# Patient Record
Sex: Female | Born: 1945 | Race: White | Hispanic: No | Smoking: Never smoker
Health system: Southern US, Community
[De-identification: ages and names within clinical notes are randomized; demographics above are authoritative.]

## PROBLEM LIST (undated history)

## (undated) DIAGNOSIS — A048 Other specified bacterial intestinal infections: Secondary | ICD-10-CM

## (undated) DIAGNOSIS — E119 Type 2 diabetes mellitus without complications: Secondary | ICD-10-CM

## (undated) DIAGNOSIS — H269 Unspecified cataract: Secondary | ICD-10-CM

## (undated) DIAGNOSIS — K862 Cyst of pancreas: Secondary | ICD-10-CM

## (undated) HISTORY — PX: ESOPHAGOGASTRODUODENOSCOPY: SHX1529

## (undated) HISTORY — DX: Type 2 diabetes mellitus without complications: E11.9

## (undated) HISTORY — DX: Unspecified cataract: H26.9

## (undated) HISTORY — PX: CHOLECYSTECTOMY: SHX55

## (undated) HISTORY — DX: Other specified bacterial intestinal infections: A04.8

## (undated) HISTORY — DX: Cyst of pancreas: K86.2

## (undated) HISTORY — PX: OTHER SURGICAL HISTORY: SHX169

## (undated) HISTORY — PX: PANCREATIC CYST EXCISION: SHX2157

---

## 2005-11-13 ENCOUNTER — Ambulatory Visit: Payer: Self-pay | Admitting: Family Medicine

## 2005-12-26 ENCOUNTER — Ambulatory Visit: Payer: Self-pay | Admitting: Family Medicine

## 2006-02-27 ENCOUNTER — Ambulatory Visit: Payer: Self-pay | Admitting: Family Medicine

## 2006-05-01 ENCOUNTER — Ambulatory Visit: Payer: Self-pay | Admitting: Family Medicine

## 2006-05-01 ENCOUNTER — Other Ambulatory Visit: Admission: RE | Admit: 2006-05-01 | Discharge: 2006-05-01 | Payer: Self-pay | Admitting: Family Medicine

## 2006-05-01 ENCOUNTER — Encounter: Payer: Self-pay | Admitting: Family Medicine

## 2006-05-01 LAB — CONVERTED CEMR LAB: Pap Smear: NORMAL

## 2006-06-26 DIAGNOSIS — M4150 Other secondary scoliosis, site unspecified: Secondary | ICD-10-CM | POA: Insufficient documentation

## 2006-08-24 ENCOUNTER — Encounter: Payer: Self-pay | Admitting: Family Medicine

## 2007-04-30 ENCOUNTER — Ambulatory Visit: Payer: Self-pay | Admitting: Family Medicine

## 2007-04-30 DIAGNOSIS — R141 Gas pain: Secondary | ICD-10-CM | POA: Insufficient documentation

## 2007-04-30 DIAGNOSIS — R143 Flatulence: Secondary | ICD-10-CM

## 2007-04-30 DIAGNOSIS — R142 Eructation: Secondary | ICD-10-CM

## 2007-05-14 ENCOUNTER — Encounter: Payer: Self-pay | Admitting: Family Medicine

## 2007-05-15 ENCOUNTER — Encounter: Payer: Self-pay | Admitting: Family Medicine

## 2007-05-16 DIAGNOSIS — B182 Chronic viral hepatitis C: Secondary | ICD-10-CM

## 2007-05-16 DIAGNOSIS — R748 Abnormal levels of other serum enzymes: Secondary | ICD-10-CM | POA: Insufficient documentation

## 2007-05-17 ENCOUNTER — Ambulatory Visit: Payer: Self-pay | Admitting: Family Medicine

## 2007-05-17 DIAGNOSIS — N183 Chronic kidney disease, stage 3 unspecified: Secondary | ICD-10-CM | POA: Insufficient documentation

## 2007-05-17 DIAGNOSIS — E119 Type 2 diabetes mellitus without complications: Secondary | ICD-10-CM | POA: Insufficient documentation

## 2007-05-17 LAB — CONVERTED CEMR LAB
ALT: 93 units/L — ABNORMAL HIGH (ref 0–35)
AST: 77 units/L — ABNORMAL HIGH (ref 0–37)
Alkaline Phosphatase: 88 units/L (ref 39–117)
BUN: 23 mg/dL (ref 6–23)
Calcium: 9.5 mg/dL (ref 8.4–10.5)
Creatinine, Ser: 1.36 mg/dL — ABNORMAL HIGH (ref 0.40–1.20)
HCV Ab: POSITIVE — AB
HDL: 58 mg/dL (ref >39)
Hepatitis B Surface Ag: NEGATIVE
LDL Cholesterol: 79 mg/dL (ref 0–99)
TSH: 3.008 microintl units/mL (ref 0.350–5.50)
Total Bilirubin: 0.9 mg/dL (ref 0.3–1.2)
Total CHOL/HDL Ratio: 2.7
VLDL: 20 mg/dL (ref 0–40)

## 2007-05-22 ENCOUNTER — Telehealth (INDEPENDENT_AMBULATORY_CARE_PROVIDER_SITE_OTHER): Payer: Self-pay | Admitting: *Deleted

## 2007-05-23 ENCOUNTER — Encounter: Payer: Self-pay | Admitting: Family Medicine

## 2007-05-23 ENCOUNTER — Telehealth: Payer: Self-pay | Admitting: Family Medicine

## 2007-05-23 LAB — CONVERTED CEMR LAB
Calcium, Total (PTH): 10 mg/dL (ref 8.4–10.5)
Creatinine, Urine: 125.4 mg/dL
Microalb Creat Ratio: 8 mg/g (ref 0.0–30.0)
PTH: 47.8 pg/mL (ref 14.0–72.0)

## 2007-05-28 ENCOUNTER — Ambulatory Visit: Payer: Self-pay | Admitting: Family Medicine

## 2007-06-04 ENCOUNTER — Telehealth: Payer: Self-pay | Admitting: Family Medicine

## 2007-06-05 ENCOUNTER — Encounter: Admission: RE | Admit: 2007-06-05 | Discharge: 2007-06-05 | Payer: Self-pay | Admitting: Family Medicine

## 2007-06-27 ENCOUNTER — Ambulatory Visit: Payer: Self-pay | Admitting: Family Medicine

## 2007-08-27 ENCOUNTER — Ambulatory Visit: Payer: Self-pay | Admitting: Family Medicine

## 2007-08-27 LAB — CONVERTED CEMR LAB: Hgb A1c MFr Bld: 6.9 %

## 2007-08-28 ENCOUNTER — Encounter: Admission: RE | Admit: 2007-08-28 | Discharge: 2007-08-29 | Payer: Self-pay | Admitting: Family Medicine

## 2007-11-25 ENCOUNTER — Ambulatory Visit: Payer: Self-pay | Admitting: Family Medicine

## 2007-11-25 LAB — CONVERTED CEMR LAB

## 2008-02-11 ENCOUNTER — Encounter: Payer: Self-pay | Admitting: Family Medicine

## 2008-02-25 ENCOUNTER — Ambulatory Visit: Payer: Self-pay | Admitting: Family Medicine

## 2008-02-25 LAB — CONVERTED CEMR LAB
Glucose, Bld: 113 mg/dL
Hgb A1c MFr Bld: 6.2 %

## 2008-04-28 ENCOUNTER — Telehealth: Payer: Self-pay | Admitting: Family Medicine

## 2008-06-09 ENCOUNTER — Ambulatory Visit: Payer: Self-pay | Admitting: Family Medicine

## 2008-06-16 ENCOUNTER — Encounter: Payer: Self-pay | Admitting: Family Medicine

## 2008-06-17 LAB — CONVERTED CEMR LAB
ALT: 11 units/L (ref 0–35)
AST: 14 units/L (ref 0–37)
Albumin: 4.5 g/dL (ref 3.5–5.2)
Alkaline Phosphatase: 79 units/L (ref 39–117)
BUN: 28 mg/dL — ABNORMAL HIGH (ref 6–23)
Creatinine, Ser: 1.25 mg/dL — ABNORMAL HIGH (ref 0.40–1.20)
HDL: 60 mg/dL (ref >39)
LDL Cholesterol: 108 mg/dL — ABNORMAL HIGH (ref 0–99)
Potassium: 4.8 meq/L (ref 3.5–5.3)
TSH: 2.198 microintl units/mL (ref 0.350–4.50)
Total CHOL/HDL Ratio: 3.1

## 2008-07-15 ENCOUNTER — Telehealth: Payer: Self-pay | Admitting: Family Medicine

## 2008-07-17 ENCOUNTER — Ambulatory Visit: Payer: Self-pay | Admitting: Family Medicine

## 2008-07-27 ENCOUNTER — Encounter: Payer: Self-pay | Admitting: Family Medicine

## 2008-08-04 ENCOUNTER — Encounter: Payer: Self-pay | Admitting: Family Medicine

## 2008-08-27 ENCOUNTER — Ambulatory Visit: Payer: Self-pay | Admitting: Family Medicine

## 2008-08-27 LAB — CONVERTED CEMR LAB: Hgb A1c MFr Bld: 5.8 %

## 2008-09-01 ENCOUNTER — Encounter: Payer: Self-pay | Admitting: Family Medicine

## 2008-09-04 ENCOUNTER — Encounter: Payer: Self-pay | Admitting: Family Medicine

## 2008-12-02 ENCOUNTER — Telehealth: Payer: Self-pay | Admitting: Family Medicine

## 2008-12-07 ENCOUNTER — Encounter: Payer: Self-pay | Admitting: Family Medicine

## 2008-12-08 LAB — CONVERTED CEMR LAB
HDL: 66 mg/dL (ref >39)
LDL Cholesterol: 82 mg/dL (ref 0–99)
Total CHOL/HDL Ratio: 2.7
Triglycerides: 161 mg/dL — ABNORMAL HIGH (ref <150)
VLDL: 32 mg/dL (ref 0–40)

## 2009-02-26 ENCOUNTER — Ambulatory Visit: Payer: Self-pay | Admitting: Family Medicine

## 2009-02-27 ENCOUNTER — Encounter: Payer: Self-pay | Admitting: Family Medicine

## 2009-03-01 LAB — CONVERTED CEMR LAB
ALT: 12 units/L (ref 0–35)
BUN: 30 mg/dL — ABNORMAL HIGH (ref 6–23)
CO2: 20 meq/L (ref 19–32)
Calcium: 9.5 mg/dL (ref 8.4–10.5)
Chloride: 106 meq/L (ref 96–112)
Creatinine, Ser: 1.49 mg/dL — ABNORMAL HIGH (ref 0.40–1.20)
Glucose, Bld: 85 mg/dL (ref 70–99)

## 2009-03-15 ENCOUNTER — Encounter: Admission: RE | Admit: 2009-03-15 | Discharge: 2009-03-15 | Payer: Self-pay | Admitting: Family Medicine

## 2009-07-13 ENCOUNTER — Ambulatory Visit: Payer: Self-pay | Admitting: Family Medicine

## 2009-09-07 ENCOUNTER — Ambulatory Visit: Payer: Self-pay | Admitting: Family Medicine

## 2009-09-07 LAB — CONVERTED CEMR LAB: Hgb A1c MFr Bld: 6.5 %

## 2009-10-05 ENCOUNTER — Encounter: Payer: Self-pay | Admitting: Family Medicine

## 2009-11-03 ENCOUNTER — Telehealth (INDEPENDENT_AMBULATORY_CARE_PROVIDER_SITE_OTHER): Payer: Self-pay | Admitting: *Deleted

## 2009-12-09 ENCOUNTER — Ambulatory Visit: Payer: Self-pay | Admitting: Family Medicine

## 2010-01-20 ENCOUNTER — Encounter: Payer: Self-pay | Admitting: Family Medicine

## 2010-01-21 LAB — CONVERTED CEMR LAB
ALT: 12 units/L (ref 0–35)
AST: 17 units/L (ref 0–37)
Calcium: 9.7 mg/dL (ref 8.4–10.5)
Chloride: 106 meq/L (ref 96–112)
Creatinine, Ser: 1.2 mg/dL (ref 0.40–1.20)
Potassium: 4.8 meq/L (ref 3.5–5.3)
Sodium: 135 meq/L (ref 135–145)
Total CHOL/HDL Ratio: 2.3

## 2010-02-25 ENCOUNTER — Ambulatory Visit: Payer: Self-pay | Admitting: Family Medicine

## 2010-03-17 ENCOUNTER — Ambulatory Visit: Payer: Self-pay | Admitting: Family Medicine

## 2010-03-18 LAB — CONVERTED CEMR LAB
Glucose, Bld: 104 mg/dL — ABNORMAL HIGH (ref 70–99)
Potassium: 4.7 meq/L (ref 3.5–5.3)
Sodium: 141 meq/L (ref 135–145)

## 2010-04-14 ENCOUNTER — Ambulatory Visit: Payer: Self-pay | Admitting: Family Medicine

## 2010-04-14 LAB — CONVERTED CEMR LAB
Creatinine,U: 300 mg/dL
Hgb A1c MFr Bld: 6.2 %
Microalbumin U total vol: 80 mg/L

## 2010-07-14 ENCOUNTER — Ambulatory Visit: Payer: Self-pay | Admitting: Family Medicine

## 2010-07-14 LAB — CONVERTED CEMR LAB: Hgb A1c MFr Bld: 6.4 %

## 2010-07-20 ENCOUNTER — Ambulatory Visit: Payer: Self-pay | Admitting: Family Medicine

## 2010-10-10 ENCOUNTER — Encounter: Payer: Self-pay | Admitting: Family Medicine

## 2010-10-11 ENCOUNTER — Encounter: Payer: Self-pay | Admitting: Family Medicine

## 2010-10-18 NOTE — Assessment & Plan Note (Signed)
Summary: BP better  Nurse Visit   Vital Signs:  Patient profile:   65 year old female O2 Sat:      98 % on Room air Pulse rate:   78 / minute BP sitting:   117 / 68  (left arm) Cuff size:   regular  Vitals Entered By: Payton Spark CMA (March 17, 2010 10:07 AM)  O2 Flow:  Room air CC: Blood pressure check and BMP   Allergies: 1)  ! Sulfa 2)  ! * Aleve 3)  ! * Contrast Dye  Orders Added: 1)  T-Basic Metabolic Panel [[id number]] 2)  Est. Patient Level I [14431]   Pt is feeling better.

## 2010-10-18 NOTE — Assessment & Plan Note (Signed)
Summary: LIGHT HEADED/   Vital Signs:  Patient profile:   65 year old female Height:      60 inches Weight:      152 pounds BMI:     29.79 O2 Sat:      97 % on Room air Temp:     97.6 degrees F oral Pulse rate:   88 / minute Pulse (ortho):   88 / minute BP sitting:   98 / 66  (left arm) BP standing:   100 / 74 Cuff size:   large  Vitals Entered By: Payton Spark CMA (February 25, 2010 1:49 PM)  O2 Flow:  Room air  Serial Vital Signs/Assessments:  Time      Position  BP       Pulse  Resp  Temp     By 1:51 PM   Lying RA  96/72    90                    Payton Spark CMA 1:51 PM   Sitting   102/72   88                    Payton Spark CMA 1:51 PM   Standing  100/74   88                    Payton Spark CMA  CC: Was at worked and suddenly got blurred vision and felt like she was going to pass out but was able to sit down. She is feeling better now.    Primary Care Provider:  Nani Gasser MD  CC:  Was at worked and suddenly got blurred vision and felt like she was going to pass out but was able to sit down. She is feeling better now. .  History of Present Illness: 65 yo WF presents for feeling lightheaded while at lunch today about 12:30.  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  She sat down and drank some water and ate a mint.  Her son came to pick her up and she was was clammy.  She is feeling better now.  She reports feeling OK prior to this.  She is not on any new medicine.  She had some diarrhea yesterday after eating some chicken out.  She has not had any vomitting.    She is on Lisionopril, Metformin and a statin.  She runs AM fastings <120.  Denies any lows.       Allergies: 1)  ! Sulfa 2)  ! * Aleve 3)  ! * Contrast Dye  Review of Systems      See HPI  Physical Exam  General:  alert, well-developed, well-nourished, and well-hydrated.   Head:  normocephalic and atraumatic.   Eyes:  pupils equal, pupils round,  and pupils reactive to light.   Nose:  no nasal discharge.   Mouth:  pharynx pink and moist.   Neck:  no masses.  no audible carotid bruits Lungs:  Normal respiratory effort, chest expands symmetrically. Lungs are clear to auscultation, no crackles or wheezes. Heart:  Normal rate and regular rhythm. S1 and S2 normal without gallop, murmur, click, rub or other extra sounds. Extremities:  no LE edema Neurologic:  gait normal.  no tremor Skin:  color normal.   Psych:  good eye contact, not anxious appearing, and not depressed appearing.     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  She is feeling better now after drinking a bottle of water.  I am going to stop her Lisinopril given her low BP readings and bring her back for a nurse BP check in 3 wks.  Improve fluid intake.    Complete Medication List: 1)  Glucometer  .... With #100 strips and lancets. 2)  Metformin Hcl 500 Mg Tabs (Metformin hcl) .Marland Kitchen.. 1 tab by mouth two times a day 3)  One Touch Ultra Test Strips  .... Use twice daily as directed 4)  Pravachol 20 Mg Tabs (Pravastatin sodium) .... Take 1 tablet by mouth once a day at bedtime 5)  Fish Oil 1000 Mg Caps (Omega-3 fatty acids) .... 2 tabs by mouth daily.  Patient Instructions: 1)  Stop Lisinopril. 2)  Drink more water or Crystal light. 3)  Return for a nurse visit BP check in 3 wks, repeat BMP.

## 2010-10-18 NOTE — Progress Notes (Signed)
Summary: OTC Mucinex  Phone Note Call from Patient   Caller: Patient Summary of Call: Pt called wanting to know what OTC med would be ok to take. Dr. Cathey Endow advised plain Mucinex. Pt aware and agrees Initial call taken by: Payton Spark CMA,  November 03, 2009 4:50 PM

## 2010-10-18 NOTE — Assessment & Plan Note (Signed)
Summary: 3 MONTH FU DM, HTN   Vital Signs:  Patient profile:   65 year old female Height:      60 inches Weight:      150.50 pounds BMI:     29.50 Pulse rate:   91 / minute Pulse rhythm:   regular BP sitting:   116 / 73  (left arm) Cuff size:   large  Vitals Entered By: Mervin Kung CMA (December 09, 2009 4:07 PM) CC: room 5  3 month follow up of diabetes. Pt has no new concerns. High bs--146,  low bs--106, avg bs--120   Primary Care Provider:  Nani Gasser MD  CC:  room 5  3 month follow up of diabetes. Pt has no new concerns. High bs--146, low bs--106, and avg bs--120.  History of Present Illness: room 5:  3 month follow up of diabetes. Pt has no new concerns. High bs--146,  low bs--106, avg bs--120.    Diabetes Management History:      The patient is a 65 years old female who comes in for evaluation of DM Type 2.  She is (or has been) enrolled in the "Diabetic Education Program".  She states understanding of dietary principles but she is not following the appropriate diet.  No sensory loss is reported.  Self foot exams are being performed.  She is checking home blood sugars.  She says that she is not exercising regularly.        Hypoglycemic symptoms are not occurring.  No hyperglycemic symptoms are reported.        There are no symptoms to suggest diabetic complications.  Since her last visit, no infections have occurred.  No changes have been made to her treatment plan since last visit.    Allergies: 1)  ! Sulfa 2)  ! * Aleve 3)  ! * Contrast Dye  Physical Exam  General:  Well-developed,well-nourished,in no acute distress; alert,appropriate and cooperative throughout examination Head:  Normocephalic and atraumatic without obvious abnormalities. No apparent alopecia or balding. Lungs:  Normal respiratory effort, chest expands symmetrically. Lungs are clear to auscultation, no crackles or wheezes. Heart:  Normal rate and regular rhythm. S1 and S2 normal without  gallop, murmur, click, rub or other extra sounds. Skin:  no rashes.   Cervical Nodes:  No lymphadenopathy noted Psych:  Cognition and judgment appear intact. Alert and cooperative with normal attention span and concentration. No apparent delusions, illusions, hallucinations   Impression & Recommendations:  Problem # 1:  DIABETES-TYPE 2 (ICD-250.00) REminded to get eye exam.  A1C is 6.5 today.   Due for cholesterol screen and check on Cr.  Her updated medication list for this problem includes:    Lisinopril 10 Mg Tabs (Lisinopril) .Marland Kitchen... Take 1 tablet by mouth once a day    Metformin Hcl 500 Mg Tabs (Metformin hcl) .Marland Kitchen... 1 tab by mouth two times a day  Orders: Fingerstick (11914) Hemoglobin A1C (83036) T-Lipid Profile ([id number])  Problem # 2:  HYPERTENSION, BENIGN SYSTEMIC (ICD-401.1) Looks great!Marland Kitchen  aT goal. Due fore labs.  Her updated medication list for this problem includes:    Lisinopril 10 Mg Tabs (Lisinopril) .Marland Kitchen... Take 1 tablet by mouth once a day  Orders: T-Lipid Profile ([id number]) T-Comprehensive Metabolic Panel ([id number])  Complete Medication List: 1)  Lisinopril 10 Mg Tabs (Lisinopril) .... Take 1 tablet by mouth once a day 2)  Glucometer  .... With #100 strips and lancets. 3)  Metformin Hcl 500 Mg Tabs (Metformin hcl) .Marland Kitchen.. 1 tab  by mouth two times a day 4)  One Touch Ultra Test Strips  .... Use twice daily as directed 5)  Pravachol 20 Mg Tabs (Pravastatin sodium) .... Take 1 tablet by mouth once a day at bedtime 6)  Fish Oil 1000 Mg Caps (Omega-3 fatty acids) .... 2 tabs by mouth daily.  Diabetes Management Assessment/Plan:      The following lipid goals have been established for the patient: Total cholesterol goal of 200; LDL cholesterol goal of 100; HDL cholesterol goal of 40; Triglyceride goal of 150.  Her blood pressure goal is < 130/80.    Patient Instructions: 1)  Please schedule a follow-up appointment in 4 months for diabetes.   Prescriptions: PRAVACHOL 20 MG TABS (PRAVASTATIN SODIUM) Take 1 tablet by mouth once a day at bedtime  #90 x 3   Entered and Authorized by:   Nani Gasser MD   Signed by:   Nani Gasser MD on 12/09/2009   Method used:   Electronically to        [facility] [phone]* (retail)       [address] [place], [place]  [address]       Ph: [phone]       Fax: [phone]   RxID:   [id number] METFORMIN HCL 500 MG  TABS (METFORMIN HCL) 1 tab by mouth two times a day  #90 Each x 3   Entered and Authorized by:   Nani Gasser MD   Signed by:   Nani Gasser MD on 12/09/2009   Method used:   Electronically to        [facility] [phone]* (retail)       [address] [place], [place]  [address]       Ph: [phone]       Fax: [phone]   RxID:   [id number] LISINOPRIL 10 MG TABS (LISINOPRIL) Take 1 tablet by mouth once a day  #90 x 3   Entered and Authorized by:   Nani Gasser MD   Signed by:   Nani Gasser MD on 12/09/2009   Method used:   Electronically to        [facility] [phone]* (retail)       [address] [place], [place]  [address]       Ph: [phone]       Fax: [phone]   RxID:   [id number]   Current Allergies (reviewed today): ! SULFA ! * ALEVE ! * CONTRAST DYE

## 2010-10-18 NOTE — Assessment & Plan Note (Signed)
Summary: 4 MONTH FU DM   Vital Signs:  Patient profile:   65 year old female Height:      60 inches Weight:      154 pounds Pulse rate:   72 / minute BP sitting:   128 / 78  (left arm) Cuff size:   regular  Vitals Entered By: Avon Gully CMA, Duncan Dull) (April 14, 2010 1:03 PM) CC: diabetes f/u, Lipid Management Is Patient Diabetic? Yes   CC:  diabetes f/u and Lipid Management.  Diabetes Management History:      The patient is a 65 years old female who comes in for evaluation of DM Type 2.  She is (or has been) enrolled in the "Diabetic Education Program".  She states understanding of dietary principles and is following her diet appropriately.  No sensory loss is reported.  Self foot exams are being performed.  She is checking home blood sugars.  She says that she is not exercising regularly.        Hypoglycemic symptoms are not occurring.  No hyperglycemic symptoms are reported.  Other comments include: Highest sugar 150 in the last 2 months.  Averaging 120. Marland Kitchen        There are no symptoms to suggest diabetic complications.  Since her last visit, no infections have occurred.  No changes have been made to her treatment plan since last visit.    Lipid Management History:      Positive NCEP/ATP III risk factors include female age 65 years old or older, diabetes, and hypertension.  Negative NCEP/ATP III risk factors include HDL cholesterol greater than 60 and non-tobacco-user status.    Current Medications (verified): 1)  Glucometer .... With #100 Strips and Lancets. 2)  One Touch Ultra Test Strips .... Use Twice Daily As Directed 3)  Pravachol 20 Mg Tabs (Pravastatin Sodium) .... Take 1 Tablet By Mouth Once A Day At Bedtime 4)  Fish Oil 1000 Mg Caps (Omega-3 Fatty Acids) .... 2 Tabs By Mouth Daily. 5)  Glimepiride 2 Mg Tabs (Glimepiride) .Marland Kitchen.. 1 Tab By Mouth Qday With Breakfast  Allergies (verified): 1)  ! Sulfa 2)  ! * Aleve 3)  ! * Contrast Dye  Comments:  Nurse/Medical  Assistant: The patient's medications and allergies were reviewed with the patient and were updated in the Medication and Allergy Lists. Avon Gully CMA, Duncan Dull) (April 14, 2010 1:03 PM)  Physical Exam  General:  Well-developed,well-nourished,in no acute distress; alert,appropriate and cooperative throughout examination Lungs:  Normal respiratory effort, chest expands symmetrically. Lungs are clear to auscultation, no crackles or wheezes. Heart:  Normal rate and regular rhythm. S1 and S2 normal without gallop, murmur, click, rub or other extra sounds. Skin:  no rashes.   Psych:  Cognition and judgment appear intact. Alert and cooperative with normal attention span and concentration. No apparent delusions, illusions, hallucinations  Diabetes Management Exam:    Foot Exam (with socks and/or shoes not present):       Sensory-Monofilament:          Left foot: normal          Right foot: normal       Inspection:          Left foot: normal          Right foot: normal       Nails:          Left foot: normal          Right foot: normal   Impression &  Recommendations:  Problem # 1:  DIABETES-TYPE 2 (ICD-250.00) Doing well. Peeing more since off the morning. No dysuria.   A1C if fabulous. Recheck 3 months since off the metforming for CKD.    Since has proteinuria need to restart ACEi. Will start lower dose to avoid hypotension.   Her updated medication list for this problem includes:    Glimepiride 2 Mg Tabs (Glimepiride) .Marland Kitchen... 1 tab by mouth qday with breakfast    Lisinopril 5 Mg Tabs (Lisinopril) .Marland Kitchen... Take 1 tablet by mouth once a day  Orders: Fingerstick (04540) Creatinine  (98119) Urine Microalbumin (82044) Hemoglobin A1C (83036)  Problem # 2:  HYPERTENSION, BENIGN SYSTEMIC (ICD-401.1)  Looks great today off the medication. No more dizzy spells.    BP today: 128/78 Prior BP: 117/68 (03/17/2010)  Prior 10 Yr Risk Heart Disease: 8 % (05/28/2007)  Labs Reviewed: K+: 4.7  (03/17/2010) Creat: : 1.22 (03/17/2010)   Chol: 147 (01/20/2010)   HDL: 63 (01/20/2010)   LDL: 60 (01/20/2010)   TG: 122 (01/20/2010)  Her updated medication list for this problem includes:    Lisinopril 5 Mg Tabs (Lisinopril) .Marland Kitchen... Take 1 tablet by mouth once a day  Complete Medication List: 1)  Glucometer  .... With #100 strips and lancets. 2)  One Touch Ultra Test Strips  .... Use twice daily as directed 3)  Pravachol 20 Mg Tabs (Pravastatin sodium) .... Take 1 tablet by mouth once a day at bedtime 4)  Fish Oil 1000 Mg Caps (Omega-3 fatty acids) .... 2 tabs by mouth daily. 5)  Glimepiride 2 Mg Tabs (Glimepiride) .Marland Kitchen.. 1 tab by mouth qday with breakfast 6)  Lisinopril 5 Mg Tabs (Lisinopril) .... Take 1 tablet by mouth once a day  Diabetes Management Assessment/Plan:      The following lipid goals have been established for the patient: Total cholesterol goal of 200; LDL cholesterol goal of 100; HDL cholesterol goal of 40; Triglyceride goal of 150.  Her blood pressure goal is < 130/80.    Lipid Assessment/Plan:      Based on NCEP/ATP III, the patient's risk factor category is "history of diabetes".  The patient's lipid goals are as follows: Total cholesterol goal is 200; LDL cholesterol goal is 100; HDL cholesterol goal is 40; Triglyceride goal is 150.     Patient Instructions: 1)  Try to get your eye exam this year.  2)  Please schedule a follow-up appointment in 3 months for diabetes.  Prescriptions: LISINOPRIL 5 MG TABS (LISINOPRIL) Take 1 tablet by mouth once a day  #90 x 3   Entered and Authorized by:   Nani Gasser MD   Signed by:   Nani Gasser MD on 04/14/2010   Method used:   Electronically to        [facility] [phone]* (retail)       [address] [place], [place]  [address]       Ph: [phone]       Fax: [phone]   RxID:   [id number] GLIMEPIRIDE 2 MG TABS (GLIMEPIRIDE) 1 tab by mouth qday with breakfast  #90 x 2   Entered and Authorized by:    Nani Gasser MD   Signed by:   Nani Gasser MD on 04/14/2010   Method used:   Electronically to        [facility] [phone]* (retail)       [address]  [place], [place]  [address]       Ph: [phone]       Fax: [phone]   RxID:   [id number]   Laboratory Results   Urine Tests  Date/Time Received: 04/14/10 Date/Time Reported: 04/14/10  Microalbumin (urine): 80 mg/L Creatinine: 300mg /dL  A:C Ratio 22-025 mg/g  Blood Tests   Date/Time Received: 04/14/10 Date/Time Reported: 04/14/10  HGBA1C: 6.2%   (Normal Range: Non-Diabetic - 3-6%   Control Diabetic - 6-8%)

## 2010-10-18 NOTE — Assessment & Plan Note (Signed)
Summary: shingles injection-jr  Nurse Visit   Allergies: 1)  ! Sulfa 2)  ! * Aleve 3)  ! * Contrast Dye  Immunizations Administered:  Zostavax # 1:    Vaccine Type: Zostavax    Site: right deltoid    Mfr: Merck    Dose: 0.5 ml    Route: IM    Given by: Sue Lush McCrimmon CMA, (AAMA)    Exp. Date: 05/06/2011    Lot #: [id number]    VIS given: 06/30/05 given July 20, 2010.  Orders Added: 1)  Zoster (Shingles) Vaccine Live [90736] 2)  Admin 1st Vaccine [phone]

## 2010-10-18 NOTE — Letter (Signed)
Summary: [facility]  [facility]   Imported By: Lanelle Bal 10/12/2009 10:38:30  _____________________________________________________________________  External Attachment:    Type:   Image     Comment:   External Document

## 2010-10-18 NOTE — Assessment & Plan Note (Signed)
Summary: 3 mo. f/u DM   Vital Signs:  Patient profile:   65 year old female Height:      60 inches Weight:      158 pounds Pulse rate:   74 / minute BP sitting:   114 / 78  (right arm) Cuff size:   regular  Vitals Entered By: Avon Gully CMA, Duncan Dull) (July 14, 2010 9:43 AM) CC: f/u dm   CC:  f/u dm.  Diabetes Management History:      The patient is a 65 years old female who comes in for evaluation of DM Type 2.  She is (or has been) enrolled in the "Diabetic Education Program".  She states understanding of dietary principles but she is not following the appropriate diet.  She is checking home blood sugars.  She says that she is not exercising regularly.        Hypoglycemic symptoms are not occurring.  No hyperglycemic symptoms are reported.  Other comments include: Has been socially stressed.  admist her diet was poor. Marland Kitchen        Her home blood sugars include fasting blood sugars: highest: 124, average: 98.    Current Medications (verified): 1)  Glucometer .... With #100 Strips and Lancets. 2)  One Touch Ultra Test Strips .... Use Twice Daily As Directed 3)  Pravachol 20 Mg Tabs (Pravastatin Sodium) .... Take 1 Tablet By Mouth Once A Day At Bedtime 4)  Fish Oil 1000 Mg Caps (Omega-3 Fatty Acids) .... 2 Tabs By Mouth Daily. 5)  Glimepiride 2 Mg Tabs (Glimepiride) .Marland Kitchen.. 1 Tab By Mouth Qday With Breakfast 6)  Lisinopril 5 Mg Tabs (Lisinopril) .... Take 1 Tablet By Mouth Once A Day  Allergies (verified): 1)  ! Sulfa 2)  ! * Aleve 3)  ! * Contrast Dye  Comments:  Nurse/Medical Assistant: The patient's medications and allergies were reviewed with the patient and were updated in the Medication and Allergy Lists. Avon Gully CMA, Duncan Dull) (July 14, 2010 9:43 AM)  Physical Exam  General:  Well-developed,well-nourished,in no acute distress; alert,appropriate and cooperative throughout examination Head:  Normocephalic and atraumatic without obvious abnormalities. No  apparent alopecia or balding. Lungs:  Normal respiratory effort, chest expands symmetrically. Lungs are clear to auscultation, no crackles or wheezes. Heart:  Normal rate and regular rhythm. S1 and S2 normal without gallop, murmur, click, rub or other extra sounds. No carotid bruits.  Extremities:  1+ edema on the left foot and ankle. Trace on the right.  Skin:  no rashes.   Cervical Nodes:  No lymphadenopathy noted Psych:  Cognition and judgment appear intact. Alert and cooperative with normal attention span and concentration. No apparent delusions, illusions, hallucinations   Impression & Recommendations:  Problem # 1:  DIABETES-TYPE 2 (ICD-250.00) Reminded to get her eye exam SDsicussed shingles vaccines.  Her updated medication list for this problem includes:    Glimepiride 2 Mg Tabs (Glimepiride) .Marland Kitchen... 1 tab by mouth qday with breakfast    Lisinopril 5 Mg Tabs (Lisinopril) .Marland Kitchen... Take 1 tablet by mouth once a day  Orders: Fingerstick (08657) Hemoglobin A1C (83036)  Labs Reviewed: Creat: 1.22 (03/17/2010)   Microalbumin: 80 (04/14/2010)  Last Eye Exam: normal (06/18/2008) Reviewed HgBA1c results: 6.4 (07/14/2010)  6.2 (04/14/2010)  Complete Medication List: 1)  Glucometer  .... With #100 strips and lancets. 2)  One Touch Ultra Test Strips  .... Use twice daily as directed 3)  Pravachol 20 Mg Tabs (Pravastatin sodium) .... Take 1 tablet by mouth  once a day at bedtime 4)  Fish Oil 1000 Mg Caps (Omega-3 fatty acids) .... 2 tabs by mouth daily. 5)  Glimepiride 2 Mg Tabs (Glimepiride) .Marland Kitchen.. 1 tab by mouth qday with breakfast 6)  Lisinopril 5 Mg Tabs (Lisinopril) .... Take 1 tablet by mouth once a day  Diabetes Management Assessment/Plan:      The following lipid goals have been established for the patient: Total cholesterol goal of 200; LDL cholesterol goal of 100; HDL cholesterol goal of 40; Triglyceride goal of 150.  Her blood pressure goal is < 130/80.    Patient  Instructions: 1)  Remember to get your eye exam.  2)  Please schedule a follow-up appointment in 4 months  for diabetes.    Orders Added: 1)  Fingerstick [36416] 2)  Hemoglobin A1C [83036] 3)  Est. Patient Level III [36644]   Immunization History:  Influenza Immunization History:    Influenza:  historical (06/20/2010)   Immunization History:  Influenza Immunization History:    Influenza:  Historical (06/20/2010)  Laboratory Results   Blood Tests   Date/Time Received: 07/14/10 Date/Time Reported: 07/14/10  HGBA1C: 6.4%   (Normal Range: Non-Diabetic - 3-6%   Control Diabetic - 6-8%)         Immunization History:  Influenza Immunization History:    Influenza:  historical (06/20/2010)

## 2010-11-03 NOTE — Letter (Signed)
Summary: [facility]  [facility]   Imported By: Maryln Gottron 10/27/2010 12:56:39  _____________________________________________________________________  External Attachment:    Type:   Image     Comment:   External Document

## 2010-11-14 ENCOUNTER — Ambulatory Visit (INDEPENDENT_AMBULATORY_CARE_PROVIDER_SITE_OTHER): Payer: BC Managed Care – PPO | Admitting: Family Medicine

## 2010-11-14 ENCOUNTER — Encounter: Payer: Self-pay | Admitting: Family Medicine

## 2010-11-14 DIAGNOSIS — E119 Type 2 diabetes mellitus without complications: Secondary | ICD-10-CM

## 2010-11-14 DIAGNOSIS — R141 Gas pain: Secondary | ICD-10-CM

## 2010-11-21 ENCOUNTER — Other Ambulatory Visit: Payer: Self-pay | Admitting: Family Medicine

## 2010-11-21 ENCOUNTER — Encounter: Payer: Self-pay | Admitting: Family Medicine

## 2010-11-21 DIAGNOSIS — Z139 Encounter for screening, unspecified: Secondary | ICD-10-CM

## 2010-11-22 LAB — CONVERTED CEMR LAB
ALT: 37 units/L — ABNORMAL HIGH (ref 0–35)
AST: 44 units/L — ABNORMAL HIGH (ref 0–37)
Alkaline Phosphatase: 84 units/L (ref 39–117)
Chloride: 107 meq/L (ref 96–112)
Creatinine, Ser: 1.01 mg/dL (ref 0.40–1.20)
Total Bilirubin: 0.5 mg/dL (ref 0.3–1.2)

## 2010-11-24 NOTE — Assessment & Plan Note (Signed)
Summary: f/u diabetes, etc   Vital Signs:  Patient profile:   65 year old female Height:      60 inches Weight:      162 pounds Pulse rate:   76 / minute BP sitting:   122 / 75  (right arm) Cuff size:   regular  Vitals Entered By: Avon Gully CMA, Duncan Dull) (November 14, 2010 10:30 AM) CC: f/u DM   Primary Care Provider:  Nani Gasser MD  CC:  f/u DM.  History of Present Illness: Here to f/u DM. No compalints. Last labs in May. Had normla cholesterol then.    Diabetes Management History:      The patient is a 65 years old female who comes in for evaluation of DM Type 2.  She is (or has been) enrolled in the "Diabetic Education Program".  She states understanding of dietary principles and is following her diet appropriately.  No sensory loss is reported.  Self foot exams are being performed.  She is checking home blood sugars.  She says that she is not exercising regularly.        Hypoglycemic symptoms are not occurring.  No hyperglycemic symptoms are reported.  Other comments include: Says has her eye appt scheduled this month.  .        There are no symptoms to suggest diabetic complications.  Since her last visit, no infections have occurred.  No changes have been made to her treatment plan since last visit.    Current Medications (verified): 1)  Glucometer .... With #100 Strips and Lancets. 2)  One Touch Ultra Test Strips .... Use Twice Daily As Directed 3)  Pravachol 20 Mg Tabs (Pravastatin Sodium) .... Take 1 Tablet By Mouth Once A Day At Bedtime 4)  Fish Oil 1000 Mg Caps (Omega-3 Fatty Acids) .... 2 Tabs By Mouth Daily. 5)  Glimepiride 2 Mg Tabs (Glimepiride) .Marland Kitchen.. 1 Tab By Mouth Qday With Breakfast 6)  Lisinopril 5 Mg Tabs (Lisinopril) .... Take 1 Tablet By Mouth Once A Day  Allergies (verified): 1)  ! Sulfa 2)  ! * Aleve 3)  ! * Contrast Dye  Comments:  Nurse/Medical Assistant: The patient's medications and allergies were reviewed with the patient and  were updated in the Medication and Allergy Lists. Avon Gully CMA, Duncan Dull) (November 14, 2010 10:31 AM)  Social History: Reviewed history from 08/27/2008 and no changes required. Never smoke, no EtoH.  Working at [employer].    Physical Exam  General:  Well-developed,well-nourished,in no acute distress; alert,appropriate and cooperative throughout examination Head:  Normocephalic and atraumatic without obvious abnormalities. No apparent alopecia or balding. Lungs:  Normal respiratory effort, chest expands symmetrically. Lungs are clear to auscultation, no crackles or wheezes. Heart:  Normal rate and regular rhythm. S1 and S2 normal without gallop, murmur, click, rub or other extra sounds. Extremities:  NO LE edema.    Impression & Recommendations:  Problem # 1:  DIABETES-TYPE 2 (ICD-250.00)  Exam was normal today. Did encouraged some regular exercise as she has gained weight.  She has eye appt scheduled.  Exam is normal Continue current regimen. F/U in 4 months.  Her updated medication list for this problem includes:    Glimepiride 2 Mg Tabs (Glimepiride) .Marland Kitchen... 1 tab by mouth qday with breakfast    Lisinopril 5 Mg Tabs (Lisinopril) .Marland Kitchen... Take 1 tablet by mouth once a day  Orders: Fingerstick (36416) Hemoglobin A1C (04540) T-Comprehensive Metabolic Panel ([id number])  Labs Reviewed: Creat: 1.22 (03/17/2010)  Microalbumin: 80 (04/14/2010)  Last Eye Exam: normal (06/18/2008) Reviewed HgBA1c results: 6.4 (07/14/2010)  6.2 (04/14/2010)  Problem # 2:  KIDNEY DISEASE, CHRONIC, STAGE III (ICD-585.3) Due to recheck function.   Orders: T-Comprehensive Metabolic Panel ([id number])  Problem # 3:  ABDOMINAL BLOATING (ICD-787.3) Pt c/o of bloating and Dr. Flonnie Hailstone at [facility] recommend see GI if her sxs persists. Offered to schedule her but she is wanting to hold off on this for right now. She will call if sxs persist.   Complete Medication List: 1)  Glucometer  .... With #100 strips  and lancets. 2)  One Touch Ultra Test Strips  .... Use twice daily as directed 3)  Pravachol 20 Mg Tabs (Pravastatin sodium) .... Take 1 tablet by mouth once a day at bedtime 4)  Fish Oil 1000 Mg Caps (Omega-3 fatty acids) .... 2 tabs by mouth daily. 5)  Glimepiride 2 Mg Tabs (Glimepiride) .Marland Kitchen.. 1 tab by mouth qday with breakfast 6)  Lisinopril 5 Mg Tabs (Lisinopril) .... Take 1 tablet by mouth once a day  Other Orders: T-Mammography Bilateral Screening (13086)  Diabetes Management Assessment/Plan:      The following lipid goals have been established for the patient: Total cholesterol goal of 200; LDL cholesterol goal of 100; HDL cholesterol goal of 40; Triglyceride goal of 150.  Her blood pressure goal is < 130/80.    Patient Instructions: 1)  Please schedule a follow-up appointment in 4 months for diabetes .    Orders Added: 1)  Fingerstick [36416] 2)  Hemoglobin A1C [83036] 3)  T-Comprehensive Metabolic Panel [[id number]] 4)  T-Mammography Bilateral Screening [77057] 5)  Est. Patient Level III [57846]    Laboratory Results   Blood Tests   Date/Time Received: 11/14/10 Date/Time Reported: 10/2710       Appended Document: f/u diabetes, etc    Clinical Lists Changes  Observations: Added new observation of HGBA1C: 6.5 % (11/14/2010 12:19)      Laboratory Results   Blood Tests   Date/Time Received: 11/14/10  HGBA1C: 6.5%   (Normal Range: Non-Diabetic - 3-6%   Control Diabetic - 6-8%)

## 2010-11-29 ENCOUNTER — Ambulatory Visit
Admission: RE | Admit: 2010-11-29 | Discharge: 2010-11-29 | Disposition: A | Discharge status: Home / Self Care | Payer: BC Managed Care – PPO | Source: Ambulatory Visit | Attending: Family Medicine | Admitting: Family Medicine

## 2010-11-29 DIAGNOSIS — Z139 Encounter for screening, unspecified: Secondary | ICD-10-CM

## 2011-01-11 ENCOUNTER — Other Ambulatory Visit: Payer: Self-pay | Admitting: Family Medicine

## 2011-02-08 ENCOUNTER — Telehealth: Payer: Self-pay | Admitting: Family Medicine

## 2011-02-08 NOTE — Telephone Encounter (Signed)
Pt called and wants to know if it is time for her labs to be done.   Plan:  Reviewed last lab results and the provider wanted pt to repeat LFT's in 8 weeks and it was last done in 03-12.  Told pt it was time to get done.  Transferred to front office to get set up for nurse visit. Jarvis Newcomer, LPN Domingo Dimes

## 2011-02-09 ENCOUNTER — Other Ambulatory Visit: Payer: BC Managed Care – PPO

## 2011-02-09 DIAGNOSIS — R748 Abnormal levels of other serum enzymes: Secondary | ICD-10-CM

## 2011-02-10 ENCOUNTER — Telehealth: Payer: Self-pay | Admitting: Family Medicine

## 2011-02-10 LAB — COMPLETE METABOLIC PANEL WITH GFR
CO2: 24 mEq/L (ref 19–32)
Creat: 0.97 mg/dL (ref 0.40–1.20)
GFR, Est African American: >60 mL/min (ref >60)
GFR, Est Non African American: 58 mL/min — ABNORMAL LOW (ref >60)
Glucose, Bld: 103 mg/dL — ABNORMAL HIGH (ref 70–99)
Total Bilirubin: 1 mg/dL (ref 0.3–1.2)

## 2011-02-10 NOTE — Telephone Encounter (Signed)
Call patient: Complete metabolic panel is okay except for her kidney function is elevated just a little bit. Encourage her to hydrate well and recheck her kidney function in one month.

## 2011-02-10 NOTE — Telephone Encounter (Signed)
Left message on vm with results  

## 2011-03-08 ENCOUNTER — Encounter: Payer: Self-pay | Admitting: Family Medicine

## 2011-03-09 ENCOUNTER — Encounter: Payer: Self-pay | Admitting: Family Medicine

## 2011-03-09 ENCOUNTER — Ambulatory Visit (INDEPENDENT_AMBULATORY_CARE_PROVIDER_SITE_OTHER): Payer: BC Managed Care – PPO | Admitting: Family Medicine

## 2011-03-09 VITALS — BP 117/79 | HR 70 | Ht 61.0 in | Wt 163.0 lb

## 2011-03-09 DIAGNOSIS — E119 Type 2 diabetes mellitus without complications: Secondary | ICD-10-CM

## 2011-03-09 LAB — POCT GLYCOSYLATED HEMOGLOBIN (HGB A1C): Hemoglobin A1C: 6.5

## 2011-03-09 NOTE — Patient Instructions (Signed)
Work on getting your eye exam this year.

## 2011-03-09 NOTE — Assessment & Plan Note (Addendum)
Doing really well with her diabetes. Keep up the good work. F/U in 4 months. Reminded her to get her eye exam. Over due  Also very overdue for her pap and reminded her to schedule that.  Due for microalbumin at next OV.

## 2011-03-09 NOTE — Progress Notes (Signed)
  Subjective:    Patient ID: Hannah Rojas, female    DOB: 05/05/1946, 65 y.o.   MRN: [id number]  Diabetes She presents for her follow-up diabetic visit. She has type 2 diabetes mellitus. Her disease course has been stable. There are no hypoglycemic associated symptoms. Pertinent negatives for diabetes include no blurred vision, no foot ulcerations, no polydipsia and no polyuria. Symptoms are stable. Current diabetic treatment includes oral agent (monotherapy). She is compliant with treatment all of the time. Her weight is stable. She is following a diabetic diet. She participates in exercise three times a week. There is no change in her home blood glucose trend. Her breakfast blood glucose range is generally 110-130 mg/dl. An ACE inhibitor/angiotensin II receptor blocker is being taken.   Has been walkin for exercise.    Review of Systems  Eyes: Negative for blurred vision.  Genitourinary: Negative for polyuria.  Hematological: Negative for polydipsia.       Objective:   Physical Exam  Constitutional: She is oriented to person, place, and time. She appears well-developed and well-nourished.  HENT:  Head: Normocephalic and atraumatic.  Eyes: Pupils are equal, round, and reactive to light.  Neck: Neck supple. No thyromegaly present.  Cardiovascular: Normal rate, regular rhythm and normal heart sounds.   Pulmonary/Chest: Effort normal.  Lymphadenopathy:    She has no cervical adenopathy.  Neurological: She is alert and oriented to person, place, and time.  Skin: Skin is warm and dry.  Psychiatric: She has a normal mood and affect.          Assessment & Plan:

## 2011-03-10 ENCOUNTER — Telehealth: Payer: Self-pay | Admitting: Family Medicine

## 2011-03-10 LAB — LIPID PANEL
Cholesterol: 204 mg/dL — ABNORMAL HIGH (ref 0–200)
VLDL: 35 mg/dL (ref 0–40)

## 2011-03-10 NOTE — Telephone Encounter (Signed)
Cholesterol is up significantly from last time. A1c looks great at 6.5. Her diabetes is well controlled. She is okay with increasing her pravastatin back to 40 mg.

## 2011-03-10 NOTE — Telephone Encounter (Signed)
LMOM for pt giving lab results/recommendations.  Told to increase pravastatin back to 40mg . Jarvis Newcomer, LPN Domingo Dimes

## 2011-04-03 ENCOUNTER — Ambulatory Visit (INDEPENDENT_AMBULATORY_CARE_PROVIDER_SITE_OTHER): Payer: BC Managed Care – PPO | Admitting: Family Medicine

## 2011-04-03 ENCOUNTER — Other Ambulatory Visit (HOSPITAL_COMMUNITY)
Admission: RE | Admit: 2011-04-03 | Discharge: 2011-04-03 | Disposition: A | Discharge status: Home / Self Care | Payer: BC Managed Care – PPO | Source: Ambulatory Visit | Attending: Family Medicine | Admitting: Family Medicine

## 2011-04-03 ENCOUNTER — Encounter: Payer: Self-pay | Admitting: Family Medicine

## 2011-04-03 DIAGNOSIS — Z124 Encounter for screening for malignant neoplasm of cervix: Secondary | ICD-10-CM

## 2011-04-03 DIAGNOSIS — E785 Hyperlipidemia, unspecified: Secondary | ICD-10-CM

## 2011-04-03 DIAGNOSIS — Z1159 Encounter for screening for other viral diseases: Secondary | ICD-10-CM | POA: Insufficient documentation

## 2011-04-03 DIAGNOSIS — Z01419 Encounter for gynecological examination (general) (routine) without abnormal findings: Secondary | ICD-10-CM | POA: Insufficient documentation

## 2011-04-03 NOTE — Progress Notes (Signed)
  Subjective:    Patient ID: Hannah Rojas, female    DOB: 05-13-1946, 65 y.o.   MRN: [id number]  HPI She is here for Pap smear only today. She denies any prior history of abnormal Pap smears.  Wants me to know off her chol med when had her last lab check.  She has restarted it and has been back on it for one week.  Review of Systems     Objective:   Physical Exam  Constitutional: She appears well-developed and well-nourished.       She is obese  Genitourinary: Uterus normal. There is no rash on the right labia. There is no rash on the left labia. Cervix exhibits friability. Cervix exhibits no motion tenderness and no discharge. Right adnexum displays no mass. Left adnexum displays no mass. There is tenderness around the vagina. No erythema or bleeding around the vagina. No vaginal discharge found.          Assessment & Plan:  Pap smear-exam performed today. We will call her with the results in about one week.  Hyperlipidemia-I want her to restart her cholesterol medication recheck her lab work in 3-4 weeks on the medication to make sure that we do not need to adjust her dose.

## 2011-04-06 ENCOUNTER — Telehealth: Payer: Self-pay | Admitting: Family Medicine

## 2011-04-06 NOTE — Telephone Encounter (Signed)
Call pt: Pap is normal. Repeat in 3 years.  

## 2011-04-11 ENCOUNTER — Other Ambulatory Visit: Payer: Self-pay | Admitting: Family Medicine

## 2011-04-11 NOTE — Telephone Encounter (Signed)
Left message on vm

## 2011-05-08 ENCOUNTER — Other Ambulatory Visit: Payer: Self-pay | Admitting: Family Medicine

## 2011-06-05 ENCOUNTER — Telehealth: Payer: Self-pay | Admitting: *Deleted

## 2011-06-05 DIAGNOSIS — E785 Hyperlipidemia, unspecified: Secondary | ICD-10-CM

## 2011-06-05 LAB — LIPID PANEL
Cholesterol: 161 mg/dL (ref 0–200)
HDL: 59 mg/dL (ref >39)
LDL Cholesterol: 76 mg/dL (ref 0–99)
Triglycerides: 129 mg/dL (ref <150)
VLDL: 26 mg/dL (ref 0–40)

## 2011-06-05 NOTE — Telephone Encounter (Signed)
Needs labs

## 2011-06-07 ENCOUNTER — Telehealth: Payer: Self-pay | Admitting: *Deleted

## 2011-06-07 NOTE — Telephone Encounter (Signed)
Message copied by Lanae Crumbly on Wed Jun 07, 2011  1:37 PM ------      Message from: Nani Gasser D      Created: Tue Jun 06, 2011  5:31 PM       Cholesterol looks much better and is at goal. Keep up the good work. Recheck in one year.

## 2011-06-07 NOTE — Telephone Encounter (Signed)
Pt notified of results. KJ LPN 

## 2011-07-10 ENCOUNTER — Ambulatory Visit (INDEPENDENT_AMBULATORY_CARE_PROVIDER_SITE_OTHER): Payer: BC Managed Care – PPO | Admitting: Family Medicine

## 2011-07-10 ENCOUNTER — Encounter: Payer: Self-pay | Admitting: Family Medicine

## 2011-07-10 DIAGNOSIS — G4700 Insomnia, unspecified: Secondary | ICD-10-CM

## 2011-07-10 DIAGNOSIS — E119 Type 2 diabetes mellitus without complications: Secondary | ICD-10-CM

## 2011-07-10 LAB — POCT UA - MICROALBUMIN

## 2011-07-10 NOTE — Progress Notes (Signed)
  Subjective:    Patient ID: Hannah Rojas, female    DOB: [birth year], 65 y.o.   MRN: [id number]  Diabetes She presents for her follow-up diabetic visit. She has type 2 diabetes mellitus. Her disease course has been stable. There are no hypoglycemic associated symptoms. Pertinent negatives for diabetes include no chest pain, no polydipsia, no polyuria, no visual change and no weight loss. There are no hypoglycemic complications. Current diabetic treatment includes oral agent (monotherapy). She is compliant with treatment all of the time. Her weight is stable. She has not had a previous visit with a dietician. She participates in exercise daily. An ACE inhibitor/angiotensin II receptor blocker is being taken.   Insomnia - taking several hours to fall asleep.  Has been going on for months.  Says kept hoping it would get better on her own.  No caffeine after lunch. Hasn't tried any sleep agis.  Most night getting 5- 6 hours total.  So ow feeling extremelly tired during the day.  Says sometimes lays in bed thinking about something.  Goes to bed around 10:00-10:30   Review of Systems  Constitutional: Negative for weight loss.  Cardiovascular: Negative for chest pain.  Genitourinary: Negative for polyuria.  Hematological: Negative for polydipsia.       Objective:   Physical Exam  Constitutional: She is oriented to person, place, and time. She appears well-developed and well-nourished.  HENT:  Head: Normocephalic and atraumatic.  Right Ear: External ear normal.  Left Ear: External ear normal.  Nose: Nose normal.  Mouth/Throat: Oropharynx is clear and moist.  Eyes: Conjunctivae are normal. Pupils are equal, round, and reactive to light.  Cardiovascular: Normal rate, regular rhythm and normal heart sounds.   Pulmonary/Chest: Effort normal and breath sounds normal.  Neurological: She is alert and oriented to person, place, and time.  Skin: Skin is warm and dry.  Psychiatric: She has a  normal mood and affect. Her behavior is normal.          Assessment & Plan:  Diabetes - A1C is 6.4 today.  At goal. Continue Amaryl. No problems. F/U in 4 months. Rec eye exam. Pt to schedule. Urine micro is neg today.  Foot exam performed today.   Insomnia - Trial of benadryl at bedtime for one week. Waned about potential grogginess. She is very avoiding caffeine. Recommend going to bed and waking up at the same time to try help train her brain went to sleep. Also continue to work on lowering her stress level. She is still having problems falling asleep we can consider a trial of trazodone. Patient will call in one to 2 weeks let us know how she's doing.

## 2011-07-10 NOTE — Patient Instructions (Addendum)
Remember to get eye exam.  Call me if the benadryl at bedtime is not helping you. Take about 1 hour before bedtime.

## 2011-07-11 ENCOUNTER — Other Ambulatory Visit: Payer: Self-pay | Admitting: Family Medicine

## 2011-07-12 DIAGNOSIS — K862 Cyst of pancreas: Secondary | ICD-10-CM | POA: Insufficient documentation

## 2011-10-10 ENCOUNTER — Other Ambulatory Visit: Payer: Self-pay | Admitting: Family Medicine

## 2011-11-09 ENCOUNTER — Ambulatory Visit: Payer: BC Managed Care – PPO | Admitting: Family Medicine

## 2011-11-20 ENCOUNTER — Encounter: Payer: Self-pay | Admitting: Family Medicine

## 2011-11-20 ENCOUNTER — Ambulatory Visit (INDEPENDENT_AMBULATORY_CARE_PROVIDER_SITE_OTHER): Payer: BC Managed Care – PPO | Admitting: Family Medicine

## 2011-11-20 DIAGNOSIS — Z1331 Encounter for screening for depression: Secondary | ICD-10-CM

## 2011-11-20 DIAGNOSIS — Z23 Encounter for immunization: Secondary | ICD-10-CM

## 2011-11-20 DIAGNOSIS — Z9181 History of falling: Secondary | ICD-10-CM

## 2011-11-20 DIAGNOSIS — E119 Type 2 diabetes mellitus without complications: Secondary | ICD-10-CM

## 2011-11-20 MED ORDER — LISINOPRIL 5 MG PO TABS: 5.0000 mg | ORAL_TABLET | Freq: Every day | ORAL | Status: DC

## 2011-11-20 MED ORDER — PRAVASTATIN SODIUM 20 MG PO TABS: 20.0000 mg | ORAL_TABLET | Freq: Every day | ORAL | Status: DC

## 2011-11-20 MED ORDER — GLIMEPIRIDE 2 MG PO TABS: 2.0000 mg | ORAL_TABLET | Freq: Every day | ORAL | Status: DC

## 2011-11-20 NOTE — Progress Notes (Signed)
  Subjective:    Patient ID: Hannah Rojas, female    DOB: 1946/09/18, 66 y.o.   MRN: [id number]  Diabetes She presents for her follow-up diabetic visit. She has type 2 diabetes mellitus. Her disease course has been stable. Pertinent negatives for diabetes include no chest pain, no foot paresthesias, no foot ulcerations, no polydipsia, no polyphagia, no polyuria and no weakness. She is compliant with treatment all of the time. Her weight is stable. She rarely participates in exercise. There is no change in her home blood glucose trend. An ACE inhibitor/angiotensin II receptor blocker is being taken. Eye exam is not current.  Has gained some weight.      Review of Systems  Cardiovascular: Negative for chest pain.  Genitourinary: Negative for polyuria.  Neurological: Negative for weakness.  Hematological: Negative for polydipsia and polyphagia.       Objective:   Physical Exam  Constitutional: She is oriented to person, place, and time. She appears well-developed and well-nourished.  HENT:  Head: Normocephalic and atraumatic.  Cardiovascular: Normal rate, regular rhythm and normal heart sounds.   Pulmonary/Chest: Effort normal and breath sounds normal.  Neurological: She is alert and oriented to person, place, and time.  Skin: Skin is warm and dry.  Psychiatric: She has a normal mood and affect. Her behavior is normal.          Assessment & Plan:  DM - Well controlled at goal. Sent 90 day supplies. Work on diet and exercise. F.u  In 3-4 months. Penumonia vaccine updated today.   Due for bone density testing  Fall rsk assessment score or 1 ( low risk).   Depression Screenin: PHQ-9 score of 1 (neg for depression).

## 2011-11-20 NOTE — Patient Instructions (Signed)
Great Job!! Keep up the goodwork.  Try to get your eye exam.

## 2012-01-22 ENCOUNTER — Encounter: Payer: Self-pay | Admitting: Family Medicine

## 2012-01-22 ENCOUNTER — Ambulatory Visit (INDEPENDENT_AMBULATORY_CARE_PROVIDER_SITE_OTHER): Payer: BC Managed Care – PPO | Admitting: Family Medicine

## 2012-01-22 VITALS — BP 125/77 | HR 104 | Ht 61.0 in | Wt 172.0 lb

## 2012-01-22 DIAGNOSIS — G4700 Insomnia, unspecified: Secondary | ICD-10-CM

## 2012-01-22 DIAGNOSIS — E119 Type 2 diabetes mellitus without complications: Secondary | ICD-10-CM

## 2012-01-22 DIAGNOSIS — R5381 Other malaise: Secondary | ICD-10-CM

## 2012-01-22 DIAGNOSIS — R5383 Other fatigue: Secondary | ICD-10-CM

## 2012-01-22 MED ORDER — GLUCOSE BLOOD VI STRP: ORAL_STRIP | Status: DC

## 2012-01-22 MED ORDER — TRAZODONE HCL 50 MG PO TABS: 50.0000 mg | ORAL_TABLET | Freq: Every evening | ORAL | Status: DC | PRN

## 2012-01-22 MED ORDER — AMBULATORY NON FORMULARY MEDICATION: Status: DC

## 2012-01-22 NOTE — Progress Notes (Signed)
  Subjective:    Patient ID: Hannah Rojas, female    DOB: 02-Oct-1945, 66 y.o.   MRN: [id number]  Diabetes She presents for her follow-up diabetic visit. She has type 2 diabetes mellitus. Her disease course has been stable. There are no hypoglycemic associated symptoms. There are no diabetic associated symptoms. Pertinent negatives for diabetes include no chest pain, no polydipsia, no polyphagia and no polyuria. Symptoms are stable. Risk factors for coronary artery disease include sedentary lifestyle. She is compliant with treatment all of the time. She is following a diabetic diet. She rarely participates in exercise. There is no change in her home blood glucose trend. Her breakfast blood glucose range is generally 110-130 mg/dl. An ACE inhibitor/angiotensin II receptor blocker is being taken. Eye exam is not current.  She plans on starting walking for exercise.   Insomnia - Tried benadryl and it made her dizzy in the morning.  Please see previous note about further details regarding her sleeping issues.  Fatigue - she has also felt very fatigued lately. She's not really sure on. She says she just feels that way all day long. No fevers.no other new or abnormal sxs.    Review of Systems  Cardiovascular: Negative for chest pain.  Genitourinary: Negative for polyuria.  Hematological: Negative for polydipsia and polyphagia.       Objective:   Physical Exam  Constitutional: She is oriented to person, place, and time. She appears well-developed and well-nourished.  HENT:  Head: Normocephalic and atraumatic.  Cardiovascular: Normal rate, regular rhythm and normal heart sounds.   Pulmonary/Chest: Effort normal and breath sounds normal.  Neurological: She is alert and oriented to person, place, and time. She has normal reflexes.  Skin: Skin is warm and dry.  Psychiatric: She has a normal mood and affect. Her behavior is normal.          Assessment & Plan:  DM- well controlled. F/U  for A1c in 1 months since early. Encouraged her to try to get her eye exam this spring or summer before her next followup in 4 months. Also due for BMP.  Insomnia  - Will try trazodone. Can start with half a tab and increase to whole tab or up to 2 tabs if needed. If she doesn't tolerate it well or has any side effects such as sedation during the morning and please give [place] a call back.  Fatigue-unclear etiology. Her sugars are very well-controlled. We'll check a CBC, TSH, cortisol etc.also check liver enzymes.  Consier may be mood related.  History of splenectomy.

## 2012-01-22 NOTE — Patient Instructions (Addendum)
Try to get your eye exam between now and next appointment.   Go for your A1C and bloodwork in one month.

## 2012-03-05 LAB — HEMOGLOBIN A1C
Hgb A1c MFr Bld: 6.8 % — ABNORMAL HIGH (ref <5.7)
Mean Plasma Glucose: 148 mg/dL — ABNORMAL HIGH (ref <117)

## 2012-03-05 LAB — BASIC METABOLIC PANEL
Calcium: 9.5 mg/dL (ref 8.4–10.5)
Potassium: 5 mEq/L (ref 3.5–5.3)
Sodium: 139 mEq/L (ref 135–145)

## 2012-03-05 LAB — FERRITIN: Ferritin: 65 ng/mL (ref 10–291)

## 2012-03-05 LAB — CBC WITH DIFFERENTIAL/PLATELET
HCT: 37.9 % (ref 36.0–46.0)
Hemoglobin: 12.5 g/dL (ref 12.0–15.0)
Lymphocytes Relative: 53 % — ABNORMAL HIGH (ref 12–46)
Lymphs Abs: 5.3 10*3/uL — ABNORMAL HIGH (ref 0.7–4.0)
MCV: 92.2 fL (ref 78.0–100.0)
Monocytes Absolute: 0.7 10*3/uL (ref 0.1–1.0)
Monocytes Relative: 7 % (ref 3–12)
Neutro Abs: 3.5 10*3/uL (ref 1.7–7.7)
WBC: 10.1 10*3/uL (ref 4.0–10.5)

## 2012-03-05 LAB — CORTISOL: Cortisol, Plasma: 13.8 ug/dL

## 2012-03-25 ENCOUNTER — Encounter: Payer: Self-pay | Admitting: Family Medicine

## 2012-03-25 ENCOUNTER — Ambulatory Visit (INDEPENDENT_AMBULATORY_CARE_PROVIDER_SITE_OTHER): Payer: BC Managed Care – PPO | Admitting: Family Medicine

## 2012-03-25 VITALS — BP 111/70 | HR 75 | Temp 98.3°F | Ht 61.0 in | Wt 173.0 lb

## 2012-03-25 DIAGNOSIS — C259 Malignant neoplasm of pancreas, unspecified: Secondary | ICD-10-CM

## 2012-03-25 DIAGNOSIS — R748 Abnormal levels of other serum enzymes: Secondary | ICD-10-CM

## 2012-03-25 DIAGNOSIS — R140 Abdominal distension (gaseous): Secondary | ICD-10-CM

## 2012-03-25 DIAGNOSIS — R143 Flatulence: Secondary | ICD-10-CM

## 2012-03-25 LAB — CBC WITH DIFFERENTIAL/PLATELET
Eosinophils Relative: 4 % (ref 0–5)
HCT: 35.7 % — ABNORMAL LOW (ref 36.0–46.0)
Lymphocytes Relative: 51 % — ABNORMAL HIGH (ref 12–46)
Lymphs Abs: 4.5 10*3/uL — ABNORMAL HIGH (ref 0.7–4.0)
MCV: 88.8 fL (ref 78.0–100.0)
Monocytes Absolute: 0.8 10*3/uL (ref 0.1–1.0)
Neutro Abs: 3.2 10*3/uL (ref 1.7–7.7)
Platelets: 291 10*3/uL (ref 150–400)
RBC: 4.02 MIL/uL (ref 3.87–5.11)
WBC: 8.9 10*3/uL (ref 4.0–10.5)

## 2012-03-25 LAB — COMPLETE METABOLIC PANEL WITH GFR
ALT: 20 U/L (ref 0–35)
AST: 21 U/L (ref 0–37)
Albumin: 4.2 g/dL (ref 3.5–5.2)
Alkaline Phosphatase: 71 U/L (ref 39–117)
GFR, Est Non African American: 59 mL/min — ABNORMAL LOW
Glucose, Bld: 96 mg/dL (ref 70–99)
Potassium: 4.5 mEq/L (ref 3.5–5.3)
Sodium: 139 mEq/L (ref 135–145)
Total Protein: 7.4 g/dL (ref 6.0–8.3)

## 2012-03-25 LAB — LIPASE: Lipase: 20 U/L (ref 0–75)

## 2012-03-25 NOTE — Progress Notes (Signed)
Subjective:    Patient ID: Hannah Rojas, female    DOB: November 11, 1945, 66 y.o.   MRN: [id number]  HPI 2 months of bloating. Not painful.  No constipation. Dec appetite. No nausea.  Says can't lose weight even though she is walking. No fevers.  No dysphagia. No prior hx of ulcers in the stomach.  Hx of pancreatic tumor dx 6 years ago.  Not tried any meds like Gas-x.  Has been under a lot of stress. No belching or reflux sxs.  No prior hx of ovarian problems. No fatigue.     Review of Systems BP 111/70  Pulse 75  Temp 98.3 F (36.8 C) (Oral)  Ht 5\' 1"  (1.549 m)  Wt 173 lb (78.472 kg)  BMI 32.69 kg/m2  SpO2 95%    Allergies  Allergen Reactions  . Naproxen Sodium   . Sulfonamide Derivatives     REACTION: rash    Past Medical History  Diagnosis Date  . Cataracts, bilateral     bilateral  . H. pylori infection     history  . Cancer     hx mucinous cystoadenoma    Past Surgical History  Procedure Date  . Cholecystectomy   . Esophagogastroduodenoscopy   . Pancreatic cyst excision   . Spleenectomy     History   Social History  . Marital Status: Widowed    Spouse Name: N/A    Number of Children: N/A  . Years of Education: N/A   Occupational History  . Not on file.   Social History Main Topics  . Smoking status: Never Smoker   . Smokeless tobacco: Not on file  . Alcohol Use: No  . Drug Use: Not on file  . Sexually Active:      works at [employer]   Other Topics Concern  . Not on file   Social History Narrative  . No narrative on file    No family history on file.  Outpatient Encounter Prescriptions as of 03/25/2012  Medication Sig Dispense Refill  . AMBULATORY NON FORMULARY MEDICATION Medication Name: Glucometer Dx 250.00, Diabetes.  1 Units  0  . fish oil-omega-3 fatty acids 1000 MG capsule Take 2 g by mouth daily.        Marland Kitchen glimepiride (AMARYL) 2 MG tablet Take 1 tablet (2 mg total) by mouth daily before breakfast.  90 tablet  1  . glucose blood test  strip Test once a day.  Dx 250.00, diabetes.  100 each  3  . lisinopril (PRINIVIL,ZESTRIL) 5 MG tablet Take 1 tablet (5 mg total) by mouth daily.  90 tablet  1  . pravastatin (PRAVACHOL) 20 MG tablet Take 1 tablet (20 mg total) by mouth daily.  30 tablet  3  . DISCONTD: traZODone (DESYREL) 50 MG tablet Take 1 tablet (50 mg total) by mouth at bedtime as needed for sleep.  30 tablet  1          Objective:   Physical Exam  Constitutional: She is oriented to person, place, and time. She appears well-developed and well-nourished.  HENT:  Head: Normocephalic and atraumatic.  Neck: Neck supple. No thyromegaly present.  Cardiovascular: Normal rate, regular rhythm and normal heart sounds.   Pulmonary/Chest: Effort normal and breath sounds normal.  Abdominal: Soft. Bowel sounds are normal. She exhibits no distension and no mass. There is tenderness. There is no rebound and no guarding.       Well healed surgical O'Donnell scar. Tender diffusely. No palpable masses.  Increased tympany.  Musculoskeletal: She exhibits no edema.  Lymphadenopathy:    She has no cervical adenopathy.  Neurological: She is alert and oriented to person, place, and time.  Skin: Skin is warm and dry.  Psychiatric: She has a normal mood and affect. Her behavior is normal.          Assessment & Plan:  Blaoting -unclear etiology. I would like to check a CBC today to make sure it's normal. She had an abnormal white count on previous exam cerebral due to recheck this anyway. I did encourage her to start Prilosec OTC the next couple weeks to see if it makes a difference. I would also like to get a CT of the abdomen since she has a history of pancreatic cysts. We'll also refer to GI for further workup and evaluation. And concerned about early satiety so I also ordered a CA 125. Recheck liver enzymes and she is also hepatitis C carrier.  Early satiety - And concerned about early satiety so I also ordered a CA 125. Refer to GI  for further workup if labs are normal.

## 2012-03-25 NOTE — Patient Instructions (Addendum)
Start Prilosec OTC once a day. We will call you with the referral We will call you with your lab results. If you don't here from [place] in about a week then please give [place] a call at [phone]. We will call with CT of abd/pelvis appointment.

## 2012-03-26 ENCOUNTER — Other Ambulatory Visit: Payer: Self-pay | Admitting: Family Medicine

## 2012-03-26 DIAGNOSIS — D709 Neutropenia, unspecified: Secondary | ICD-10-CM

## 2012-03-28 ENCOUNTER — Telehealth: Payer: Self-pay | Admitting: *Deleted

## 2012-03-28 DIAGNOSIS — R749 Abnormal serum enzyme level, unspecified: Secondary | ICD-10-CM

## 2012-03-28 DIAGNOSIS — R140 Abdominal distension (gaseous): Secondary | ICD-10-CM

## 2012-03-28 DIAGNOSIS — C259 Malignant neoplasm of pancreas, unspecified: Secondary | ICD-10-CM

## 2012-03-29 ENCOUNTER — Telehealth: Payer: Self-pay | Admitting: Hematology & Oncology

## 2012-03-29 NOTE — Telephone Encounter (Signed)
Left pt message to call for appointment °

## 2012-04-01 ENCOUNTER — Ambulatory Visit (HOSPITAL_BASED_OUTPATIENT_CLINIC_OR_DEPARTMENT_OTHER)
Admission: RE | Admit: 2012-04-01 | Discharge: 2012-04-01 | Disposition: A | Discharge status: Home / Self Care | Payer: BC Managed Care – PPO | Source: Ambulatory Visit | Attending: Family Medicine | Admitting: Family Medicine

## 2012-04-01 ENCOUNTER — Telehealth: Payer: Self-pay | Admitting: Hematology & Oncology

## 2012-04-01 ENCOUNTER — Telehealth: Payer: Self-pay | Admitting: Family Medicine

## 2012-04-01 ENCOUNTER — Other Ambulatory Visit: Payer: Self-pay | Admitting: Family Medicine

## 2012-04-01 ENCOUNTER — Other Ambulatory Visit (HOSPITAL_BASED_OUTPATIENT_CLINIC_OR_DEPARTMENT_OTHER): Payer: Medicare Other

## 2012-04-01 DIAGNOSIS — R141 Gas pain: Secondary | ICD-10-CM | POA: Insufficient documentation

## 2012-04-01 DIAGNOSIS — R749 Abnormal serum enzyme level, unspecified: Secondary | ICD-10-CM

## 2012-04-01 DIAGNOSIS — R142 Eructation: Secondary | ICD-10-CM | POA: Insufficient documentation

## 2012-04-01 DIAGNOSIS — K55069 Acute infarction of intestine, part and extent unspecified: Secondary | ICD-10-CM

## 2012-04-01 DIAGNOSIS — C259 Malignant neoplasm of pancreas, unspecified: Secondary | ICD-10-CM | POA: Insufficient documentation

## 2012-04-01 DIAGNOSIS — R748 Abnormal levels of other serum enzymes: Secondary | ICD-10-CM | POA: Insufficient documentation

## 2012-04-01 DIAGNOSIS — R140 Abdominal distension (gaseous): Secondary | ICD-10-CM

## 2012-04-01 DIAGNOSIS — R935 Abnormal findings on diagnostic imaging of other abdominal regions, including retroperitoneum: Secondary | ICD-10-CM | POA: Insufficient documentation

## 2012-04-01 NOTE — Telephone Encounter (Signed)
Pt aware of 8-12 appointment °

## 2012-04-01 NOTE — Telephone Encounter (Signed)
Left pt message to call for appointment °

## 2012-04-01 NOTE — Telephone Encounter (Signed)
Error

## 2012-04-09 ENCOUNTER — Encounter: Payer: Self-pay | Admitting: *Deleted

## 2012-04-10 ENCOUNTER — Encounter: Payer: Self-pay | Admitting: *Deleted

## 2012-04-29 ENCOUNTER — Ambulatory Visit (HOSPITAL_BASED_OUTPATIENT_CLINIC_OR_DEPARTMENT_OTHER): Payer: BC Managed Care – PPO | Admitting: Medical

## 2012-04-29 ENCOUNTER — Other Ambulatory Visit (HOSPITAL_BASED_OUTPATIENT_CLINIC_OR_DEPARTMENT_OTHER): Payer: BC Managed Care – PPO | Admitting: Lab

## 2012-04-29 ENCOUNTER — Ambulatory Visit: Payer: Medicare Other | Admitting: Hematology & Oncology

## 2012-04-29 ENCOUNTER — Inpatient Hospital Stay (HOSPITAL_COMMUNITY): Admit: 2012-04-29 | Payer: Self-pay

## 2012-04-29 ENCOUNTER — Other Ambulatory Visit (HOSPITAL_COMMUNITY)
Admission: RE | Admit: 2012-04-29 | Discharge: 2012-04-29 | Disposition: A | Discharge status: Home / Self Care | Payer: BC Managed Care – PPO | Source: Ambulatory Visit | Attending: Hematology & Oncology | Admitting: Hematology & Oncology

## 2012-04-29 ENCOUNTER — Ambulatory Visit: Payer: BC Managed Care – PPO

## 2012-04-29 VITALS — BP 115/75 | HR 89 | Temp 97.2°F | Resp 18 | Ht 59.0 in | Wt 170.0 lb

## 2012-04-29 DIAGNOSIS — D709 Neutropenia, unspecified: Secondary | ICD-10-CM

## 2012-04-29 DIAGNOSIS — Z9089 Acquired absence of other organs: Secondary | ICD-10-CM

## 2012-04-29 DIAGNOSIS — D72820 Lymphocytosis (symptomatic): Secondary | ICD-10-CM

## 2012-04-29 LAB — CBC WITH DIFFERENTIAL (CANCER CENTER ONLY)
BASO%: 0.4 % (ref 0.0–2.0)
LYMPH%: 46.9 % (ref 14.0–48.0)
MCV: 95 fL (ref 81–101)
MONO#: 1.1 10*3/uL — ABNORMAL HIGH (ref 0.1–0.9)
Platelets: 286 10*3/uL (ref 145–400)
RDW: 13 % (ref 11.1–15.7)
WBC: 14 10*3/uL — ABNORMAL HIGH (ref 3.9–10.0)

## 2012-04-29 LAB — CHCC SATELLITE - SMEAR

## 2012-04-29 NOTE — Progress Notes (Signed)
This office note has been dictated.

## 2012-04-30 NOTE — Progress Notes (Signed)
CC:   Nani Gasser, M.D.  DIAGNOSIS:  Lymphocytosis.  HISTORY OF PRESENT ILLNESS:  Ms Cisse is a very nice 66 year old white female.  She is originally from [facility].  She has been in the [place] area for 10 years.  She currently works at [employer].  She sees Dr. Nani Gasser.  Dr. Linford Arnold, being very thorough, has been doing some blood work on her.  Ms. Lafrance has had some abdominal issues.  She had a normal CA-125.  Amylase and lipase normal.  She was found to have some elevation of her white blood cells.  Back in early July, a CBC was done which showed a white cell count 8.9, hemoglobin 12.4, hematocrit 35.7, platelet count 291.  Her white cell differential, however, showed 35% segs, 51% lymphs, 9% monos.  Because of the abnormal ratio of white cells, Dr. Linford Arnold felt that a hematologic evaluation was indicated.  As such, Ms. Marzec was kindly referred to the [facility].  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  She also had her spleen taken out with this.  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  From Dr. Shelah Lewandowsky notes, she reports that Ms. Luis is a hepatitis C carrier.  She did have I think a CT scan done.  This was because of "abdominal bloating."  The CT scan did not show anything unusual with respect to malignancy.  There was a 1.7 x 5.1 cm fatty lesion in the anterior abdominal mesentery, which was "suspicious" for a small omental infarct.  Ms. Kalt is going to be undergo an EGD and colonoscopy in about 2 weeks.  She has had no problems with bleeding.  There has been no change in bowel or bladder habits.  She has had no cough.  She has had no swollen lymph glands.  She has not noted any rashes.  There has been no easy bruising.  Again, Ms. Dupee was kindly referred to the [facility] for an evaluation.  PAST MEDICAL HISTORY: 1. Remarkable for \\partial  pancreatectomy  with splenectomy. 2. Cholecystectomy. 3. hepatitis C carrier. 4. Hypertension. 5. Hyperlipidemia. 6. Noninsulin-dependent diabetes.  ALLERGIES: 1. Sulfa. 2. Nonsteroidals.  MEDICATIONS:  Amaryl 2 mg p.o. daily, lisinopril 5 mg p.o. daily, Pravachol 20 mg p.o. daily.  SOCIAL HISTORY:  Negative for tobacco use.  There is no alcohol use. There are no obvious occupational exposures.  FAMILY HISTORY:  Noncontributory.  REVIEW OF SYSTEMS:  As stated in history present illness.  No additional findings noted on 12-system review.  PHYSICAL EXAMINATION:  This is a well-developed, well-nourished white female in no obvious distress.  Vital signs:  A temperature of 97.2, pulse 89, respiratory rate 18, blood pressure 115/75.  Weight is 170. Head and neck:  Normocephalic, atraumatic skull.  There are no ocular or oral lesions.  There are no palpable cervical or supraclavicular lymph nodes.  Lungs:  Clear to percussion and auscultation bilaterally. Cardiac:  Regular rate and rhythm with a normal S1 and S2.  There are no murmurs, rubs or bruits.  Abdomen:  Laparotomy scars from her partial pancreatectomy and cholecystectomy.  There is no fluid wave.  There is no guarding or rebound tenderness.  There is no palpable abdominal mass. There is no palpable hepatomegaly.  Axillary exam:  No bilateral axillary adenopathy.  Back:  No tenderness over the spine, ribs or hips. Extremities:  No clubbing, cyanosis or edema.  Skin:  No rashes, ecchymoses or petechia.  Neurological:  No focal neurological deficits.  LABORATORY STUDIES:  White cell count is 14, 13.2, hematocrit 39.8, platelet count is 286.  White cell differential shows 42 segs, 47 lymphs, 8 monos, 2 eosinophils.  Peripheral smear shows a normochromic, normocytic population of red blood cells.  There are no nucleated red blood cells.  There are no teardrop cells.  I see no rouleaux formation.  There are no target cells.  I see no  schistocytes or spherocytes.  White cells are mildly increased in number.  She has good maturation of her white blood cells. I see no hypersegmented polys.  She may have a few large lymphocytes.  I did not see any smudge cells.  There are no immature myeloid cells.  I saw no blasts.  Platelets were adequate in number and size.  IMPRESSION:  Ms. Strand is a very charming 66 year old white female with transient neutropenia.  Her white cell count is mildly elevated today.  This certainly is not unexpected given that she does have a splenectomy.  It is not uncommon for white cells to fluctuate in patients with splenectomies.  I looked at her blood smear.  She may have had a couple of atypical lymphocytes.  I will go ahead and send her blood off for flow cytometry. This would be the definitive test as to whether or not she has a lymphoproliferative process or chronic lymphoid leukemia.  On her physical exam I could not find anything that was unusual for an underlying hematologic issue.  Again, her blood smear is somewhat reassuring to me.  The flow cytometry will be the definitive test.  I do not see that we have to do a bone marrow test on her.  I think that if we do find CLL that we can just follow her along.  I gave Ms. Eanes a prayer blanket.  She was very appreciative of this.  Hopefully, she will be able to take this when she has her endoscopies.  I do not think we need to see Ms. Juarez back in the office unless we do find that she does have CLL.  If so, then we can get her back in a couple of months and plan for routine followup.  I spent about an hour with Ms. Bonnes today.  It was very nice talking to her.  Again, she is awfully nice.    ______________________________ Josph Macho, M.D. PRE/MEDQ  D:  04/29/2012  T:  04/30/2012  Job:  [id number]

## 2012-04-30 NOTE — Progress Notes (Signed)
This office note has been dictated.

## 2012-05-06 ENCOUNTER — Telehealth: Payer: Self-pay | Admitting: *Deleted

## 2012-05-06 NOTE — Telephone Encounter (Addendum)
Message copied by Mirian Capuchin on Mon May 06, 2012  9:54 AM ------      Message from: Josph Macho      Created: Sun May 05, 2012  3:41 PM       Call - NO evidence of leukemia or other bone marrow issue!!!  Have a great year!!!   Cindee Lame This message given to pt who voiced understanding.

## 2012-05-08 LAB — HM COLONOSCOPY

## 2012-05-27 ENCOUNTER — Encounter: Payer: Self-pay | Admitting: Family Medicine

## 2012-05-27 ENCOUNTER — Ambulatory Visit (INDEPENDENT_AMBULATORY_CARE_PROVIDER_SITE_OTHER): Payer: BC Managed Care – PPO | Admitting: Family Medicine

## 2012-05-27 VITALS — BP 115/76 | HR 83 | Wt 172.0 lb

## 2012-05-27 DIAGNOSIS — E785 Hyperlipidemia, unspecified: Secondary | ICD-10-CM

## 2012-05-27 DIAGNOSIS — G4700 Insomnia, unspecified: Secondary | ICD-10-CM

## 2012-05-27 DIAGNOSIS — E119 Type 2 diabetes mellitus without complications: Secondary | ICD-10-CM

## 2012-05-27 LAB — POCT GLYCOSYLATED HEMOGLOBIN (HGB A1C): Hemoglobin A1C: 6.7

## 2012-05-27 MED ORDER — ZOLPIDEM TARTRATE 5 MG PO TABS: 5.0000 mg | ORAL_TABLET | Freq: Every evening | ORAL | Status: DC | PRN

## 2012-05-27 NOTE — Patient Instructions (Addendum)
Remember to get your eye exam.

## 2012-05-27 NOTE — Progress Notes (Signed)
  Subjective:    Patient ID: Hannah Rojas, female    DOB: 1946-03-27, 66 y.o.   MRN: [id number]  HPI DM- Doing well overall.  Sugars are running under 130.  Getting some regular exercise. Plans on starting curves. Only taking Amaryl.  Insomnia - Says having a hard time falling aleep. Says often will toss and turn until 1 AM.  No caffeine.  No TV to fall alseep.  Not sleeping well for a year.  Used to be on trazodone but was causing nightmares.  Says not mind is not racing.  Not sure why she's not sleeping well. Once she is finally alseep usually does ok.   Review of Systems     Objective:   Physical Exam  Constitutional: She is oriented to person, place, and time. She appears well-developed and well-nourished.  HENT:  Head: Normocephalic and atraumatic.  Cardiovascular: Normal rate, regular rhythm and normal heart sounds.   Pulmonary/Chest: Effort normal and breath sounds normal.  Neurological: She is alert and oriented to person, place, and time.  Skin: Skin is warm and dry.  Psychiatric: She has a normal mood and affect. Her behavior is normal.          Assessment & Plan:  DM- Well controlled. Due for DMP and lipids. I started an exercise program would be fantastic. We also offer some precautions here in our building and I mentioned that her today. Also remind her to schedule her diabetic eye exam. Lab Results  Component Value Date   HGBA1C 6.7 05/27/2012   Insomnia - discussed options. She does stay away from caffeine she's not using TB to actually go to bed and falsely. She's not exactly sure why she can't sleep the last year. She denies feeling extremely anxious or worried around bedtime. We can try a low dose of Ambien but I explained her that if she starts to have any nightmares or sleep walking etc. she does stop immediately in throat in the trash. We'll start Ambien 5 mg.

## 2012-05-30 LAB — LIPID PANEL
LDL Cholesterol: 66 mg/dL (ref 0–99)
VLDL: 40 mg/dL (ref 0–40)

## 2012-06-03 ENCOUNTER — Encounter: Payer: Self-pay | Admitting: Family Medicine

## 2012-06-10 ENCOUNTER — Ambulatory Visit (INDEPENDENT_AMBULATORY_CARE_PROVIDER_SITE_OTHER): Payer: BC Managed Care – PPO | Admitting: Family Medicine

## 2012-06-10 DIAGNOSIS — Z23 Encounter for immunization: Secondary | ICD-10-CM

## 2012-07-05 ENCOUNTER — Other Ambulatory Visit: Payer: Self-pay | Admitting: *Deleted

## 2012-07-05 ENCOUNTER — Other Ambulatory Visit: Payer: Self-pay | Admitting: Family Medicine

## 2012-07-05 MED ORDER — GLIMEPIRIDE 2 MG PO TABS: 2.0000 mg | ORAL_TABLET | Freq: Every day | ORAL | Status: DC

## 2012-07-06 ENCOUNTER — Other Ambulatory Visit: Payer: Self-pay | Admitting: Family Medicine

## 2012-07-08 ENCOUNTER — Other Ambulatory Visit: Payer: Self-pay | Admitting: *Deleted

## 2012-07-08 MED ORDER — GLIMEPIRIDE 2 MG PO TABS: 2.0000 mg | ORAL_TABLET | Freq: Every day | ORAL | Status: DC

## 2012-08-07 ENCOUNTER — Ambulatory Visit (INDEPENDENT_AMBULATORY_CARE_PROVIDER_SITE_OTHER): Payer: BC Managed Care – PPO | Admitting: Family Medicine

## 2012-08-07 ENCOUNTER — Encounter: Payer: Self-pay | Admitting: Family Medicine

## 2012-08-07 VITALS — BP 126/76 | HR 77 | Temp 97.9°F | Wt 172.0 lb

## 2012-08-07 DIAGNOSIS — K1370 Unspecified lesions of oral mucosa: Secondary | ICD-10-CM | POA: Insufficient documentation

## 2012-08-07 DIAGNOSIS — I951 Orthostatic hypotension: Secondary | ICD-10-CM

## 2012-08-07 DIAGNOSIS — K1321 Leukoplakia of oral mucosa, including tongue: Secondary | ICD-10-CM

## 2012-08-07 MED ORDER — LISINOPRIL 2.5 MG PO TABS: 2.5000 mg | ORAL_TABLET | Freq: Every day | ORAL | Status: DC

## 2012-08-07 NOTE — Progress Notes (Signed)
CC: ELLER SWEIS is a 66 y.o. female is here for Dizziness   Subjective: HPI:  Patient reports 3 days of dizziness. This occurs only when changing positions from a lying to seated or seated to standing position. It lasts 1-2 minutes and resolves with standing still. When she changes positions when lying is not apparent. When she is driving her objects or moving around her the dizziness does not occur. She further describes the dizziness as a unsteadiness. She's never had this before. She has not changed her fluid intake, diet, nor medication regimen. She denies fevers, chills, facial pain, nasal discharge, ear pain, ear fullness, hearing loss, ringing in the ears, roaring of the ears, cough, shortness of breath, irregular heartbeat, rapid heartbeat. No interventions as of yet she denies recent weight loss. Fasting sugar this morning was 114 she denies hypoglycemic episodes    Review Of Systems Outlined In HPI  Past Medical History  Diagnosis Date  . Cataracts, bilateral     bilateral  . H. pylori infection     history  . Cancer     hx mucinous cystoadenoma     No family history on file.   History  Substance Use Topics  . Smoking status: Never Smoker   . Smokeless tobacco: Not on file  . Alcohol Use: No     Objective: Filed Vitals:   08/07/12 1413  BP: 126/76  Pulse: 77  Temp:     General: Alert and Oriented, No Acute Distress HEENT: Pupils equal, round, reactive to light. Conjunctivae clear.  External ears unremarkable, canals clear with intact TMs with appropriate landmarks.  Middle ear appears open without effusion. Pink inferior turbinates.  Moist mucous membranes, pharynx without inflammation nor lesions. Right lateral tongue has atrophic papillae and irregular shaped well-defined white plaques.   Neck supple without palpable lymphadenopathy nor abnormal masses. Lungs: Clear to auscultation bilaterally, no wheezing/ronchi/rales.  Comfortable work of breathing. Good  air movement. Cardiac: Regular rate and rhythm. Normal S1/S2.  No murmurs, rubs, nor gallops.  No carotid bruits Extremities: No peripheral edema.  Strong peripheral pulses.    Assessment & Plan: Tanaysha was seen today for dizziness.  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    Leukoplakia: She's not a tobacco user but she may be getting irritation from poor dentition in the right inferior anterior molar. She reports discomfort on the side of the tongue with spicy foods. I sent her to ear nose and throat for confirmation of the diagnosis and evaluation for further management. I've asked her to get in touch with her dentist to help with dental repair. She was given handout for further education of my suspicion of leukoplakia. Dizziness I believe this caused orthostatic hypertension as seen in her blood pressure drop. Cutting back lisinopril 2.5 mg as she is a diabetic as like to preserve renal protective effects of this medication. I've asked return to see Dr. Judie Petit or myself in one week  Return in about 1 week (around 08/14/2012).

## 2012-08-07 NOTE — Patient Instructions (Addendum)
Leukoplakia  Leukoplakia occurs when white patches develop in your mouth. They may show up on the insides of your cheeks, on or under your tongue, or on your gums. Leukoplakia can also develop on the outside of the female genitalia (vulva ). In rare cases, it can occur in the area around the anus.  In some cases, leukoplakia predicts an increased risk of cancer.   CAUSES   Some of the many conditions that can cause or increase the risk of leukoplakia in the mouth include:   Irritation from rough teeth or dentures.   Any type of tobacco use, especially with alcohol.   Weakened immune system (such as occurs with HIV/AIDS or after a bone marrow transplant).  Many conditions can also cause or increase the risk of leukoplakia of the vulva including:   Long lasting infections of the vulva.   Some skin diseases.   Long lasting irritation of the vulva.   Long-term use of steroid creams or ointments.   Weakened immune system (such as occurs with HIV/AIDS or after a bone marrow transplant).  SYMPTOMS   The main symptom is the development of patches or flat areas in the mouth or on the vulva. These patches may be:   Odd-shaped.   Hard.   Raised.   White or grey in color (although may be reddened).   Unable to be wiped or scraped away.   Fuzzy.   Sensitive to spicy or acidic foods, touch or heat.  DIAGNOSIS    Due to its distinct appearance, diagnosis of leukoplakia is often straightforward.   Your caregiver may choose to take a sample (biopsy) of a patch. Your caregiver may choose to take a sample (biopsy) of a patch, to verify that it is leukoplakia, and to make sure that it is not a form of cancer.  TREATMENT   Leukoplakia is treated by:   Stopping tobacco use.   Repairing any rough teeth or dentures.   Surgical removal of the patch, using surgical knife (scalpel), laser, heat or cold.   Medicines that can be applied to the patches.  HOME CARE INSTRUCTIONS    Do not use any form of tobacco.    Check with the dentist to see if you need any repairs.   Only take over-the-counter or prescription medicines for pain, discomfort or fever as directed by your caregiver.   Avoid foods or beverages that seem to irritate the leukoplakia.  SEEK MEDICAL CARE IF:   You notice more patches of leukoplakia.   You notice changes in size, shape or feeling of existing patches.   You develop fever (more than 100.5 F (38.1 C).  SEEK IMMEDIATE MEDICAL CARE IF:   The patch becomes extremely painful, and the pain is not helped with prescribed medicine.   The patch begins to bleed, and you cannot stop the bleeding.   You have a patch in the mouth that becomes so swollen that it you have trouble eating and/or breathing.   You have a patch at the vulva that becomes so swollen that you have trouble passing urine.   An unexplained oral temperature above 102.0 F (38.9 C) develops.  MAKE SURE YOU:    Understand these instructions.   Will watch your condition.   Will get help right away if you are not doing well or get worse.  Document Released: 08/17/2008 Document Revised: 11/27/2011 Document Reviewed: 08/17/2008  [employer] Patient Information 2013 [employer].

## 2012-08-14 ENCOUNTER — Ambulatory Visit: Payer: Medicare Other | Admitting: Family Medicine

## 2012-08-14 DIAGNOSIS — Z0289 Encounter for other administrative examinations: Secondary | ICD-10-CM

## 2012-08-19 ENCOUNTER — Encounter: Payer: Self-pay | Admitting: Family Medicine

## 2012-08-19 ENCOUNTER — Ambulatory Visit (INDEPENDENT_AMBULATORY_CARE_PROVIDER_SITE_OTHER): Payer: Medicare Other | Admitting: Family Medicine

## 2012-08-19 VITALS — BP 128/79 | HR 103 | Ht 61.0 in | Wt 171.0 lb

## 2012-08-19 DIAGNOSIS — I951 Orthostatic hypotension: Secondary | ICD-10-CM

## 2012-08-19 MED ORDER — PRAVASTATIN SODIUM 20 MG PO TABS: 20.0000 mg | ORAL_TABLET | Freq: Every day | ORAL | Status: DC

## 2012-08-19 NOTE — Progress Notes (Signed)
  Subjective:    Patient ID: Hannah Rojas, female    DOB: 1946/06/30, 66 y.o.   MRN: [id number]  HPI F/U orthostatic hypotension.  Was seen about 2 weeks ago. Says does feel better overall but says does occ feel light headed when stands up to quickly. Feeling much better on the 2.5 of the medication.    Review of Systems     Objective:   Physical Exam  Constitutional: She is oriented to person, place, and time. She appears well-developed and well-nourished.  HENT:  Head: Normocephalic and atraumatic.  Cardiovascular: Normal rate, regular rhythm and normal heart sounds.   Pulmonary/Chest: Effort normal and breath sounds normal.  Neurological: She is alert and oriented to person, place, and time.  Skin: Skin is warm and dry.  Psychiatric: She has a normal mood and affect. Her behavior is normal.          Assessment & Plan:  Orthostatic hypotension - Says she works on drinking fluids. She is feeling better on lower dose. Make sure transition positions slowly. Keep f/u in Jan for Diabetes. Consider getting home BP machine.

## 2012-08-21 ENCOUNTER — Encounter: Payer: Self-pay | Admitting: Family Medicine

## 2012-09-26 ENCOUNTER — Encounter: Payer: Self-pay | Admitting: Family Medicine

## 2012-09-26 ENCOUNTER — Ambulatory Visit (INDEPENDENT_AMBULATORY_CARE_PROVIDER_SITE_OTHER): Payer: BC Managed Care – PPO | Admitting: Family Medicine

## 2012-09-26 VITALS — BP 123/73 | HR 86 | Resp 18 | Wt 169.0 lb

## 2012-09-26 DIAGNOSIS — E119 Type 2 diabetes mellitus without complications: Secondary | ICD-10-CM

## 2012-09-26 DIAGNOSIS — R55 Syncope and collapse: Secondary | ICD-10-CM

## 2012-09-26 DIAGNOSIS — Z780 Asymptomatic menopausal state: Secondary | ICD-10-CM

## 2012-09-26 DIAGNOSIS — N951 Menopausal and female climacteric states: Secondary | ICD-10-CM

## 2012-09-26 LAB — POCT UA - MICROALBUMIN: Microalbumin Ur, POC: 30 mg/dL

## 2012-09-26 LAB — POCT GLYCOSYLATED HEMOGLOBIN (HGB A1C): Hemoglobin A1C: 6.5

## 2012-09-26 NOTE — Progress Notes (Addendum)
  Subjective:    Patient ID: Hannah Rojas, female    DOB: May 03, 1946, 67 y.o.   MRN: [id number]  HPI On 09/25/11 was at work, her right leg went completely numb and then she fell to the floor after passing out.  Jhonnie Garner to [facility] ED and was told she was dehydrated.She says they scanned her head and was told it was normal.   She ws given an antibiotic but hasn't started it.  No fever.  No abdominal pain recently.  Hx of pancreatic cyst.  No urainry sxs.  She is on cardiac monitors the whole time and it did not show anything significant. Head CT on review of her record showed no intra- cranial abnormality.  Evidently her white count was slightly elevated at 11.1 and they thought that she may have had a viral syndrome that may have caused some mild dehydration and postural pass out. They did rehydrate her overnight.  No other source of infection.   DM- Sugars well controlled overall.  Had hypo event yesterday.  No cuts or sores that aren't healing well. On ACE. Tolerating amyrl well.     Review of Systems     Objective:   Physical Exam  Constitutional: She is oriented to person, place, and time. She appears well-developed and well-nourished.  HENT:  Head: Normocephalic and atraumatic.  Right Ear: External ear normal.  Left Ear: External ear normal.  Nose: Nose normal.  Mouth/Throat: Oropharynx is clear and moist.       TMs and canals are clear.   Eyes: Conjunctivae normal and EOM are normal. Pupils are equal, round, and reactive to light.  Neck: Neck supple. No thyromegaly present.  Cardiovascular: Normal rate, regular rhythm and normal heart sounds.   Pulmonary/Chest: Effort normal and breath sounds normal. She has no wheezes.  Abdominal: Soft. Bowel sounds are normal. She exhibits no distension and no mass. There is no tenderness. There is no rebound and no guarding.  Lymphadenopathy:    She has no cervical adenopathy.  Neurological: She is alert and oriented to person, place, and time.  She has normal reflexes. No cranial nerve deficit. Coordination normal.       Alert and oriented.  CN 2-12 intact.   Reflexes symmetric in the UE and LE.  Normal knee to ankle, down the shin bilaterally.  No tremor.  Strength is 5/5 bilaterally in UE and LE.    Skin: Skin is warm and dry.  Psychiatric: She has a normal mood and affect. Her behavior is normal.          Assessment & Plan:  Syncope - unclear etiology. They rehydrated her. They felt it was most likely a viral illness. She did have a negative head CT to rule out tumor etc. Nothing was seen on cardiac monitor during hospitalization. At this point she's feeling much better. She's not had any more syncopal or near syncopal events since being home. She also reports that her blood sugars have been well controlled. Please call the office if she starts noticing recurrence of symptoms or dizziness or lightheadedness. She denies any recent abdominal discomfort.  DM - well-controlled. Hemoglobin A1c is 6.5 today which looks fantastic. Continue current regimen. Call if any new hypoglycemic events. Had a hypoglycemic event yesterday while in the hospital.  On ACE, statin.  Continue current regimen.    CKD-3 - stable.   Postmenopausal-she's also due for bone density test. I believe she's never had one. We will put in a referral.

## 2012-09-27 LAB — COMPLETE METABOLIC PANEL WITH GFR
ALT: 27 U/L (ref 0–35)
AST: 27 U/L (ref 0–37)
Albumin: 4.6 g/dL (ref 3.5–5.2)
BUN: 19 mg/dL (ref 6–23)
Calcium: 10.5 mg/dL (ref 8.4–10.5)
Chloride: 104 mEq/L (ref 96–112)
Potassium: 4.7 mEq/L (ref 3.5–5.3)

## 2012-09-27 LAB — CBC WITH DIFFERENTIAL/PLATELET
Basophils Absolute: 0.1 10*3/uL (ref 0.0–0.1)
HCT: 39 % (ref 36.0–46.0)
Lymphocytes Relative: 45 % (ref 12–46)
Monocytes Absolute: 0.7 10*3/uL (ref 0.1–1.0)
Neutro Abs: 5.8 10*3/uL (ref 1.7–7.7)
RBC: 4.28 MIL/uL (ref 3.87–5.11)
RDW: 13.6 % (ref 11.5–15.5)
WBC: 12.3 10*3/uL — ABNORMAL HIGH (ref 4.0–10.5)

## 2012-09-29 ENCOUNTER — Encounter: Payer: Self-pay | Admitting: Family Medicine

## 2012-09-30 ENCOUNTER — Telehealth: Payer: Self-pay | Admitting: *Deleted

## 2012-09-30 NOTE — ED Notes (Signed)
Patient informed of lab results. She had no further questions.

## 2012-09-30 NOTE — Telephone Encounter (Signed)
Message copied by Edilia Bo on Mon Sep 30, 2012  2:10 PM ------      Message from: Nani Gasser D      Created: Sun Sep 29, 2012  9:19 PM       Call pt: white cell count is down a little from 5 months ago.  O/W labs are stable.

## 2012-10-03 ENCOUNTER — Ambulatory Visit (INDEPENDENT_AMBULATORY_CARE_PROVIDER_SITE_OTHER): Payer: Medicare Other

## 2012-10-03 DIAGNOSIS — M899 Disorder of bone, unspecified: Secondary | ICD-10-CM

## 2012-10-03 DIAGNOSIS — Z780 Asymptomatic menopausal state: Secondary | ICD-10-CM

## 2012-10-03 DIAGNOSIS — N951 Menopausal and female climacteric states: Secondary | ICD-10-CM

## 2012-10-09 ENCOUNTER — Other Ambulatory Visit: Payer: Self-pay | Admitting: Family Medicine

## 2012-10-23 ENCOUNTER — Encounter: Payer: Self-pay | Admitting: Family Medicine

## 2012-10-28 ENCOUNTER — Ambulatory Visit (INDEPENDENT_AMBULATORY_CARE_PROVIDER_SITE_OTHER): Payer: Medicare Other | Admitting: Physician Assistant

## 2012-10-28 ENCOUNTER — Ambulatory Visit (INDEPENDENT_AMBULATORY_CARE_PROVIDER_SITE_OTHER): Payer: Medicare Other

## 2012-10-28 ENCOUNTER — Encounter: Payer: Self-pay | Admitting: Physician Assistant

## 2012-10-28 VITALS — BP 133/80 | HR 89 | Wt 174.0 lb

## 2012-10-28 DIAGNOSIS — S62639A Displaced fracture of distal phalanx of unspecified finger, initial encounter for closed fracture: Secondary | ICD-10-CM

## 2012-10-28 DIAGNOSIS — S6992XA Unspecified injury of left wrist, hand and finger(s), initial encounter: Secondary | ICD-10-CM

## 2012-10-28 DIAGNOSIS — M8580 Other specified disorders of bone density and structure, unspecified site: Secondary | ICD-10-CM

## 2012-10-28 DIAGNOSIS — W230XXA Caught, crushed, jammed, or pinched between moving objects, initial encounter: Secondary | ICD-10-CM

## 2012-10-28 NOTE — Progress Notes (Signed)
  Subjective:    Patient ID: Hannah Rojas, female    DOB: 1946/02/21, 67 y.o.   MRN: [id number]  HPI Patient is a 67 yo female who present to the the clinic with a left ring finger injury to her left hand. 3 days ago she got a leash from dog wrapped around her ring finger. She has tried epson salt and 200mg  ibuprofen a couple of times. There was significant swelling and pain. There is very little ROM in finger. Pain is a 5/10. She denies any numbness or tingling or any open laceration. Pt is osteopenic but not taking calcium and vitamin D like they are supposed to. No history of fractures.     Review of Systems     Objective:   Physical Exam  Constitutional: She appears well-developed and well-nourished.  Musculoskeletal:  Minimal ROM of left ring finger. Bruised and swollen. Tender to palpation over PCP and DCP. Strength 2/5 in ring finger.           Assessment & Plan:  Left ring finger injury/osteopenia- X-ray per my person opinion reviewed and bones appear very brittle but no evidence of fracture. Per radiologist distal fracture at base of phalax. Treatment would be the same. I buddy tapped ring to pinky finger. Encouraged ice, epson salt, and increasing ibuprofen. Gave handout on finger strain. Start ROM exercises as soon as can tolerate. Pt reminded to start Calcium 600mg  daily with 800IU of Vitamin D.

## 2012-10-28 NOTE — Patient Instructions (Addendum)
Would recommend Calcium 600mg  a day and 800IU of D3 for bone health.   Continue epson salt. Keep immoblized for 7 days. Ice daily. Consider ibuprofen up to 800mg  three times a day.   Finger Sprain A sprain is an injury where a ligament is over-stretched or torn. A ligament holds joints together. Finger sprains are a common injury among athletes. Sprains are classified into 3 categories. Grade 1 sprains cause pain, but the ligament is not lengthened. Grade 2 sprains include a lengthened ligament, due to stretching or partial tearing. With grade 2 sprains, there is still function, although function may be decreased. Grade 3 sprains are marked by a complete tear of the ligament. The joint usually suffers a loss of function. Severe sprains sometimes require surgery.  SYMPTOMS   Severe pain, at the time of injury.  Often, a feeling of popping or tearing inside one or more fingers.  Tenderness, swelling, and later bruising in the finger.  Impaired ability to use the injured finger. CAUSES  Stress, often from an unnatural degree of movement, exceeds the strength of the ligament, and the ligament is either stretched or torn. RISK INCREASES WITH:  Previous finger sprain or injury.  Contact sports and sports involving catching and throwing (i.e. baseball, basketball, football).  Poor hand strength and flexibility.  Inadequate or poorly fitting protective equipment. PREVENTION Taping, protective strapping, bracing, or splints may help prevent injury. PROGNOSIS  For first time injuries, sufficient healing time before resuming activity should prevent recurring injury or permanent impairment. Due to poor blood supply, ligaments do not heal well, and require a longer healing time than other structures, such as bone. Average healing times are as follows:  Grade 1: 2-6 weeks.  Grade 2: 8-12 weeks.  Grade 3: 12-16 weeks. RELATED COMPLICATIONS   Longer healing time, if activity is resumed too  soon.  Frequently recurring symptoms and repeated injury, resulting in a chronic problem.  Injury to other structures (bone, cartilage, or tendon).  Arthritis of the affected joint.  Prolonged impairment (sometimes).  Finger stiffness. TREATMENT Treatment first consists of ice and medicine, to reduce pain and inflammation. Compression bandages and elevation may help reduce inflammation and discomfort. After swelling goes down, the joint should be restrained for a length of time prescribed by your caregiver. After restraint, stretching and strengthening exercises are needed. Exercises may be completed at home or with a therapist. Rarely, surgical treatment is needed. Taping may be advised, when returning to sports.  MEDICATION   If pain medicine is needed, nonsteroidal anti-inflammatory medicines (aspirin and ibuprofen), or other minor pain relievers (acetaminophen), are often advised.  Do not take pain medicine for 7 days before surgery.  Stronger pain relievers may be prescribed by your caregiver. Use only as directed and only as much as you need. HEAT AND COLD  Cold treatment (icing) relieves pain and reduces inflammation. Cold treatment should be applied for 10 to 15 minutes every 2 to 3 hours, and immediately after activity that aggravates your symptoms. Use ice packs or an ice massage.  Heat treatment may be used before performing stretching and strengthening activities prescribed by your caregiver, physical therapist, or athletic trainer. Use a heat pack or a warm water soak. SEEK MEDICAL CARE IF:   Pain, swelling, or bruising gets worse, despite treatment, or you experience persistent pain lasting more than 2 to 4 weeks.  You experience pain, numbness, discoloration, or coldness in the hand or fingers. Blue, gray, or dark color appears in the fingernails.  Any of the following occur after surgery: increased pain, swelling, redness, drainage of fluids, bleeding in the affected  area, or signs of infection, including fever.  New, unexplained symptoms develop. (Drugs used in treatment may produce side effects.) Document Released: 09/04/2005 Document Revised: 11/27/2011 Document Reviewed: 12/17/2008 [employer] [employer] Patient Information 2013 [place], [place].

## 2012-12-02 ENCOUNTER — Telehealth: Payer: Self-pay

## 2012-12-02 MED ORDER — LISINOPRIL 2.5 MG PO TABS: 2.5000 mg | ORAL_TABLET | Freq: Every day | ORAL | Status: DC

## 2012-12-02 NOTE — Telephone Encounter (Signed)
Sent Lisinopril to pharmacy.

## 2012-12-24 ENCOUNTER — Ambulatory Visit: Payer: Medicare Other | Admitting: Family Medicine

## 2012-12-25 ENCOUNTER — Ambulatory Visit: Payer: Medicare Other | Admitting: Family Medicine

## 2012-12-27 ENCOUNTER — Other Ambulatory Visit: Payer: Self-pay | Admitting: *Deleted

## 2012-12-27 NOTE — Telephone Encounter (Signed)
Called pt to inform her that she will need to make an appt prior to receiving any refills on her DM meds.Hannah Rojas

## 2013-01-02 ENCOUNTER — Other Ambulatory Visit: Payer: Self-pay | Admitting: *Deleted

## 2013-01-02 MED ORDER — GLIMEPIRIDE 2 MG PO TABS: ORAL_TABLET | ORAL | Status: DC

## 2013-01-06 ENCOUNTER — Encounter: Payer: Self-pay | Admitting: Family Medicine

## 2013-01-06 ENCOUNTER — Ambulatory Visit (INDEPENDENT_AMBULATORY_CARE_PROVIDER_SITE_OTHER): Payer: Medicare Other | Admitting: Family Medicine

## 2013-01-06 VITALS — BP 140/86 | HR 96 | Ht 61.0 in | Wt 177.0 lb

## 2013-01-06 DIAGNOSIS — Z9081 Acquired absence of spleen: Secondary | ICD-10-CM

## 2013-01-06 DIAGNOSIS — Z9089 Acquired absence of other organs: Secondary | ICD-10-CM

## 2013-01-06 DIAGNOSIS — I951 Orthostatic hypotension: Secondary | ICD-10-CM

## 2013-01-06 DIAGNOSIS — R5383 Other fatigue: Secondary | ICD-10-CM

## 2013-01-06 DIAGNOSIS — E119 Type 2 diabetes mellitus without complications: Secondary | ICD-10-CM

## 2013-01-06 MED ORDER — SIMVASTATIN 20 MG PO TABS: 20.0000 mg | ORAL_TABLET | Freq: Every evening | ORAL | Status: DC

## 2013-01-06 MED ORDER — GLIMEPIRIDE 2 MG PO TABS: 2.0000 mg | ORAL_TABLET | Freq: Every day | ORAL | Status: DC

## 2013-01-06 MED ORDER — LISINOPRIL 2.5 MG PO TABS: 2.5000 mg | ORAL_TABLET | Freq: Every day | ORAL | Status: DC

## 2013-01-06 NOTE — Progress Notes (Signed)
  Subjective:    Patient ID: Hannah Rojas, female    DOB: 1946/02/27, 67 y.o.   MRN: [id number]  HPI DM - no hypoglycemic evetns. No wounds that aren't healing well. Stopped her pravastain after a month. Was making her dizzy.    Fatigue-she still just feels very tired and she's not sure why. She wonders if it could be the medications.  Review of Systems     Objective:   Physical Exam  Constitutional: She is oriented to person, place, and time. She appears well-developed and well-nourished.  HENT:  Head: Normocephalic and atraumatic.  Neck: Neck supple. No thyromegaly present.  Cardiovascular: Normal rate, regular rhythm and normal heart sounds.   Pulmonary/Chest: Effort normal and breath sounds normal.  Lymphadenopathy:    She has no cervical adenopathy.  Neurological: She is alert and oriented to person, place, and time.  Skin: Skin is warm and dry.  Psychiatric: She has a normal mood and affect. Her behavior is normal.          Assessment & Plan:  DM- well-controlled. Hemoglobin A1c is 6.6 today. We will try a different statin.  Will send her for new prescription for simvastatin hopefully she'll tolerate it well without any dizziness or side effects.  Fatigue-she had a normal CBC in January. She also had normal thyroid level about 8 months ago. No significant changes since then. Certainly it could be a medication side effect. She did stop her lisinopril and her glimepiride since she is on very low doses of medication. She does have them for a couple weeks and see if she feels better. She doesn't restart one medication at time to figure out which one might be causing her symptoms. She started off for the statin so I would think that she would be feeling better for or from that medication. If she is still not feeling well after couple weeks off both medications and please call me back and may consider further workup. 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

## 2013-03-05 ENCOUNTER — Other Ambulatory Visit: Payer: Self-pay | Admitting: Family Medicine

## 2013-03-27 ENCOUNTER — Other Ambulatory Visit: Payer: Self-pay

## 2013-05-05 ENCOUNTER — Encounter: Payer: Self-pay | Admitting: Family Medicine

## 2013-05-05 ENCOUNTER — Ambulatory Visit (INDEPENDENT_AMBULATORY_CARE_PROVIDER_SITE_OTHER): Payer: Medicare Other | Admitting: Family Medicine

## 2013-05-05 VITALS — BP 125/79 | HR 92 | Wt 173.0 lb

## 2013-05-05 DIAGNOSIS — I951 Orthostatic hypotension: Secondary | ICD-10-CM

## 2013-05-05 NOTE — Progress Notes (Signed)
CC: Hannah Rojas is a 67 y.o. female is here for dizziness x 3 days   Subjective: HPI:  Patient complains of dizziness that has been present for the past 2-3 weeks. It is present on a daily basis. Is of moderate severity. It occurs suddenly every time she gets up quickly from a seated to standing position it will last a matter of seconds and improves without any intervention. Described as the room spinning. She never has this in any other situation. Nothing else seems to make better or worse it can occur anytime of the day. Overall has not been getting better or worsens onset. Denies motor or sensory disturbances otherwise. Denies vision loss, hearing loss, tinnitus, confusion, coordination difficulty, nasal congestion, facial pressure, chest pain, irregular heart beat nor rapid heartbeat. Denies shortness of breath cough or wheezing.   Review Of Systems Outlined In HPI  Past Medical History  Diagnosis Date  . Cataracts, bilateral     bilateral  . H. pylori infection     history  . Pancreatic cyst     hx mucinous cystoadenoma     No family history on file.   History  Substance Use Topics  . Smoking status: Never Smoker   . Smokeless tobacco: Not on file  . Alcohol Use: No     Objective: Filed Vitals:   05/05/13 1512  BP: 125/79  Pulse: 92    General: Alert and Oriented, No Acute Distress HEENT: Pupils equal, round, reactive to light. Conjunctivae clear.  External ears unremarkable, canals clear with intact TMs with appropriate landmarks.  Middle ear appears open without effusion. Pink inferior turbinates.  Moist mucous membranes, pharynx without inflammation nor lesions.  Neck supple without palpable lymphadenopathy nor abnormal masses. Neuro: CN II-XII grossly intact, full strength/rom of all four extremities Lungs: Clear to auscultation bilaterally, no wheezing/ronchi/rales.  Comfortable work of breathing. Good air movement. Cardiac: Regular rate and rhythm. Normal  S1/S2.  No murmurs, rubs, nor gallops.  No carotid bruits Extremities: No peripheral edema.  Strong peripheral pulses.  Mental Status: No depression, anxiety, nor agitation. Skin: Warm and dry.  Assessment & Plan: [name] was seen today for dizziness x 3 days.  Diagnoses and associated orders for this visit:  Orthostatic hypotension    Discussed with patient that objective data and subjective presentation of dizziness is most consistent with orthostatic hypotension. I've encouraged her to try lisinopril every other day however symptoms continued stop completely. Counseled that could also consider slightly increasing salt in diet. Discussed keeping lisinopril add any dose of all possible for renal protection. She will followup with PCP in 1-2 weeks  Return in about 1 week (around 05/12/2013).

## 2013-05-12 ENCOUNTER — Ambulatory Visit (INDEPENDENT_AMBULATORY_CARE_PROVIDER_SITE_OTHER): Payer: Medicare Other | Admitting: Family Medicine

## 2013-05-12 ENCOUNTER — Ambulatory Visit: Payer: Medicare Other | Admitting: Family Medicine

## 2013-05-12 ENCOUNTER — Encounter: Payer: Self-pay | Admitting: Family Medicine

## 2013-05-12 VITALS — BP 127/70 | HR 81 | Ht 61.0 in | Wt 171.0 lb

## 2013-05-12 DIAGNOSIS — Z1231 Encounter for screening mammogram for malignant neoplasm of breast: Secondary | ICD-10-CM

## 2013-05-12 DIAGNOSIS — E119 Type 2 diabetes mellitus without complications: Secondary | ICD-10-CM

## 2013-05-12 DIAGNOSIS — M25473 Effusion, unspecified ankle: Secondary | ICD-10-CM

## 2013-05-12 DIAGNOSIS — M79609 Pain in unspecified limb: Secondary | ICD-10-CM

## 2013-05-12 DIAGNOSIS — M79672 Pain in left foot: Secondary | ICD-10-CM

## 2013-05-12 DIAGNOSIS — Z23 Encounter for immunization: Secondary | ICD-10-CM

## 2013-05-12 MED ORDER — GLIMEPIRIDE 1 MG PO TABS: 1.0000 mg | ORAL_TABLET | Freq: Every day | ORAL | Status: DC

## 2013-05-12 NOTE — Progress Notes (Signed)
Subjective:    Patient ID: Hannah Rojas, female    DOB: 1946-08-22, 67 y.o.   MRN: [id number]  HPI DM- no hypoglycemic evetns and not high. No wounds that aren't healing well.  Cut lisinpril to every other day bc of dizziness. She is feeling much better.  Has noticed some occ ankle swelling. She wonders if she would benefit from a diuretic. She says overall she eats a low salt diet.   CKD- 3 not tolerating lisinopril well.  She was getting lightheaded and dizzy. She saw my partner Dr.Hommel, he had her decrease her lisinopril to every other day.    She's also had pain on the edge of the lateral left heel. She denies any trauma or irritation. She December twisting her ankle a few years ago. She's not receiving medical care for the issue at that time as it didn't really seem to bother her much afterwards. 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  It bothers her more when she's been up on her foot more. No other alleviating factors. She has not tried icing it. She has not tried any over-the-counter pain relievers.  Review of Systems  BP 127/70  Pulse 81  Ht 5\' 1"  (1.549 m)  Wt 171 lb (77.565 kg)  BMI 32.33 kg/m2    Allergies  Allergen Reactions  . Iodinated Diagnostic Agents     IV Contrast  . Naproxen Sodium   . Pravastatin Other (See Comments)    dizziness  . Sulfonamide Derivatives     REACTION: rash  . Trazodone And Nefazodone Other (See Comments)    Nightmares     Past Medical History  Diagnosis Date  . Cataracts, bilateral     bilateral  . H. pylori infection     history  . Pancreatic cyst     hx mucinous cystoadenoma    Past Surgical History  Procedure Laterality Date  . Cholecystectomy    . Esophagogastroduodenoscopy    . Pancreatic cyst excision    . Spleenectomy      History   Social History  . Marital Status: Widowed    Spouse Name: N/A    Number of Children: N/A  . Years of Education: N/A   Occupational History  . Not on file.   Social History Main Topics   . Smoking status: Never Smoker   . Smokeless tobacco: Not on file  . Alcohol Use: No  . Drug Use: Not on file  . Sexual Activity:      Comment: works at [employer]   Other Topics Concern  . Not on file   Social History Narrative  . No narrative on file    No family history on file.  Outpatient Encounter Prescriptions as of 05/12/2013  Medication Sig Dispense Refill  . AMBULATORY NON FORMULARY MEDICATION Medication Name: Glucometer Dx 250.00, Diabetes.  1 Units  0  . EASY TOUCH TEST test strip TEST ONCE DAILY  50 each  1  . fish oil-omega-3 fatty acids 1000 MG capsule Take 2 g by mouth 2 (two) times daily.       Marland Kitchen glimepiride (AMARYL) 1 MG tablet Take 1 tablet (1 mg total) by mouth daily before breakfast.  90 tablet  1  . lisinopril (ZESTRIL) 2.5 MG tablet One by mouth every other day, stop if dizziness continues.  90 tablet  3  . Multiple Vitamins-Minerals (CENTRUM SILVER PO) Take by mouth.      . [DISCONTINUED] glimepiride (AMARYL) 2 MG tablet Take 1 tablet (2 mg  total) by mouth daily before breakfast.  90 tablet  1  . [DISCONTINUED] simvastatin (ZOCOR) 20 MG tablet Take 1 tablet (20 mg total) by mouth every evening.  30 tablet  6   No facility-administered encounter medications on file as of 05/12/2013.          Objective:   Physical Exam  Constitutional: She is oriented to person, place, and time. She appears well-developed and well-nourished.  HENT:  Head: Normocephalic and atraumatic.  Cardiovascular: Normal rate, regular rhythm and normal heart sounds.   Pulmonary/Chest: Effort normal and breath sounds normal.  Musculoskeletal: She exhibits no edema.  Left lateral heel is mildly tender. No swelling. No erythema. She has very dry skin over her heels but no cracking. Ankle with normal range of motion. No rash.  Neurological: She is alert and oriented to person, place, and time.  Skin: Skin is warm and dry.  Psychiatric: She has a normal mood and affect. Her behavior is  normal.          Assessment & Plan:  DM- well controlled.  Will dec glimepiride to 1mg  dose.  Lisinopril every other day. Not on a statin.  She had done great with her diet and exercise. I am really hoping as she loses weight that we might even be able to downgrade her to impaired fasting glucose. Lab Results  Component Value Date   HGBA1C 6.2 05/12/2013    Ankle swelling - we discussed wearing compression stockings. No significant swelling on exam today. No chest pain or shortness of breath consistent with congestive heart later. She says it does come and go. Minimize salt intake and work on wearing compression.  CKD-3 -  continue lisinopril every other day.  If blood pressure goes low again and she starts to feel lightheaded and dizzy then we may need to discontinue it completely.  Lateral heel pain-unclear etiology. No trauma to indicate a fracture. Is really no ligament attachments in this area. Recommend getting more cushioned shoes. She says she had planned on buying new work shoes anyway. See if he'll starts to feel better in the next 2-3 weeks if not then please let me know and we'll start with at least a plain film x-ray to rule out any fracture.  Can try Tylenol or Motrin for pain relief.  Due for screening mammogram. Will place order.

## 2013-05-12 NOTE — Patient Instructions (Signed)
Remember to get your eye exam. We will call to get your appt.

## 2013-05-15 ENCOUNTER — Ambulatory Visit (INDEPENDENT_AMBULATORY_CARE_PROVIDER_SITE_OTHER): Payer: Medicare Other

## 2013-05-15 ENCOUNTER — Other Ambulatory Visit: Payer: Self-pay | Admitting: Family Medicine

## 2013-05-15 DIAGNOSIS — Z1231 Encounter for screening mammogram for malignant neoplasm of breast: Secondary | ICD-10-CM

## 2013-05-15 LAB — LIPID PANEL
Cholesterol: 198 mg/dL (ref 0–200)
HDL: 69 mg/dL (ref >39)
Total CHOL/HDL Ratio: 2.9 Ratio
Triglycerides: 205 mg/dL — ABNORMAL HIGH (ref <150)
VLDL: 41 mg/dL — ABNORMAL HIGH (ref 0–40)

## 2013-05-15 LAB — COMPLETE METABOLIC PANEL WITH GFR
AST: 82 U/L — ABNORMAL HIGH (ref 0–37)
Alkaline Phosphatase: 90 U/L (ref 39–117)
BUN: 13 mg/dL (ref 6–23)
Creat: 1.14 mg/dL — ABNORMAL HIGH (ref 0.50–1.10)
GFR, Est Non African American: 50 mL/min — ABNORMAL LOW
Glucose, Bld: 103 mg/dL — ABNORMAL HIGH (ref 70–99)
Total Bilirubin: 0.6 mg/dL (ref 0.3–1.2)

## 2013-05-15 LAB — CBC
HCT: 40.1 % (ref 36.0–46.0)
MCV: 91.8 fL (ref 78.0–100.0)
RBC: 4.37 MIL/uL (ref 3.87–5.11)
WBC: 7.9 10*3/uL (ref 4.0–10.5)

## 2013-05-16 ENCOUNTER — Other Ambulatory Visit: Payer: Self-pay | Admitting: Family Medicine

## 2013-05-16 MED ORDER — LOVASTATIN 20 MG PO TABS: 20.0000 mg | ORAL_TABLET | Freq: Every day | ORAL | Status: DC

## 2013-05-20 ENCOUNTER — Telehealth: Payer: Self-pay | Admitting: *Deleted

## 2013-05-20 LAB — HEPATITIS PANEL, ACUTE
HCV Ab: REACTIVE — AB
Hep A IgM: NEGATIVE
Hep B C IgM: NEGATIVE

## 2013-05-20 MED ORDER — TRAMADOL HCL 50 MG PO TABS: 50.0000 mg | ORAL_TABLET | Freq: Three times a day (TID) | ORAL | Status: DC | PRN

## 2013-05-20 NOTE — Telephone Encounter (Signed)
LMOM that Tramadol had been sent to RX.  Meyer Cory, LPN

## 2013-05-20 NOTE — Telephone Encounter (Signed)
Pt is asking what she can take for a headache since she can't take Advil, tylenol or Ibuprofen. She states she will call; back after 3pm today. Please advise.  Meyer Cory, LPN

## 2013-05-20 NOTE — Telephone Encounter (Signed)
Will call in some tramadol.

## 2013-06-25 ENCOUNTER — Other Ambulatory Visit: Payer: Self-pay | Admitting: Family Medicine

## 2013-08-11 ENCOUNTER — Other Ambulatory Visit: Payer: Self-pay | Admitting: Family Medicine

## 2013-08-11 ENCOUNTER — Ambulatory Visit (INDEPENDENT_AMBULATORY_CARE_PROVIDER_SITE_OTHER): Payer: Medicare Other | Admitting: Family Medicine

## 2013-08-11 ENCOUNTER — Encounter: Payer: Self-pay | Admitting: Family Medicine

## 2013-08-11 VITALS — BP 123/67 | HR 86 | Temp 97.7°F | Ht 61.0 in | Wt 170.0 lb

## 2013-08-11 DIAGNOSIS — N183 Chronic kidney disease, stage 3 unspecified: Secondary | ICD-10-CM

## 2013-08-11 DIAGNOSIS — E119 Type 2 diabetes mellitus without complications: Secondary | ICD-10-CM

## 2013-08-11 DIAGNOSIS — R748 Abnormal levels of other serum enzymes: Secondary | ICD-10-CM

## 2013-08-11 MED ORDER — TRAMADOL HCL 50 MG PO TABS: 50.0000 mg | ORAL_TABLET | Freq: Three times a day (TID) | ORAL | Status: DC | PRN

## 2013-08-11 MED ORDER — LOVASTATIN 20 MG PO TABS: 20.0000 mg | ORAL_TABLET | Freq: Every day | ORAL | Status: DC

## 2013-08-11 NOTE — Progress Notes (Signed)
Subjective:    Patient ID: Hannah Rojas, female    DOB: 29-Jun-1946, 67 y.o.   MRN: [id number]  HPI F/U DM- no hypoglycemic evetns.  No wounds that aren't healing. Added lovastatin about 3 months ago.  Tolerating well with no S.E.  Takes meds regularly. Will go to [facility] in Jan.   CKD - 3 - Stable, asymptomatic.   Hypertriglyceridemia - She has stopped her fish oil.  Now on statin for her DM.   Review of Systems  BP 123/67  Pulse 86  Temp(Src) 97.7 F (36.5 C)  Ht 5\' 1"  (1.549 m)  Wt 170 lb (77.111 kg)  BMI 32.14 kg/m2    Allergies  Allergen Reactions  . Iodinated Diagnostic Agents     IV Contrast  . Naproxen Sodium   . Pravastatin Other (See Comments)    dizziness  . Simvastatin Other (See Comments)    Nightmares  . Sulfonamide Derivatives     REACTION: rash  . Trazodone And Nefazodone Other (See Comments)    Nightmares     Past Medical History  Diagnosis Date  . Cataracts, bilateral     bilateral  . H. pylori infection     history  . Pancreatic cyst     hx mucinous cystoadenoma    Past Surgical History  Procedure Laterality Date  . Cholecystectomy    . Esophagogastroduodenoscopy    . Pancreatic cyst excision    . Spleenectomy      History   Social History  . Marital Status: Widowed    Spouse Name: N/A    Number of Children: N/A  . Years of Education: N/A   Occupational History  . Not on file.   Social History Main Topics  . Smoking status: Never Smoker   . Smokeless tobacco: Not on file  . Alcohol Use: No  . Drug Use: Not on file  . Sexual Activity:      Comment: works at [employer]   Other Topics Concern  . Not on file   Social History Narrative  . No narrative on file    No family history on file.  Outpatient Encounter Prescriptions as of 08/11/2013  Medication Sig  . AMBULATORY NON FORMULARY MEDICATION Medication Name: Glucometer Dx 250.00, Diabetes.  Marland Kitchen EASY TOUCH TEST test strip TEST ONCE DAILY  . glimepiride  (AMARYL) 1 MG tablet Take 1 tablet (1 mg total) by mouth daily before breakfast.  . lisinopril (ZESTRIL) 2.5 MG tablet One by mouth every other day, stop if dizziness continues.  Marland Kitchen lovastatin (MEVACOR) 20 MG tablet Take 1 tablet (20 mg total) by mouth at bedtime.  . Multiple Vitamins-Minerals (CENTRUM SILVER PO) Take by mouth.  . traMADol (ULTRAM) 50 MG tablet Take 1 tablet (50 mg total) by mouth every 8 (eight) hours as needed.  . [DISCONTINUED] lovastatin (MEVACOR) 20 MG tablet Take 1 tablet (20 mg total) by mouth at bedtime.  . [DISCONTINUED] traMADol (ULTRAM) 50 MG tablet Take 1 tablet (50 mg total) by mouth every 8 (eight) hours as needed for pain.  . [DISCONTINUED] fish oil-omega-3 fatty acids 1000 MG capsule Take 2 g by mouth 2 (two) times daily.           Objective:   Physical Exam  Constitutional: She is oriented to person, place, and time. She appears well-developed and well-nourished.  HENT:  Head: Normocephalic and atraumatic.  Cardiovascular: Normal rate, regular rhythm and normal heart sounds.   Pulmonary/Chest: Effort normal and breath sounds normal.  Neurological: She is alert and oriented to person, place, and time.  Skin: Skin is warm and dry.  Psychiatric: She has a normal mood and affect. Her behavior is normal.          Assessment & Plan:  DM - Well controlled.  On ACE, statin.  F/U in 3-4 months. Due for urine micro today.  Lab Results  Component Value Date   HGBA1C 6.6 08/11/2013   Abnormal liver enzymes-due to recheck today.   CKD-3  - Stable.  Lab Results  Component Value Date   CREATININE 1.14* 05/15/2013   Hypertriglyceridemia - due to recheck TG.  Off fish oil but on statin now.

## 2013-08-13 ENCOUNTER — Ambulatory Visit: Payer: Medicare Other | Admitting: Family Medicine

## 2013-08-13 LAB — COMPLETE METABOLIC PANEL WITH GFR
Albumin: 4.3 g/dL (ref 3.5–5.2)
Alkaline Phosphatase: 74 U/L (ref 39–117)
GFR, Est Non African American: 60 mL/min
Glucose, Bld: 104 mg/dL — ABNORMAL HIGH (ref 70–99)
Potassium: 4.5 mEq/L (ref 3.5–5.3)
Sodium: 139 mEq/L (ref 135–145)
Total Protein: 7.4 g/dL (ref 6.0–8.3)

## 2013-08-13 LAB — LIPID PANEL
LDL Cholesterol: 57 mg/dL (ref 0–99)
Total CHOL/HDL Ratio: 2.3 Ratio
VLDL: 31 mg/dL (ref 0–40)

## 2013-08-13 LAB — GAMMA GT: GGT: 18 U/L (ref 7–51)

## 2013-08-18 ENCOUNTER — Other Ambulatory Visit: Payer: Self-pay | Admitting: Family Medicine

## 2013-10-01 ENCOUNTER — Telehealth: Payer: Self-pay | Admitting: *Deleted

## 2013-10-01 DIAGNOSIS — I951 Orthostatic hypotension: Secondary | ICD-10-CM

## 2013-10-01 MED ORDER — LISINOPRIL 2.5 MG PO TABS: ORAL_TABLET | ORAL | Status: DC

## 2013-10-01 MED ORDER — LOVASTATIN 20 MG PO TABS: 20.0000 mg | ORAL_TABLET | Freq: Every day | ORAL | Status: DC

## 2013-10-01 MED ORDER — GLIMEPIRIDE 1 MG PO TABS: 1.0000 mg | ORAL_TABLET | Freq: Every day | ORAL | Status: DC

## 2013-10-01 MED ORDER — TRAMADOL HCL 50 MG PO TABS: 50.0000 mg | ORAL_TABLET | Freq: Three times a day (TID) | ORAL | Status: DC | PRN

## 2013-10-01 NOTE — Telephone Encounter (Signed)
Pt called stating that she would like to begin getting her rx's through mail order. [facility] [phone]. Pt's rx's ordered and sent.Hannah Rojas Velva

## 2013-10-09 ENCOUNTER — Other Ambulatory Visit: Payer: Self-pay | Admitting: Family Medicine

## 2013-11-17 ENCOUNTER — Ambulatory Visit: Payer: Medicare Other | Admitting: Family Medicine

## 2013-11-19 ENCOUNTER — Encounter: Payer: Self-pay | Admitting: Family Medicine

## 2013-11-19 ENCOUNTER — Ambulatory Visit (INDEPENDENT_AMBULATORY_CARE_PROVIDER_SITE_OTHER): Payer: Commercial Managed Care - HMO | Admitting: Family Medicine

## 2013-11-19 VITALS — BP 116/73 | HR 79 | Wt 172.0 lb

## 2013-11-19 DIAGNOSIS — E669 Obesity, unspecified: Secondary | ICD-10-CM | POA: Insufficient documentation

## 2013-11-19 DIAGNOSIS — Z6824 Body mass index (BMI) 24.0-24.9, adult: Secondary | ICD-10-CM | POA: Insufficient documentation

## 2013-11-19 DIAGNOSIS — N189 Chronic kidney disease, unspecified: Secondary | ICD-10-CM

## 2013-11-19 DIAGNOSIS — Z6825 Body mass index (BMI) 25.0-25.9, adult: Secondary | ICD-10-CM | POA: Insufficient documentation

## 2013-11-19 DIAGNOSIS — E785 Hyperlipidemia, unspecified: Secondary | ICD-10-CM

## 2013-11-19 DIAGNOSIS — E119 Type 2 diabetes mellitus without complications: Secondary | ICD-10-CM

## 2013-11-19 LAB — POCT UA - MICROALBUMIN
Albumin/Creatinine Ratio, Urine, POC: <30
Creatinine, POC: 100 mg/dL
MICROALBUMIN (UR) POC: 30 mg/L

## 2013-11-19 LAB — POCT GLYCOSYLATED HEMOGLOBIN (HGB A1C): Hemoglobin A1C: 6.9

## 2013-11-19 NOTE — Progress Notes (Signed)
   Subjective:    Patient ID: Hannah Rojas, female    DOB: Jan 31, 1946, 68 y.o.   MRN: [id number]  HPI Diabetes - no hypoglycemic events. No wounds or sores that are not healing well. No increased thirst or urination. Checking glucose at home. Taking medications as prescribed without any side effects.  Has a cold but she is getting better.   Lab Results  Component Value Date   HGBA1C 6.6 08/11/2013     CKD- Lab Results  Component Value Date   CREATININE 0.98 08/11/2013   Hyperlipidemia - Says stopped her mevacor 2 weeks ago because started having nightmares. She is willing to retry it.    Review of Systems     Objective:   Physical Exam  Constitutional: She is oriented to person, place, and time. She appears well-developed and well-nourished.  HENT:  Head: Normocephalic and atraumatic.  Cardiovascular: Normal rate, regular rhythm and normal heart sounds.   Pulmonary/Chest: Effort normal and breath sounds normal.  Neurological: She is alert and oriented to person, place, and time.  Skin: Skin is warm and dry.  Psychiatric: She has a normal mood and affect. Her behavior is normal.          Assessment & Plan:  Diabetes- well controlled.  A1C is 6.9 today.  Recommend starting walking program.  On ACE. Was on statin.    Hyperlipidemia - will retry mevacor. Call if nightmares recur. O/W will recheck lipids at f/u in 3 months.     CKD-recheck kidney function.

## 2013-12-01 ENCOUNTER — Other Ambulatory Visit: Payer: Self-pay | Admitting: *Deleted

## 2013-12-01 ENCOUNTER — Other Ambulatory Visit: Payer: Self-pay | Admitting: Family Medicine

## 2013-12-01 MED ORDER — GLUCOSE BLOOD VI STRP: ORAL_STRIP | Status: DC

## 2014-02-19 ENCOUNTER — Ambulatory Visit (INDEPENDENT_AMBULATORY_CARE_PROVIDER_SITE_OTHER): Payer: Commercial Managed Care - HMO | Admitting: Family Medicine

## 2014-02-19 ENCOUNTER — Encounter: Payer: Self-pay | Admitting: Family Medicine

## 2014-02-19 VITALS — BP 118/57 | HR 71 | Ht 61.0 in | Wt 171.0 lb

## 2014-02-19 DIAGNOSIS — E119 Type 2 diabetes mellitus without complications: Secondary | ICD-10-CM

## 2014-02-19 DIAGNOSIS — E669 Obesity, unspecified: Secondary | ICD-10-CM

## 2014-02-19 DIAGNOSIS — N183 Chronic kidney disease, stage 3 unspecified: Secondary | ICD-10-CM

## 2014-02-19 LAB — POCT GLYCOSYLATED HEMOGLOBIN (HGB A1C): Hemoglobin A1C: 6.9

## 2014-02-19 LAB — LIPID PANEL
CHOLESTEROL: 193 mg/dL (ref 0–200)
HDL: 56 mg/dL (ref >39)
LDL Cholesterol: 104 mg/dL — ABNORMAL HIGH (ref 0–99)
Total CHOL/HDL Ratio: 3.4 Ratio
Triglycerides: 163 mg/dL — ABNORMAL HIGH (ref <150)
VLDL: 33 mg/dL (ref 0–40)

## 2014-02-19 LAB — COMPLETE METABOLIC PANEL WITH GFR
ALBUMIN: 4.2 g/dL (ref 3.5–5.2)
ALT: 71 U/L — ABNORMAL HIGH (ref 0–35)
AST: 52 U/L — ABNORMAL HIGH (ref 0–37)
Alkaline Phosphatase: 76 U/L (ref 39–117)
BUN: 16 mg/dL (ref 6–23)
CO2: 25 meq/L (ref 19–32)
Calcium: 10.2 mg/dL (ref 8.4–10.5)
Chloride: 103 mEq/L (ref 96–112)
Creat: 1.05 mg/dL (ref 0.50–1.10)
GFR, EST AFRICAN AMERICAN: 64 mL/min
GFR, Est Non African American: 55 mL/min — ABNORMAL LOW
Glucose, Bld: 108 mg/dL — ABNORMAL HIGH (ref 70–99)
POTASSIUM: 4.4 meq/L (ref 3.5–5.3)
SODIUM: 140 meq/L (ref 135–145)
TOTAL PROTEIN: 7.7 g/dL (ref 6.0–8.3)
Total Bilirubin: 0.9 mg/dL (ref 0.2–1.2)

## 2014-02-19 MED ORDER — METFORMIN HCL 500 MG PO TABS: 500.0000 mg | ORAL_TABLET | Freq: Two times a day (BID) | ORAL | Status: DC

## 2014-02-19 NOTE — Progress Notes (Signed)
   Subjective:    Patient ID: Hannah Rojas, female    DOB: 12-29-1945, 68 y.o.   MRN: [id number]  HPI Diabetes - no hypoglycemic events. No wounds or sores that are not healing well. No increased thirst or urination. Checking glucose at home. Taking medications as prescribed without any side effects.  CKD 3 - no changes in urination. ON an ACEi.  Lab Results  Component Value Date   CREATININE 0.98 08/11/2013    Review of Systems     Objective:   Physical Exam  Constitutional: She is oriented to person, place, and time. She appears well-developed and well-nourished.  HENT:  Head: Normocephalic and atraumatic.  Neck: Neck supple. No thyromegaly present.  Cardiovascular: Normal rate, regular rhythm and normal heart sounds.   Pulmonary/Chest: Effort normal and breath sounds normal.  Lymphadenopathy:    She has no cervical adenopathy.  Neurological: She is alert and oriented to person, place, and time.  Skin: Skin is warm and dry.  Psychiatric: She has a normal mood and affect. Her behavior is normal.          Assessment & Plan:  DM- well controlled on current regimen. Continue with diet and exercise. Would like to consider switching her glimepiride to metformin. I am concerned about her weight gain. She's gained 16 pounds over the last 4 years and feels like she really hasn't changed any of her regimen. She was on metformin years ago but was stopped and she really doesn't remember why. They do not have it listed on her intolerance list we will try this again. Followup in 3 months. She is on ACE inhibitor. She is not on a statin, as she is intolerant to statins. Continue to control diet. Lab Results  Component Value Date   HGBA1C 6.9 02/19/2014   CKD 3- Due to recheck function today.  BP well controlled.  DM well controlled.  Follow every 6 mo.

## 2014-03-16 ENCOUNTER — Other Ambulatory Visit: Payer: Self-pay | Admitting: Family Medicine

## 2014-03-31 ENCOUNTER — Telehealth: Payer: Self-pay

## 2014-03-31 MED ORDER — METFORMIN HCL ER 500 MG PO TB24: 500.0000 mg | ORAL_TABLET | Freq: Every day | ORAL | Status: DC

## 2014-03-31 NOTE — Telephone Encounter (Signed)
I will send a new Rx for ER

## 2014-03-31 NOTE — Telephone Encounter (Signed)
Pt came by the office today and wanted to know if she could switch from the regular Metformin to the time release Metformin. Please advise./Darryon Bastin,CMA

## 2014-04-01 NOTE — Telephone Encounter (Signed)
Pt informed.Hannah Rojas Hannah Rojas  

## 2014-05-21 ENCOUNTER — Ambulatory Visit (INDEPENDENT_AMBULATORY_CARE_PROVIDER_SITE_OTHER): Payer: Commercial Managed Care - HMO | Admitting: Family Medicine

## 2014-05-21 ENCOUNTER — Encounter: Payer: Self-pay | Admitting: Family Medicine

## 2014-05-21 ENCOUNTER — Ambulatory Visit (INDEPENDENT_AMBULATORY_CARE_PROVIDER_SITE_OTHER): Payer: Commercial Managed Care - HMO

## 2014-05-21 VITALS — BP 104/69 | HR 74 | Wt 169.0 lb

## 2014-05-21 DIAGNOSIS — Z1231 Encounter for screening mammogram for malignant neoplasm of breast: Secondary | ICD-10-CM

## 2014-05-21 DIAGNOSIS — Z23 Encounter for immunization: Secondary | ICD-10-CM

## 2014-05-21 DIAGNOSIS — E785 Hyperlipidemia, unspecified: Secondary | ICD-10-CM

## 2014-05-21 DIAGNOSIS — E119 Type 2 diabetes mellitus without complications: Secondary | ICD-10-CM

## 2014-05-21 LAB — POCT GLYCOSYLATED HEMOGLOBIN (HGB A1C): HEMOGLOBIN A1C: 6.6

## 2014-05-21 NOTE — Progress Notes (Signed)
   Subjective:    Patient ID: Hannah Rojas, female    DOB: 1946/04/06, 68 y.o.   MRN: [id number]  Diabetes   Diabetes - rarely has a  hypoglycemic events. No wounds or sores that are not healing well. No increased thirst or urination. Checking glucose at home. Taking medications as prescribed without any side effects.  Review of Systems     Objective:   Physical Exam  Constitutional: She is oriented to person, place, and time. She appears well-developed and well-nourished.  HENT:  Head: Normocephalic and atraumatic.  Cardiovascular: Normal rate, regular rhythm and normal heart sounds.   Pulmonary/Chest: Effort normal and breath sounds normal.  Neurological: She is alert and oriented to person, place, and time.  Skin: Skin is warm and dry.  Psychiatric: She has a normal mood and affect. Her behavior is normal.          Assessment & Plan:  DM-  Well controlled.  A1C is 6.6  Intolerant statins.  F/U in 3 months. On an ACE.   Hyperlipidemia - due to recheck direct LDL.  Goal under 100. Intolerant to statins.

## 2014-05-28 ENCOUNTER — Other Ambulatory Visit: Payer: Self-pay | Admitting: Family Medicine

## 2014-05-28 LAB — LDL CHOLESTEROL, DIRECT: Direct LDL: 107 mg/dL — ABNORMAL HIGH

## 2014-05-28 MED ORDER — ATORVASTATIN CALCIUM 40 MG PO TABS: 40.0000 mg | ORAL_TABLET | Freq: Every day | ORAL | Status: DC

## 2014-06-15 ENCOUNTER — Other Ambulatory Visit: Payer: Self-pay | Admitting: Family Medicine

## 2014-06-25 LAB — HM DIABETES EYE EXAM

## 2014-06-29 ENCOUNTER — Other Ambulatory Visit: Payer: Self-pay | Admitting: Family Medicine

## 2014-07-01 ENCOUNTER — Other Ambulatory Visit: Payer: Self-pay | Admitting: Family Medicine

## 2014-07-16 ENCOUNTER — Telehealth: Payer: Self-pay | Admitting: *Deleted

## 2014-07-16 NOTE — Telephone Encounter (Signed)
Hannah Rojas calls and reports that since she has been on the atorvastatin her AM blood sugars have been creeping up slowly and now she says that her level was 240 this morning. She is concerned and worried that her sugar is now out of control. She reports not taking her atorvastatin for a few days and her levels being significantly lowered. She denies illness or any new medications. Please advise. Margette Fast, CMA

## 2014-07-17 NOTE — Telephone Encounter (Signed)
OK, try cutting the atorvastatin in half adn see if works better. If still raising sugar levels then let me know.

## 2014-07-20 NOTE — Telephone Encounter (Signed)
Hannah Rojas was notified. Margette Fast, CMA

## 2014-08-20 ENCOUNTER — Encounter: Payer: Self-pay | Admitting: Family Medicine

## 2014-08-20 ENCOUNTER — Ambulatory Visit (INDEPENDENT_AMBULATORY_CARE_PROVIDER_SITE_OTHER): Payer: Commercial Managed Care - HMO | Admitting: Family Medicine

## 2014-08-20 VITALS — BP 118/72 | HR 82 | Temp 98.7°F | Wt 167.0 lb

## 2014-08-20 DIAGNOSIS — N183 Chronic kidney disease, stage 3 unspecified: Secondary | ICD-10-CM

## 2014-08-20 DIAGNOSIS — E119 Type 2 diabetes mellitus without complications: Secondary | ICD-10-CM

## 2014-08-20 LAB — BASIC METABOLIC PANEL WITH GFR
BUN: 13 mg/dL (ref 6–23)
CALCIUM: 9.7 mg/dL (ref 8.4–10.5)
CHLORIDE: 104 meq/L (ref 96–112)
CO2: 21 mEq/L (ref 19–32)
Creat: 0.9 mg/dL (ref 0.50–1.10)
GFR, Est African American: 77 mL/min
GFR, Est Non African American: 66 mL/min
GLUCOSE: 158 mg/dL — AB (ref 70–99)
Potassium: 4.4 mEq/L (ref 3.5–5.3)
Sodium: 138 mEq/L (ref 135–145)

## 2014-08-20 LAB — POCT GLYCOSYLATED HEMOGLOBIN (HGB A1C): Hemoglobin A1C: 8.2

## 2014-08-20 NOTE — Patient Instructions (Signed)
Call me in a couple of weeks with your blood sugars Hold the cholesterol pill

## 2014-08-20 NOTE — Progress Notes (Signed)
   Subjective:    Patient ID: Hannah Rojas, female    DOB: [birth year], 68 y.o.   MRN: [id number]  HPI Diabetes - no hypoglycemic events. No wounds or sores that are not healing well. No increased thirst or urination. Checking glucose at home.  Getting around 130.  Taking medications as prescribed without any side effects. Last A1C is 6.6.  Says when started taking the cholesterol pill her sugars went up to over 200.  She had called here and we recommended taking every other day and sugars went down some. Has been trying to exercise.  Has lost about 2 more lbs.     Review of Systems     Objective:   Physical Exam  Constitutional: She is oriented to person, place, and time. She appears well-developed and well-nourished.  HENT:  Head: Normocephalic and atraumatic.  Cardiovascular: Normal rate, regular rhythm and normal heart sounds.   Pulmonary/Chest: Effort normal and breath sounds normal.  Neurological: She is alert and oriented to person, place, and time.  Skin: Skin is warm and dry.  Psychiatric: She has a normal mood and affect. Her behavior is normal.          Assessment & Plan:  Diabetes -  Uncontrolled today. She feels like there was a distinct change in her blood sugars after starting a cholesterol pill. She says it was  Literally within a couple days of starting the cholesterol medication. Nothing else has changed. In fact she's actually been walking more at work. She denies any major changes in her diet and says her A1c has never been this high before. We will go ahead and have her hold the cholesterol medication. Encouraged her not throat away but just put it aside. I want to start checking her blood sugars are low but more frequently over the next couple of weeks and then call me with her numbers. If they sound like there well-controlled and we will continue that regimen and see her back in 3 months. Otherwise I'll adjust her regimen at that time.  CKD 3  - will recheck  kidney function today.

## 2014-08-24 NOTE — Progress Notes (Signed)
Quick Note:  Call pt. labs ok.  ______

## 2014-11-19 ENCOUNTER — Ambulatory Visit: Payer: Self-pay | Admitting: Family Medicine

## 2014-12-02 ENCOUNTER — Ambulatory Visit (INDEPENDENT_AMBULATORY_CARE_PROVIDER_SITE_OTHER): Payer: Commercial Managed Care - HMO | Admitting: Family Medicine

## 2014-12-02 ENCOUNTER — Encounter: Payer: Self-pay | Admitting: Family Medicine

## 2014-12-02 VITALS — BP 113/72 | HR 82 | Ht 61.0 in | Wt 168.0 lb

## 2014-12-02 DIAGNOSIS — E119 Type 2 diabetes mellitus without complications: Secondary | ICD-10-CM

## 2014-12-02 DIAGNOSIS — R1013 Epigastric pain: Secondary | ICD-10-CM | POA: Diagnosis not present

## 2014-12-02 DIAGNOSIS — Z23 Encounter for immunization: Secondary | ICD-10-CM | POA: Diagnosis not present

## 2014-12-02 DIAGNOSIS — E785 Hyperlipidemia, unspecified: Secondary | ICD-10-CM | POA: Diagnosis not present

## 2014-12-02 DIAGNOSIS — R140 Abdominal distension (gaseous): Secondary | ICD-10-CM

## 2014-12-02 LAB — COMPLETE METABOLIC PANEL WITH GFR
ALBUMIN: 4.2 g/dL (ref 3.5–5.2)
ALT: 45 U/L — ABNORMAL HIGH (ref 0–35)
AST: 40 U/L — ABNORMAL HIGH (ref 0–37)
Alkaline Phosphatase: 72 U/L (ref 39–117)
BUN: 14 mg/dL (ref 6–23)
CALCIUM: 9.4 mg/dL (ref 8.4–10.5)
CHLORIDE: 104 meq/L (ref 96–112)
CO2: 24 mEq/L (ref 19–32)
Creat: 1.02 mg/dL (ref 0.50–1.10)
GFR, EST AFRICAN AMERICAN: 65 mL/min
GFR, EST NON AFRICAN AMERICAN: 57 mL/min — AB
Glucose, Bld: 115 mg/dL — ABNORMAL HIGH (ref 70–99)
POTASSIUM: 4.7 meq/L (ref 3.5–5.3)
Sodium: 140 mEq/L (ref 135–145)
Total Bilirubin: 0.7 mg/dL (ref 0.2–1.2)
Total Protein: 7.3 g/dL (ref 6.0–8.3)

## 2014-12-02 LAB — CBC WITH DIFFERENTIAL/PLATELET
Basophils Absolute: 0 10*3/uL (ref 0.0–0.1)
Basophils Relative: 0 % (ref 0–1)
Eosinophils Absolute: 0.5 10*3/uL (ref 0.0–0.7)
Eosinophils Relative: 5 % (ref 0–5)
HEMATOCRIT: 37.8 % (ref 36.0–46.0)
HEMOGLOBIN: 12.9 g/dL (ref 12.0–15.0)
Lymphocytes Relative: 48 % — ABNORMAL HIGH (ref 12–46)
Lymphs Abs: 4.4 10*3/uL — ABNORMAL HIGH (ref 0.7–4.0)
MCH: 31.5 pg (ref 26.0–34.0)
MCHC: 34.1 g/dL (ref 30.0–36.0)
MCV: 92.2 fL (ref 78.0–100.0)
MONOS PCT: 8 % (ref 3–12)
MPV: 10.9 fL (ref 8.6–12.4)
Monocytes Absolute: 0.7 10*3/uL (ref 0.1–1.0)
Neutro Abs: 3.5 10*3/uL (ref 1.7–7.7)
Neutrophils Relative %: 39 % — ABNORMAL LOW (ref 43–77)
Platelets: 323 10*3/uL (ref 150–400)
RBC: 4.1 MIL/uL (ref 3.87–5.11)
RDW: 13.1 % (ref 11.5–15.5)
WBC: 9.1 10*3/uL (ref 4.0–10.5)

## 2014-12-02 LAB — LIPASE: Lipase: 179 U/L — ABNORMAL HIGH (ref 0–75)

## 2014-12-02 LAB — AMYLASE: AMYLASE: 72 U/L (ref 0–105)

## 2014-12-02 LAB — HEMOGLOBIN A1C: Hgb A1c MFr Bld: 7.5 % — AB (ref 4.0–6.0)

## 2014-12-02 MED ORDER — SITAGLIP PHOS-METFORMIN HCL ER 50-500 MG PO TB24: 1.0000 | ORAL_TABLET | Freq: Every day | ORAL | Status: DC

## 2014-12-02 NOTE — Patient Instructions (Signed)
Recommend a trial of probiotic to help with the bloating.

## 2014-12-02 NOTE — Progress Notes (Signed)
   Subjective:    Patient ID: Hannah Rojas, female    DOB: 1945-11-07, 69 y.o.   MRN: [id number]  HPI Diabetes - no hypoglycemic events. No wounds or sores that are not healing well. No increased thirst or urination. Checking glucose at home. Taking medications as prescribed without any side effects.  Sugars runnig 150-160.s    Hyperlipidemia-she has started Lipitor but felt like it was a direct correlation with an elevation in her blood sugars so we have stopped it her last office visit.  Has been very bloated in the epigastric area for about a month.  No other change in bowels.  No blood in the stool. Last colonoscopy 2 years ago. Eating aggrevates it. No abdominal pain. She denies any nausea or vomiting with it as well. She says just when she eats she tends to start feel very full and bloated in that area.  Review of Systems     Objective:   Physical Exam  Constitutional: She is oriented to person, place, and time. She appears well-developed and well-nourished.  HENT:  Head: Normocephalic and atraumatic.  Cardiovascular: Normal rate, regular rhythm and normal heart sounds.   Pulmonary/Chest: Effort normal and breath sounds normal.  Abdominal: Soft. Bowel sounds are normal. She exhibits no distension and no mass. There is tenderness. There is no rebound and no guarding.  Mild tender over the epigastric area.  Neurological: She is alert and oriented to person, place, and time.  Skin: Skin is warm and dry.  Psychiatric: She has a normal mood and affect. Her behavior is normal.          Assessment & Plan:  DM- uncontrolled but much improved from 8.2, now down to 7.5%. Think she's made great progress. I encouraged her to get back into her walking. She was a little bit more consistent with it before the winter and was making great strides in her weight loss which I think is fantastic. We will go ahead and switch her metformin to Janumet. It looks like it's covered on her  insurance. Follow-up in 3 months.  Bloating-we'll check liver and pancreatic enzymes as well as a CBC. She is a little bit tender in epigastric area. If everything comes back normal then recommend a trial of over-the-counter probiotic. She really doesn't eat much dairy so this is probably not an issue. Also encouraged her to pay attention to more gas producing foods like beings etc. and maybe cutting back on those.  Hyperlipidemia-recommend restart the atorvastatin may be a half a tab at bedtime and see if she tolerates this okay. If she feels like it's really driving up her blood sugars again then please call and let us know.

## 2014-12-02 NOTE — Addendum Note (Signed)
Addended by: Teddy Spike on: 12/02/2014 10:14 AM   Modules accepted: Orders

## 2014-12-03 ENCOUNTER — Other Ambulatory Visit: Payer: Self-pay | Admitting: *Deleted

## 2014-12-03 DIAGNOSIS — R748 Abnormal levels of other serum enzymes: Secondary | ICD-10-CM

## 2014-12-04 ENCOUNTER — Telehealth: Payer: Self-pay | Admitting: *Deleted

## 2014-12-04 NOTE — Telephone Encounter (Signed)
Called patient to inform Ultrasound has been ordered & they will call her to set up appt. Marland Kitchen.Zabrina Brotherton

## 2014-12-04 NOTE — Telephone Encounter (Signed)
-----   Message from Hali Marry, MD sent at 12/04/2014  7:58 AM EDT ----- Ordered.

## 2014-12-09 ENCOUNTER — Ambulatory Visit (INDEPENDENT_AMBULATORY_CARE_PROVIDER_SITE_OTHER): Payer: Commercial Managed Care - HMO

## 2014-12-09 DIAGNOSIS — Z9049 Acquired absence of other specified parts of digestive tract: Secondary | ICD-10-CM

## 2014-12-09 DIAGNOSIS — R748 Abnormal levels of other serum enzymes: Secondary | ICD-10-CM | POA: Diagnosis not present

## 2014-12-09 DIAGNOSIS — Z90411 Acquired partial absence of pancreas: Secondary | ICD-10-CM

## 2014-12-09 DIAGNOSIS — Z9081 Acquired absence of spleen: Secondary | ICD-10-CM

## 2014-12-14 ENCOUNTER — Other Ambulatory Visit: Payer: Self-pay | Admitting: Family Medicine

## 2014-12-15 ENCOUNTER — Other Ambulatory Visit: Payer: Self-pay | Admitting: Family Medicine

## 2014-12-15 DIAGNOSIS — R748 Abnormal levels of other serum enzymes: Secondary | ICD-10-CM

## 2014-12-21 ENCOUNTER — Ambulatory Visit (INDEPENDENT_AMBULATORY_CARE_PROVIDER_SITE_OTHER): Payer: Commercial Managed Care - HMO

## 2014-12-21 DIAGNOSIS — Z9049 Acquired absence of other specified parts of digestive tract: Secondary | ICD-10-CM

## 2014-12-21 DIAGNOSIS — K760 Fatty (change of) liver, not elsewhere classified: Secondary | ICD-10-CM

## 2014-12-21 DIAGNOSIS — Z90411 Acquired partial absence of pancreas: Secondary | ICD-10-CM

## 2014-12-21 DIAGNOSIS — K838 Other specified diseases of biliary tract: Secondary | ICD-10-CM | POA: Diagnosis not present

## 2014-12-21 DIAGNOSIS — Z9081 Acquired absence of spleen: Secondary | ICD-10-CM | POA: Diagnosis not present

## 2014-12-21 DIAGNOSIS — R748 Abnormal levels of other serum enzymes: Secondary | ICD-10-CM

## 2014-12-30 ENCOUNTER — Telehealth: Payer: Self-pay | Admitting: *Deleted

## 2014-12-30 DIAGNOSIS — R1013 Epigastric pain: Secondary | ICD-10-CM

## 2014-12-30 DIAGNOSIS — R748 Abnormal levels of other serum enzymes: Secondary | ICD-10-CM

## 2014-12-30 NOTE — Telephone Encounter (Signed)
Lipase ordered for recheck and faxed to [facility].

## 2014-12-31 LAB — LIPASE: LIPASE: 178 U/L — AB (ref 0–75)

## 2015-01-14 ENCOUNTER — Other Ambulatory Visit: Payer: Self-pay | Admitting: Family Medicine

## 2015-01-14 DIAGNOSIS — R748 Abnormal levels of other serum enzymes: Secondary | ICD-10-CM

## 2015-01-21 ENCOUNTER — Other Ambulatory Visit: Payer: Self-pay | Admitting: Family Medicine

## 2015-01-28 ENCOUNTER — Ambulatory Visit (INDEPENDENT_AMBULATORY_CARE_PROVIDER_SITE_OTHER): Payer: Commercial Managed Care - HMO | Admitting: Family Medicine

## 2015-01-28 ENCOUNTER — Encounter: Payer: Self-pay | Admitting: Family Medicine

## 2015-01-28 VITALS — BP 128/71 | HR 95 | Ht 61.0 in | Wt 156.0 lb

## 2015-01-28 DIAGNOSIS — Z0000 Encounter for general adult medical examination without abnormal findings: Secondary | ICD-10-CM | POA: Diagnosis not present

## 2015-01-28 DIAGNOSIS — E119 Type 2 diabetes mellitus without complications: Secondary | ICD-10-CM | POA: Diagnosis not present

## 2015-01-28 LAB — POCT UA - MICROALBUMIN
CREATININE, POC: 300 mg/dL
MICROALBUMIN (UR) POC: 30 mg/L

## 2015-01-28 NOTE — Patient Instructions (Signed)
Keep up a regular exercise program and make sure you are eating a healthy diet Try to eat 4 servings of dairy a day, or if you are lactose intolerant take a calcium with vitamin D daily.  Your vaccines are up to date.   

## 2015-01-28 NOTE — Progress Notes (Signed)
Subjective:    Hannah Rojas is a 69 y.o. female who presents for Medicare Annual/Subsequent preventive examination.  Preventive Screening-Counseling & Management  Tobacco History  Smoking status  . Never Smoker   Smokeless tobacco  . Not on file     Problems Prior to Visit 1. Mammogram and colonoscopy are up-to-date. Vaccines are up-to-date as well.  Current Problems (verified) Patient Active Problem List   Diagnosis Date Noted  . Obesity (BMI 30-39.9) 11/19/2013  . 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 Neoplasm of pancreas, malignant 07/12/2011  . Diabetes mellitus, type II 05/17/2007  . KIDNEY DISEASE, CHRONIC, STAGE III 05/17/2007  . ABNORMAL SERUM ENZYME LEVEL NEC 05/16/2007  . 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 SCOLIOSIS 06/26/2006    Medications Prior to Visit Current Outpatient Prescriptions on File Prior to Visit  Medication Sig Dispense Refill  . AMBULATORY NON FORMULARY MEDICATION Medication Name: Glucometer Dx 250.00, Diabetes. 1 Units 0  . atorvastatin (LIPITOR) 40 MG tablet Take 1 tablet (40 mg total) by mouth daily. 90 tablet 3  . lisinopril (PRINIVIL,ZESTRIL) 2.5 MG tablet TAKE 1 TABLET DAILY. 90 tablet 0  . PRODIGY NO CODING BLOOD GLUC test strip TEST ONCE DAILY 50 each 11  . SitaGLIPtin-MetFORMIN HCl (JANUMET XR) 50-500 MG TB24 Take 1 tablet by mouth daily. 30 tablet 6  . traMADol (ULTRAM) 50 MG tablet Take 1 tablet (50 mg total) by mouth every 8 (eight) hours as needed. 90 tablet 3  . VITAMIN D, CHOLECALCIFEROL, PO Take by mouth.     No current facility-administered medications on file prior to visit.    Current Medications (verified) Current Outpatient Prescriptions  Medication Sig Dispense Refill  . AMBULATORY NON FORMULARY MEDICATION Medication Name: Glucometer Dx 250.00, Diabetes. 1 Units 0  . atorvastatin (LIPITOR) 40 MG tablet Take 1 tablet (40 mg total) by mouth  daily. 90 tablet 3  . lisinopril (PRINIVIL,ZESTRIL) 2.5 MG tablet TAKE 1 TABLET DAILY. 90 tablet 0  . PRODIGY NO CODING BLOOD GLUC test strip TEST ONCE DAILY 50 each 11  . SitaGLIPtin-MetFORMIN HCl (JANUMET XR) 50-500 MG TB24 Take 1 tablet by mouth daily. 30 tablet 6  . traMADol (ULTRAM) 50 MG tablet Take 1 tablet (50 mg total) by mouth every 8 (eight) hours as needed. 90 tablet 3  . VITAMIN D, CHOLECALCIFEROL, PO Take by mouth.     No current facility-administered medications for this visit.     Allergies (verified) Iodinated diagnostic agents; Naproxen sodium; Pravastatin; Simvastatin; Sulfonamide derivatives; and Trazodone and nefazodone   PAST HISTORY  Family History No family history on file.  Social History History  Substance Use Topics  . Smoking status: Never Smoker   . Smokeless tobacco: Not on file  . Alcohol Use: No     Are there smokers in your home (other than you)? No  Risk Factors Current exercise habits: The patient does not participate in regular exercise at present.  Dietary issues discussed: None   Cardiac risk factors: advanced age (older than 4 for men, 91 for women), diabetes mellitus and obesity (BMI >= 30 kg/m2).  Depression Screen (Note: if answer to either of the following is "Yes", a more complete depression screening is indicated)   Over the past two weeks, have you felt down, depressed or hopeless? No  Over the past two weeks, have you felt little interest or pleasure in doing things? No  Have you lost interest or pleasure  in daily life? No  Do you often feel hopeless? No  Do you cry easily over simple problems? No  Activities of Daily Living In your present state of health, do you have any difficulty performing the following activities?:  Driving? No Managing money?  No Feeding yourself? No Getting from bed to chair? No  Climbing a flight of stairs? No Preparing food and eating?: No Bathing or showering? No Getting dressed: No Getting  to the toilet? No Using the toilet:No Moving around from place to place: No In the past year have you fallen or had a near fall?:No   Are you sexually active?  No  Do you have more than one partner?  No  Hearing Difficulties: No Do you often ask people to speak up or repeat themselves? No Do you experience ringing or noises in your ears? No Do you have difficulty understanding soft or whispered voices? No   Do you feel that you have a problem with memory? No  Do you often misplace items? Yes  Do you feel safe at home?  Yes  Cognitive Testing  Alert? Yes  Normal Appearance?Yes  Oriented to person? Yes  Place? Yes   Time? Yes  Recall of three objects?  Yes  Can perform simple calculations? Yes  Displays appropriate judgment?Yes  Can read the correct time from a watch face?Yes   6 CIT score 10/28 (abnormal)    Advanced Directives have been discussed with the patient? Yes  List the Names of Other Physician/Practitioners you currently use: 1.    Indicate any recent Medical Services you may have received from other than [facility] providers in the past year (date may be approximate).  Immunization History  Administered Date(s) Administered  . H1N1 07/17/2008  . Influenza Split 06/10/2012  . Influenza Whole 08/27/2007, 06/09/2008, 06/18/2009, 07/13/2009, 06/20/2010  . Influenza,inj,Quad PF,36+ Mos 05/12/2013, 05/21/2014  . Pneumococcal Conjugate-13 12/02/2014  . Pneumococcal Polysaccharide-23 08/27/2007, 11/20/2011  . Td 06/19/2005  . Zoster 07/20/2010    Screening Tests Health Maintenance  Topic Date Due  . URINE MICROALBUMIN  11/20/2014  . HEMOGLOBIN A1C  02/19/2015  . INFLUENZA VACCINE  04/19/2015  . FOOT EXAM  05/22/2015  . TETANUS/TDAP  06/20/2015  . OPHTHALMOLOGY EXAM  06/26/2015  . MAMMOGRAM  05/21/2016  . COLONOSCOPY  05/08/2022  . DEXA SCAN  Completed  . ZOSTAVAX  Completed  . Hepatitis C Screening  Completed  . PNA vac Low Risk Adult  Completed    All  answers were reviewed with the patient and necessary referrals were made:  Kc Summerson, MD   01/28/2015   History reviewed: allergies, current medications, past family history, past medical history, past social history, past surgical history and problem list  Review of Systems A comprehensive review of systems was negative.    Objective:     Vision by Snellen chart: Eye exam UTD.  See scanned report.   Body mass index is 29.49 kg/(m^2). BP 128/71 mmHg  Pulse 95  Ht 5\' 1"  (1.549 m)  Wt 156 lb (70.761 kg)  BMI 29.49 kg/m2  SpO2 98%  BP 128/71 mmHg  Pulse 95  Ht 5\' 1"  (1.549 m)  Wt 156 lb (70.761 kg)  BMI 29.49 kg/m2  SpO2 98% General appearance: alert, cooperative and appears stated age Head: Normocephalic, without obvious abnormality, atraumatic Eyes: conj clear, EOMI, PEERLA Ears: normal TM's and external ear canals both ears Nose: Nares normal. Septum midline. Mucosa normal. No drainage or sinus tenderness. Throat: lips, mucosa, and  tongue normal; teeth and gums normal Neck: no adenopathy, no carotid bruit, no JVD, supple, symmetrical, trachea midline and thyroid not enlarged, symmetric, no tenderness/mass/nodules Back: symmetric, no curvature. ROM normal. No CVA tenderness. Lungs: clear to auscultation bilaterally Breasts: not performed Heart: regular rate and rhythm, S1, S2 normal, no murmur, click, rub or gallop Abdomen: soft, non-tender; bowel sounds normal; no masses,  no organomegaly Extremities: extremities normal, atraumatic, no cyanosis or edema Pulses: 2+ and symmetric Skin: Skin color, texture, turgor normal. No rashes or lesions Lymph nodes: Cervical adenopathy: nl and Supraclavicular adenopathy: nl Neurologic: Alert and oriented X 3, normal strength and tone. Normal symmetric reflexes. Normal coordination and gait     Assessment:     Annual Medicare Wellness       Plan:     During the course of the visit the patient was educated and  counseled about appropriate screening and preventive services including:    Advanced directives: has NO advanced directive  - add't info requested. Referral to SW: no   Screen for cognitive impairment was positive. Recommended that she come back for a full Mini-Mental status evaluation. She said she will think about it.  Due for diabetic follow-up in one month. She can schedule her next appointment then. Due for urine microalbumin today.  Diet review for nutrition referral? Yes ____  Not Indicated _x___   Patient Instructions (the written plan) was given to the patient.  Medicare Attestation I have personally reviewed: The patient's medical and social history Their use of alcohol, tobacco or illicit drugs Their current medications and supplements The patient's functional ability including ADLs,fall risks, home safety risks, cognitive, and hearing and visual impairment Diet and physical activities Evidence for depression or mood disorders  The patient's weight, height, BMI, and visual acuity have been recorded in the chart.  I have made referrals, counseling, and provided education to the patient based on review of the above and I have provided the patient with a written personalized care plan for preventive services.     Perl Folmar, MD   01/28/2015

## 2015-01-28 NOTE — Addendum Note (Signed)
Addended by: Jamesetta So on: 01/28/2015 03:47 PM   Modules accepted: Orders

## 2015-03-04 ENCOUNTER — Encounter: Payer: Self-pay | Admitting: Family Medicine

## 2015-03-04 ENCOUNTER — Ambulatory Visit (INDEPENDENT_AMBULATORY_CARE_PROVIDER_SITE_OTHER): Payer: Commercial Managed Care - HMO | Admitting: Family Medicine

## 2015-03-04 VITALS — BP 124/63 | HR 88 | Ht 61.0 in | Wt 164.0 lb

## 2015-03-04 DIAGNOSIS — E119 Type 2 diabetes mellitus without complications: Secondary | ICD-10-CM

## 2015-03-04 DIAGNOSIS — R6881 Early satiety: Secondary | ICD-10-CM

## 2015-03-04 LAB — HEPATIC FUNCTION PANEL
AST: 43 U/L — AB (ref 13–35)
Alkaline Phosphatase: 52 U/L (ref 25–125)

## 2015-03-04 LAB — BASIC METABOLIC PANEL
BUN: 16 mg/dL (ref 4–21)
CREATININE: 9.6 mg/dL — AB (ref 0.5–1.1)
Glucose: 114 mg/dL
Potassium: 4.5 mmol/L (ref 3.4–5.3)
Sodium: 141 mmol/L (ref 137–147)

## 2015-03-04 LAB — POCT GLYCOSYLATED HEMOGLOBIN (HGB A1C): HEMOGLOBIN A1C: 7

## 2015-03-04 LAB — BILIRUBIN, TOTAL: Total Bilirubin: 0.7 mg/dL

## 2015-03-04 LAB — ESTIMATED GFR: GFR, Est Non African American: 62

## 2015-03-04 LAB — LIPASE: Lipase: 64

## 2015-03-04 LAB — CALCIUM: Calcium: 9.6 mg/dL

## 2015-03-04 LAB — CHLORIDE: Chloride: 99 mmol/L

## 2015-03-04 NOTE — Progress Notes (Signed)
   Subjective:    Patient ID: Hannah Rojas, female    DOB: 1945-10-09, 69 y.o.   MRN: [id number]  HPI Diabetes - no hypoglycemic events. No wounds or sores that are not healing well. No increased thirst or urination. Checking glucose at home. Taking medications as prescribed without any side effects.  She is scheduled for a gastric emptying scan On 6/20 to eval for gastroparesis. Lipase is still midly elevated.  She still has abdominal discomfort after she eats and feels very full after eating.    Review of Systems     Objective:   Physical Exam  Constitutional: She is oriented to person, place, and time. She appears well-developed and well-nourished.  HENT:  Head: Normocephalic and atraumatic.  Cardiovascular: Normal rate, regular rhythm and normal heart sounds.   Pulmonary/Chest: Effort normal and breath sounds normal.  Neurological: She is alert and oriented to person, place, and time.  Skin: Skin is warm and dry.  Psychiatric: She has a normal mood and affect. Her behavior is normal.          Assessment & Plan:  DM- A1C is 7.0.  Well controlled.  Much improved.  Great work. Keep up the good job. Will follow back up in 3 months. No adjustments made today. Continue with diet and exercise. She's made a lot of progress in the last 6 months with bringing her A1c down. Due for screening lipid. Lab slip provided.  Bloating and early satiety-scheduled for gastric edema scan next week. Hopefully this will help answer some questions for her.

## 2015-03-09 ENCOUNTER — Telehealth: Payer: Self-pay | Admitting: Family Medicine

## 2015-03-09 NOTE — Telephone Encounter (Signed)
Please call patient: Liver enzymes are still mildly elevated but stable. Kidney function is also mildly elevated but stable. We will into the [facility] system.

## 2015-03-10 NOTE — Telephone Encounter (Signed)
Pt notified of results

## 2015-03-11 ENCOUNTER — Encounter: Payer: Self-pay | Admitting: Family Medicine

## 2015-03-12 LAB — LIPID PANEL
CHOL/HDL RATIO: 3.5 ratio
Cholesterol: 201 mg/dL — ABNORMAL HIGH (ref 0–200)
HDL: 58 mg/dL (ref >=46)
LDL Cholesterol: 108 mg/dL — ABNORMAL HIGH (ref 0–99)
Triglycerides: 174 mg/dL — ABNORMAL HIGH (ref <150)
VLDL: 35 mg/dL (ref 0–40)

## 2015-03-15 ENCOUNTER — Other Ambulatory Visit: Payer: Self-pay | Admitting: *Deleted

## 2015-03-15 MED ORDER — ATORVASTATIN CALCIUM 80 MG PO TABS: 80.0000 mg | ORAL_TABLET | Freq: Every day | ORAL | Status: DC

## 2015-03-16 ENCOUNTER — Other Ambulatory Visit: Payer: Self-pay | Admitting: Family Medicine

## 2015-06-03 ENCOUNTER — Ambulatory Visit (INDEPENDENT_AMBULATORY_CARE_PROVIDER_SITE_OTHER): Payer: Commercial Managed Care - HMO | Admitting: Family Medicine

## 2015-06-03 ENCOUNTER — Encounter: Payer: Self-pay | Admitting: Family Medicine

## 2015-06-03 VITALS — BP 121/73 | HR 76 | Temp 98.2°F | Ht 61.0 in | Wt 166.0 lb

## 2015-06-03 DIAGNOSIS — N183 Chronic kidney disease, stage 3 unspecified: Secondary | ICD-10-CM

## 2015-06-03 DIAGNOSIS — Z23 Encounter for immunization: Secondary | ICD-10-CM | POA: Diagnosis not present

## 2015-06-03 DIAGNOSIS — R600 Localized edema: Secondary | ICD-10-CM | POA: Diagnosis not present

## 2015-06-03 DIAGNOSIS — E119 Type 2 diabetes mellitus without complications: Secondary | ICD-10-CM | POA: Diagnosis not present

## 2015-06-03 LAB — POCT GLYCOSYLATED HEMOGLOBIN (HGB A1C): Hemoglobin A1C: 8.5

## 2015-06-03 MED ORDER — SITAGLIP PHOS-METFORMIN HCL ER 50-1000 MG PO TB24: 1.0000 | ORAL_TABLET | Freq: Every day | ORAL | Status: DC

## 2015-06-03 NOTE — Progress Notes (Signed)
   Subjective:    Patient ID: Hannah Rojas, female    DOB: 1946-07-15, 69 y.o.   MRN: [id number]  HPI Diabetes - no hypoglycemic events. No wounds or sores that are not healing well. No increased thirst or urination. Checking glucose at home. Taking medications as prescribed without any side effects. Her weight is up 2 lbs. She is very active ato work but is not exercising regularly.   CKD 3 - gets trace swelling around her left ankle.   Though this is not new. It gets worse at the end of the day in the session when she's been up on her feet. She does not wear compression stockings.   Review of Systems     Objective:   Physical Exam  Constitutional: She is oriented to person, place, and time. She appears well-developed and well-nourished.  HENT:  Head: Normocephalic and atraumatic.  Cardiovascular: Normal rate, regular rhythm and normal heart sounds.   Pulmonary/Chest: Effort normal and breath sounds normal.  Neurological: She is alert and oriented to person, place, and time.  Skin: Skin is warm and dry.  Psychiatric: She has a normal mood and affect. Her behavior is normal.          Assessment & Plan:  DM- uncontrolled.  A1C is 8.5. Last a1c was 7.0.  She is on a statin and ACE inhibitor. She really denies any significant changes in diet or activity level. She's not sure why it has jumped so much. She is also taking her medication consistently and that has not changed as well. We discussed going up on the Janumet and increasing the metformin component. If she has any GI problems and please let me know. She said she will call me in 3 weeks and let us know what her blood sugars are doing. If they are not consistently under 130 then we are going to add either a flozins vs GLP-1 agoanist. WE discussed how they work. Asked her to think  About which one she would prefer if her glucose does not come down over the next few weeks.  CKD 3-  She did have a BMP done 3 months ago. The  creatinine that was on the lab was incorrct. I suspect it was supposed to be 0.979. Will hold off and repeat blood work at follow-up in 3 months.   lower extremity edema-discussed the compression stockings to be very helpful. Discussed where to find them. I think she'll be a good candidate for below the knee.  Given flu shot today.

## 2015-06-03 NOTE — Patient Instructions (Signed)
Call me in 3 weeks and let us know what your sugars are doing.

## 2015-06-16 ENCOUNTER — Other Ambulatory Visit: Payer: Self-pay | Admitting: Family Medicine

## 2015-08-03 ENCOUNTER — Other Ambulatory Visit: Payer: Self-pay | Admitting: Family Medicine

## 2015-09-02 ENCOUNTER — Encounter: Payer: Self-pay | Admitting: Family Medicine

## 2015-09-02 ENCOUNTER — Ambulatory Visit (INDEPENDENT_AMBULATORY_CARE_PROVIDER_SITE_OTHER): Payer: Commercial Managed Care - HMO | Admitting: Family Medicine

## 2015-09-02 VITALS — BP 120/63 | HR 85 | Ht 61.0 in | Wt 161.0 lb

## 2015-09-02 DIAGNOSIS — E119 Type 2 diabetes mellitus without complications: Secondary | ICD-10-CM

## 2015-09-02 DIAGNOSIS — N183 Chronic kidney disease, stage 3 unspecified: Secondary | ICD-10-CM

## 2015-09-02 LAB — POCT GLYCOSYLATED HEMOGLOBIN (HGB A1C): HEMOGLOBIN A1C: 7.5

## 2015-09-02 MED ORDER — EMPAGLIFLOZIN-LINAGLIPTIN 10-5 MG PO TABS: 1.0000 | ORAL_TABLET | ORAL | Status: DC

## 2015-09-02 MED ORDER — AMBULATORY NON FORMULARY MEDICATION: Status: DC

## 2015-09-02 MED ORDER — METFORMIN HCL 1000 MG PO TABS
1000.0000 mg | ORAL_TABLET | Freq: Two times a day (BID) | ORAL | Status: DC
Start: 2015-09-02 — End: 2016-01-06

## 2015-09-02 NOTE — Progress Notes (Signed)
   Subjective:    Patient ID: Hannah Rojas, female    DOB: [birth year], 69 y.o.   MRN: [id number]  HPI Diabetes - no hypoglycemic events. No wounds or sores that are not healing well. No increased thirst or urination. Checking glucose at home. Taking medications as prescribed without any side effects.  She is doing well and she has lost 5 lbs.     CKD 3-no recent changes.  Review of Systems     Objective:   Physical Exam  Constitutional: She is oriented to person, place, and time. She appears well-developed and well-nourished.  HENT:  Head: Normocephalic and atraumatic.  Cardiovascular: Normal rate, regular rhythm and normal heart sounds.   Pulmonary/Chest: Effort normal and breath sounds normal.  Neurological: She is alert and oriented to person, place, and time.  Skin: Skin is warm and dry.  Psychiatric: She has a normal mood and affect. Her behavior is normal.          Assessment & Plan:  DM-   Much improved. HemoglobinA1c is dow to 7.5 which is better than the previous of 8.5. We discussed continuing to work on diet and exercise and adding a "flozin".   We'll discontinue Janumet and switch to click amily and metfomin separately. I think this will be most cot effective for her as it looks like the click same knee is to 3 whichiswhat se is curretly paying for her Janumet. She is on a stati and an ACE  Inhibitor.  CKD 3-due to recheck kidney function.

## 2015-10-20 ENCOUNTER — Encounter: Payer: Self-pay | Admitting: Family Medicine

## 2015-12-01 ENCOUNTER — Encounter: Payer: Self-pay | Admitting: Family Medicine

## 2015-12-01 ENCOUNTER — Ambulatory Visit (INDEPENDENT_AMBULATORY_CARE_PROVIDER_SITE_OTHER): Payer: Commercial Managed Care - HMO | Admitting: Family Medicine

## 2015-12-01 VITALS — BP 105/62 | HR 87 | Wt 159.0 lb

## 2015-12-01 DIAGNOSIS — N183 Chronic kidney disease, stage 3 unspecified: Secondary | ICD-10-CM

## 2015-12-01 DIAGNOSIS — E119 Type 2 diabetes mellitus without complications: Secondary | ICD-10-CM

## 2015-12-01 LAB — POCT GLYCOSYLATED HEMOGLOBIN (HGB A1C): Hemoglobin A1C: 6.8

## 2015-12-01 NOTE — Patient Instructions (Signed)
Please remember to get you eye exam.

## 2015-12-01 NOTE — Progress Notes (Signed)
   Subjective:    Patient ID: MAICY CHEADLE, female    DOB: January 05, 1946, 70 y.o.   MRN: [id number]  HPI Diabetes - no hypoglycemic events. No wounds or sores that are not healing well. No increased thirst or urination. Checking glucose at home. Taking medications as prescribed without any side effects.Does have some concerns with affording the Glyxambi  CKD 3 - no change in urination.  Review of Systems     Objective:   Physical Exam  Constitutional: She is oriented to person, place, and time. She appears well-developed and well-nourished.  HENT:  Head: Normocephalic and atraumatic.  Cardiovascular: Normal rate, regular rhythm and normal heart sounds.   Pulmonary/Chest: Effort normal and breath sounds normal.  Neurological: She is alert and oriented to person, place, and time.  Skin: Skin is warm and dry.  Psychiatric: She has a normal mood and affect. Her behavior is normal.        Assessment & Plan:  DM - well controlled.  On ACE, statin.  Reminded to get eye exam. A1C of 6.8.  Reminded her to get eye exam. Did encourage her to check on mail order as an option. It may be much deeper and in fact her generic prescriptions might even be free if she uses mail order. Encouraged her to give them a call and see what might be most cost effective. If she feels like she still cannot afford the medication than please let me know and we can try to come up with an alternative but she is doing well on her current regimen.  CKD 3-due to recheck kidney function. Lab slip provided today.

## 2015-12-02 LAB — BASIC METABOLIC PANEL WITH GFR
BUN: 24 mg/dL (ref 7–25)
CHLORIDE: 101 mmol/L (ref 98–110)
CO2: 26 mmol/L (ref 20–31)
Calcium: 10 mg/dL (ref 8.6–10.4)
Creat: 1.08 mg/dL — ABNORMAL HIGH (ref 0.50–0.99)
GFR, Est African American: 61 mL/min (ref >=60)
GFR, Est Non African American: 53 mL/min — ABNORMAL LOW (ref >=60)
GLUCOSE: 109 mg/dL — AB (ref 65–99)
POTASSIUM: 4.7 mmol/L (ref 3.5–5.3)
Sodium: 139 mmol/L (ref 135–146)

## 2015-12-10 ENCOUNTER — Other Ambulatory Visit: Payer: Self-pay | Admitting: Family Medicine

## 2015-12-10 DIAGNOSIS — Z1231 Encounter for screening mammogram for malignant neoplasm of breast: Secondary | ICD-10-CM

## 2015-12-13 ENCOUNTER — Other Ambulatory Visit: Payer: Self-pay | Admitting: Family Medicine

## 2015-12-15 ENCOUNTER — Ambulatory Visit (INDEPENDENT_AMBULATORY_CARE_PROVIDER_SITE_OTHER): Payer: Commercial Managed Care - HMO

## 2015-12-15 DIAGNOSIS — Z1231 Encounter for screening mammogram for malignant neoplasm of breast: Secondary | ICD-10-CM | POA: Diagnosis not present

## 2015-12-29 ENCOUNTER — Other Ambulatory Visit: Payer: Self-pay | Admitting: Family Medicine

## 2016-01-06 ENCOUNTER — Other Ambulatory Visit: Payer: Self-pay | Admitting: *Deleted

## 2016-01-06 DIAGNOSIS — E119 Type 2 diabetes mellitus without complications: Secondary | ICD-10-CM

## 2016-01-06 MED ORDER — LISINOPRIL 2.5 MG PO TABS: 2.5000 mg | ORAL_TABLET | Freq: Every day | ORAL | Status: DC

## 2016-01-06 MED ORDER — ACCU-CHEK AVIVA PLUS VI STRP: ORAL_STRIP | Status: DC

## 2016-01-06 MED ORDER — ATORVASTATIN CALCIUM 80 MG PO TABS: ORAL_TABLET | ORAL | Status: DC

## 2016-01-06 MED ORDER — METFORMIN HCL 1000 MG PO TABS: 1000.0000 mg | ORAL_TABLET | Freq: Two times a day (BID) | ORAL | Status: DC

## 2016-01-06 MED ORDER — EMPAGLIFLOZIN-LINAGLIPTIN 10-5 MG PO TABS: 1.0000 | ORAL_TABLET | ORAL | Status: DC

## 2016-01-11 ENCOUNTER — Other Ambulatory Visit: Payer: Self-pay | Admitting: *Deleted

## 2016-01-11 DIAGNOSIS — E119 Type 2 diabetes mellitus without complications: Secondary | ICD-10-CM

## 2016-01-11 MED ORDER — ACCU-CHEK AVIVA PLUS VI STRP: ORAL_STRIP | Status: DC

## 2016-02-04 ENCOUNTER — Inpatient Hospital Stay: Payer: Commercial Managed Care - HMO | Admitting: Family Medicine

## 2016-02-07 ENCOUNTER — Telehealth: Payer: Self-pay | Admitting: *Deleted

## 2016-02-08 NOTE — Telephone Encounter (Signed)
Error

## 2016-02-11 ENCOUNTER — Ambulatory Visit (INDEPENDENT_AMBULATORY_CARE_PROVIDER_SITE_OTHER): Payer: Commercial Managed Care - HMO | Admitting: Family Medicine

## 2016-02-11 ENCOUNTER — Encounter: Payer: Self-pay | Admitting: Family Medicine

## 2016-02-11 VITALS — BP 101/61 | HR 99 | Wt 152.0 lb

## 2016-02-11 DIAGNOSIS — R1013 Epigastric pain: Secondary | ICD-10-CM | POA: Diagnosis not present

## 2016-02-11 LAB — CBC WITH DIFFERENTIAL/PLATELET
Basophils Absolute: 118 cells/uL (ref 0–200)
Basophils Relative: 1 %
EOS PCT: 18 %
Eosinophils Absolute: 2124 cells/uL — ABNORMAL HIGH (ref 15–500)
HCT: 40.3 % (ref 35.0–45.0)
HEMOGLOBIN: 13.6 g/dL (ref 11.7–15.5)
LYMPHS ABS: 4838 {cells}/uL — AB (ref 850–3900)
LYMPHS PCT: 41 %
MCH: 30.5 pg (ref 27.0–33.0)
MCHC: 33.7 g/dL (ref 32.0–36.0)
MCV: 90.4 fL (ref 80.0–100.0)
MONOS PCT: 7 %
MPV: 11.5 fL (ref 7.5–12.5)
Monocytes Absolute: 826 cells/uL (ref 200–950)
NEUTROS PCT: 33 %
Neutro Abs: 3894 cells/uL (ref 1500–7800)
PLATELETS: 353 10*3/uL (ref 140–400)
RBC: 4.46 MIL/uL (ref 3.80–5.10)
RDW: 14.2 % (ref 11.0–15.0)
WBC: 11.8 10*3/uL — AB (ref 3.8–10.8)

## 2016-02-11 NOTE — Progress Notes (Signed)
Subjective:    CC: Hospital F/U   HPI: Patient is here today for hospital follow-up. She actually went to the emergency room Sunday night because of abdominal pain and nausea and vomiting. Remember any specific triggers or eating anything bad. Her white blood cell count was 27,000 and they actually did an abdominal CT scan did not show any acute findings. It did show some aortic atherosclerosis without aneurysm and a small anterior abdominal wall hernia. By the next night she continued to feel bad and so she went back to the emergency room. At that point her white blood cell count was trending down. It was down to 18.6 thousand.  Past medical history, Surgical history, Family history not pertinant except as noted below, Social history, Allergies, and medications have been entered into the medical record, reviewed, and corrections made.   Review of Systems: No fevers, chills, night sweats, weight loss, chest pain, or shortness of breath.   Objective:    General: Well Developed, well nourished, and in no acute distress.  Neuro: Alert and oriented x3, extra-ocular muscles intact, sensation grossly intact.  HEENT: Normocephalic, atraumatic  Skin: Warm and dry, no rashes. Cardiac: Regular rate and rhythm, no murmurs rubs or gallops, no lower extremity edema.  Respiratory: Clear to auscultation bilaterally. Not using accessory muscles, speaking in full sentences. Abdomen: Soft, mildly tender diffusely, no OGM, no distention. Normal BS.     Impression and Recommendations:   Acute abdominal pain with vomiting-she is feeling much better overall. Still feeling a little bit weak. Will give work note to return on Tuesday after the holiday. Continue to work on hydration and slowly advancing diet back to normal. We'll recheck her white blood cell count today just to make sure that it is trending down. She's had a prior history of multiple abdominal surgeries. If symptoms persist or she does not go  completely better after the weekend and consider getting her back in with GI.  Work note given.

## 2016-03-02 ENCOUNTER — Ambulatory Visit: Payer: Commercial Managed Care - HMO | Admitting: Family Medicine

## 2016-03-13 ENCOUNTER — Encounter: Payer: Self-pay | Admitting: Family Medicine

## 2016-03-13 ENCOUNTER — Ambulatory Visit (INDEPENDENT_AMBULATORY_CARE_PROVIDER_SITE_OTHER): Payer: Commercial Managed Care - HMO | Admitting: Family Medicine

## 2016-03-13 VITALS — BP 122/69 | HR 93 | Wt 155.0 lb

## 2016-03-13 DIAGNOSIS — E1169 Type 2 diabetes mellitus with other specified complication: Secondary | ICD-10-CM | POA: Insufficient documentation

## 2016-03-13 DIAGNOSIS — E119 Type 2 diabetes mellitus without complications: Secondary | ICD-10-CM

## 2016-03-13 DIAGNOSIS — Z13220 Encounter for screening for lipoid disorders: Secondary | ICD-10-CM | POA: Diagnosis not present

## 2016-03-13 DIAGNOSIS — E785 Hyperlipidemia, unspecified: Secondary | ICD-10-CM

## 2016-03-13 LAB — POCT GLYCOSYLATED HEMOGLOBIN (HGB A1C): Hemoglobin A1C: 5.8

## 2016-03-13 NOTE — Progress Notes (Signed)
Subjective:    CC:   HPI:  Diabetes - no hypoglycemic events. No wounds or sores that are not healing well. No increased thirst or urination. Checking glucose at home. Taking medications as prescribed without any side effects.  HyperLipidemia-currently on atorvastatin 80 mg daily.she actually has not been taking her medication for the last few weeks when she got sick with vomiting. She says she is just now starting to feel better.    Past medical history, Surgical history, Family history not pertinant except as noted below, Social history, Allergies, and medications have been entered into the medical record, reviewed, and corrections made.   Review of Systems: No fevers, chills, night sweats, weight loss, chest pain, or shortness of breath.   Objective:    General: Well Developed, well nourished, and in no acute distress.  Neuro: Alert and oriented x3, extra-ocular muscles intact, sensation grossly intact.  HEENT: Normocephalic, atraumatic  Skin: Warm and dry, no rashes. Cardiac: Regular rate and rhythm, no murmurs rubs or gallops, no lower extremity edema.  Respiratory: Clear to auscultation bilaterally. Not using accessory muscles, speaking in full sentences.   Impression and Recommendations:   Diabetes-well controlled. Hemoglobin A1c 5.8 today which is down from previous of 6.8 which is absolutely fantastic. She's currently on Glyxambi. On ACEi and statin.  eminded patient to get up-to-date eye exam.she is actually scheduled in August for [facility] eye surgeons.  Hyperlipidemia-due to recheck lipids.  Last LDL was 108.encouraged her to restart her statin and to take it at bedtime. She actually been taking in the morning previously. Recommend recheck lipids in a couple weeks. Lab slip provided today.

## 2016-03-17 LAB — COMPLETE METABOLIC PANEL WITH GFR
ALBUMIN: 4.3 g/dL (ref 3.6–5.1)
ALK PHOS: 123 U/L (ref 33–130)
ALT: 16 U/L (ref 6–29)
AST: 18 U/L (ref 10–35)
BUN: 17 mg/dL (ref 7–25)
CO2: 26 mmol/L (ref 20–31)
Calcium: 9.9 mg/dL (ref 8.6–10.4)
Chloride: 103 mmol/L (ref 98–110)
Creat: 1.08 mg/dL — ABNORMAL HIGH (ref 0.50–0.99)
GFR, Est African American: 61 mL/min (ref >=60)
GFR, Est Non African American: 53 mL/min — ABNORMAL LOW (ref >=60)
GLUCOSE: 112 mg/dL — AB (ref 65–99)
POTASSIUM: 4.5 mmol/L (ref 3.5–5.3)
SODIUM: 138 mmol/L (ref 135–146)
Total Bilirubin: 0.6 mg/dL (ref 0.2–1.2)
Total Protein: 7.2 g/dL (ref 6.1–8.1)

## 2016-03-17 LAB — LIPID PANEL
CHOL/HDL RATIO: 2.2 ratio (ref <=5.0)
Cholesterol: 128 mg/dL (ref 125–200)
HDL: 57 mg/dL (ref >=46)
LDL CALC: 52 mg/dL (ref <130)
Triglycerides: 93 mg/dL (ref <150)
VLDL: 19 mg/dL (ref <30)

## 2016-04-24 LAB — HM DIABETES EYE EXAM

## 2016-04-25 ENCOUNTER — Other Ambulatory Visit: Payer: Self-pay

## 2016-04-25 DIAGNOSIS — E119 Type 2 diabetes mellitus without complications: Secondary | ICD-10-CM

## 2016-04-25 DIAGNOSIS — Z0100 Encounter for examination of eyes and vision without abnormal findings: Principal | ICD-10-CM

## 2016-05-11 ENCOUNTER — Other Ambulatory Visit: Payer: Self-pay

## 2016-05-11 DIAGNOSIS — E119 Type 2 diabetes mellitus without complications: Secondary | ICD-10-CM

## 2016-05-11 MED ORDER — EMPAGLIFLOZIN-LINAGLIPTIN 10-5 MG PO TABS: 1.0000 | ORAL_TABLET | ORAL | 2 refills | Status: DC

## 2016-06-15 ENCOUNTER — Encounter: Payer: Self-pay | Admitting: Family Medicine

## 2016-06-15 ENCOUNTER — Ambulatory Visit (INDEPENDENT_AMBULATORY_CARE_PROVIDER_SITE_OTHER): Payer: Commercial Managed Care - HMO | Admitting: Family Medicine

## 2016-06-15 VITALS — BP 115/61 | HR 77 | Wt 153.0 lb

## 2016-06-15 DIAGNOSIS — M25472 Effusion, left ankle: Secondary | ICD-10-CM

## 2016-06-15 DIAGNOSIS — Z23 Encounter for immunization: Secondary | ICD-10-CM

## 2016-06-15 DIAGNOSIS — E785 Hyperlipidemia, unspecified: Secondary | ICD-10-CM | POA: Diagnosis not present

## 2016-06-15 DIAGNOSIS — E119 Type 2 diabetes mellitus without complications: Secondary | ICD-10-CM | POA: Diagnosis not present

## 2016-06-15 LAB — POCT GLYCOSYLATED HEMOGLOBIN (HGB A1C): HEMOGLOBIN A1C: 6.3

## 2016-06-15 NOTE — Progress Notes (Signed)
Subjective:    CC: DM  HPI: Diabetes - no hypoglycemic events. No wounds or sores that are not healing well. No increased thirst or urination. Checking glucose at home. Taking medications as prescribed without any side effects.  Hyperlipidemia-she says since her last cholesterol looks great she is actually decreased her Lipitor to one pill once a week.  She also complains of some left ankle swelling that been bothering her for several months. She says it's worse by the end of the day. It really isn't painful or bothersome at all she's just noticed it and others have even commented. She has really noticed any exacerbating factors. No new medications or trauma or injury recently.  Past medical history, Surgical history, Family history not pertinant except as noted below, Social history, Allergies, and medications have been entered into the medical record, reviewed, and corrections made.   Review of Systems: No fevers, chills, night sweats, weight loss, chest pain, or shortness of breath.   Objective:    General: Well Developed, well nourished, and in no acute distress.  Neuro: Alert and oriented x3, extra-ocular muscles intact, sensation grossly intact.  HEENT: Normocephalic, atraumatic  Skin: Warm and dry, no rashes. Cardiac: Regular rate and rhythm, no murmurs rubs or gallops, no lower extremity edema.  Respiratory: Clear to auscultation bilaterally. Not using accessory muscles, speaking in full sentences. Ext: trace left ankle edema.  Right ankle looks great!  No calf pain or tenderness with squeeze.     Impression and Recommendations:    DM - Well controlled. Hemoglobin A1c is 6.3. This is up a little bit from previous which was 5.8. Encouraged her to just make sure that she is on track with diet and exercise. Continue current regimen. She is Artie on a statin and ACE inhibitor. On for diabetic eye exam report.   Hyperlipidemia-I did explain the taking a statin helps reduce her risk  of heart attack desperate and stroke especially with her diagnosis of diabetes. Encouraged her to take her Lipitor at least 3 days a week. Since her last cholesterol looks great and at the time she that she stopped her medication she can decrease it down to half a tab. When she is due up for renewal of her medication we can change it to a 20 mg Lipitor that she can take nightly.  Left ankle swelling- gave reassurance. Most likely venous stasis. Recommend compression stockings. No sign of DVT.    Plan to get her tetanus through the pharmacy since she has Medicare.

## 2016-06-20 ENCOUNTER — Encounter: Payer: Self-pay | Admitting: Family Medicine

## 2016-09-27 ENCOUNTER — Ambulatory Visit: Payer: Commercial Managed Care - HMO | Admitting: Family Medicine

## 2016-09-28 ENCOUNTER — Ambulatory Visit (INDEPENDENT_AMBULATORY_CARE_PROVIDER_SITE_OTHER): Payer: Medicare Other | Admitting: Family Medicine

## 2016-09-28 ENCOUNTER — Encounter: Payer: Self-pay | Admitting: Family Medicine

## 2016-09-28 VITALS — BP 116/64 | HR 75 | Ht 61.0 in | Wt 146.0 lb

## 2016-09-28 DIAGNOSIS — E119 Type 2 diabetes mellitus without complications: Secondary | ICD-10-CM

## 2016-09-28 DIAGNOSIS — N183 Chronic kidney disease, stage 3 unspecified: Secondary | ICD-10-CM

## 2016-09-28 DIAGNOSIS — E7800 Pure hypercholesterolemia, unspecified: Secondary | ICD-10-CM | POA: Diagnosis not present

## 2016-09-28 LAB — POCT GLYCOSYLATED HEMOGLOBIN (HGB A1C): Hemoglobin A1C: 5.9

## 2016-09-28 LAB — BASIC METABOLIC PANEL WITHOUT GFR
BUN: 18 mg/dL (ref 7–25)
CO2: 27 mmol/L (ref 20–31)
Calcium: 9.9 mg/dL (ref 8.6–10.4)
Chloride: 104 mmol/L (ref 98–110)
Creat: 1.06 mg/dL — ABNORMAL HIGH (ref 0.60–0.93)
GFR, Est African American: 61 mL/min
GFR, Est Non African American: 53 mL/min — ABNORMAL LOW
Glucose, Bld: 107 mg/dL — ABNORMAL HIGH (ref 65–99)
Potassium: 4.5 mmol/L (ref 3.5–5.3)
Sodium: 139 mmol/L (ref 135–146)

## 2016-09-28 MED ORDER — ROSUVASTATIN CALCIUM 20 MG PO TABS: 20.0000 mg | ORAL_TABLET | Freq: Every day | ORAL | 1 refills | Status: DC

## 2016-09-28 NOTE — Progress Notes (Signed)
Subjective:    CC: DM  HPI: Diabetes - no hypoglycemic events. No wounds or sores that are not healing well. No increased thirst or urination. Checking glucose at home. Taking medications as prescribed without any side effects.He is actually lost 7 pounds since I last saw her in September.  CKD 3-no renal symptoms. Next  Hyperlipidemia-she said she got headaches with the atorvastatin. Has seen in she stopped the headaches went away. She can try stopping the pill in half and still had side effects. She is willing to try different statin. She is Artie intolerant to simvastatin and pravastatin.  Past medical history, Surgical history, Family history not pertinant except as noted below, Social history, Allergies, and medications have been entered into the medical record, reviewed, and corrections made.   Review of Systems: No fevers, chills, night sweats, weight loss, chest pain, or shortness of breath.   Objective:    General: Well Developed, well nourished, and in no acute distress.  Neuro: Alert and oriented x3, extra-ocular muscles intact, sensation grossly intact.  HEENT: Normocephalic, atraumatic  Skin: Warm and dry, no rashes. Cardiac: Regular rate and rhythm, no murmurs rubs or gallops, no lower extremity edema.  Respiratory: Clear to auscultation bilaterally. Not using accessory muscles, speaking in full sentences.   Impression and Recommendations:   DM- ON ACE and statin. 11 A1c of 5.9 today. Which is down from previous of 6.3. Fantastic progress.  CKD 3 - recheck BUN/Cr today.  Hyperlipidemia-at atorvastatin to intolerance list. We'll try Crestor 20 mg.

## 2016-10-02 ENCOUNTER — Telehealth: Payer: Self-pay | Admitting: Family Medicine

## 2016-10-02 NOTE — Telephone Encounter (Signed)
Call pt: bone density was 3 years ago and due for repeat. If she is ok with this then we can place order.  Thank you.  Beatrice Lecher, MD

## 2016-10-03 ENCOUNTER — Other Ambulatory Visit: Payer: Self-pay

## 2016-10-03 DIAGNOSIS — M8589 Other specified disorders of bone density and structure, multiple sites: Secondary | ICD-10-CM

## 2016-10-03 DIAGNOSIS — Z8781 Personal history of (healed) traumatic fracture: Secondary | ICD-10-CM

## 2016-10-03 NOTE — Telephone Encounter (Signed)
Left VM advising Pt of recommendation.

## 2016-10-03 NOTE — Telephone Encounter (Signed)
Patient advised of recommendation. She agreed to have a Dexa Scan. Ordered scan. Patient is aware.

## 2016-11-02 ENCOUNTER — Ambulatory Visit: Payer: Medicare Other

## 2016-11-09 ENCOUNTER — Other Ambulatory Visit: Payer: Self-pay | Admitting: Family Medicine

## 2016-12-07 ENCOUNTER — Other Ambulatory Visit: Payer: Self-pay | Admitting: Family Medicine

## 2016-12-19 ENCOUNTER — Other Ambulatory Visit: Payer: Medicare Other

## 2016-12-26 ENCOUNTER — Ambulatory Visit (INDEPENDENT_AMBULATORY_CARE_PROVIDER_SITE_OTHER): Payer: Medicare Other

## 2016-12-26 DIAGNOSIS — Z8781 Personal history of (healed) traumatic fracture: Secondary | ICD-10-CM | POA: Diagnosis not present

## 2016-12-26 DIAGNOSIS — M8589 Other specified disorders of bone density and structure, multiple sites: Secondary | ICD-10-CM

## 2016-12-26 DIAGNOSIS — M8588 Other specified disorders of bone density and structure, other site: Secondary | ICD-10-CM | POA: Diagnosis not present

## 2017-01-12 ENCOUNTER — Other Ambulatory Visit: Payer: Self-pay | Admitting: Family Medicine

## 2017-01-12 DIAGNOSIS — E119 Type 2 diabetes mellitus without complications: Secondary | ICD-10-CM

## 2017-01-25 ENCOUNTER — Encounter: Payer: Self-pay | Admitting: Family Medicine

## 2017-01-25 ENCOUNTER — Ambulatory Visit (INDEPENDENT_AMBULATORY_CARE_PROVIDER_SITE_OTHER): Payer: Medicare Other | Admitting: Family Medicine

## 2017-01-25 VITALS — BP 114/68 | HR 73 | Ht 61.0 in | Wt 142.0 lb

## 2017-01-25 DIAGNOSIS — E119 Type 2 diabetes mellitus without complications: Secondary | ICD-10-CM | POA: Diagnosis not present

## 2017-01-25 DIAGNOSIS — N183 Chronic kidney disease, stage 3 unspecified: Secondary | ICD-10-CM

## 2017-01-25 DIAGNOSIS — E785 Hyperlipidemia, unspecified: Secondary | ICD-10-CM | POA: Diagnosis not present

## 2017-01-25 DIAGNOSIS — E1169 Type 2 diabetes mellitus with other specified complication: Secondary | ICD-10-CM | POA: Diagnosis not present

## 2017-01-25 DIAGNOSIS — Z23 Encounter for immunization: Secondary | ICD-10-CM

## 2017-01-25 LAB — POCT GLYCOSYLATED HEMOGLOBIN (HGB A1C): HEMOGLOBIN A1C: 6.2

## 2017-01-25 NOTE — Addendum Note (Signed)
Addended by: Teddy Spike on: 01/25/2017 11:55 AM   Modules accepted: Orders

## 2017-01-25 NOTE — Progress Notes (Signed)
Subjective:    CC: DM, kidneys check   HPI:  Diabetes - no hypoglycemic events. No wounds or sores that are not healing well. No increased thirst or urination. Checking glucose at home. Taking medications as prescribed without any side effects. She has been out of her glyxambi for about 2 weeks because of the cost.   Hyperlipidemia - tolerating statin well with no side effects.    CKD- no change in urination.     Past medical history, Surgical history, Family history not pertinant except as noted below, Social history, Allergies, and medications have been entered into the medical record, reviewed, and corrections made.   Review of Systems: No fevers, chills, night sweats, weight loss, chest pain, or shortness of breath.   Objective:    General: Well Developed, well nourished, and in no acute distress.  Neuro: Alert and oriented x3, extra-ocular muscles intact, sensation grossly intact.  HEENT: Normocephalic, atraumatic  Skin: Warm and dry, no rashes. Cardiac: Regular rate and rhythm, no murmurs rubs or gallops, no lower extremity edema.  Respiratory: Clear to auscultation bilaterally. Not using accessory muscles, speaking in full sentences.   Impression and Recommendations:    DM- Well controlled. Continue current regimen. Follow up in  4 months. A1C up to 6.2, up a little from previous. Complete urine micro today as well.    Hyperlipidemia - tolerating statin. Due for lipids next month.   Difficulty affording medication - discussed looking into mail order to see if cheaper. If not then we can look at changing her medication.   CKD 3 - due to recheck renal function.    Tdap given today.

## 2017-02-02 ENCOUNTER — Other Ambulatory Visit: Payer: Self-pay | Admitting: Family Medicine

## 2017-02-07 DIAGNOSIS — E119 Type 2 diabetes mellitus without complications: Secondary | ICD-10-CM | POA: Diagnosis not present

## 2017-02-07 DIAGNOSIS — E785 Hyperlipidemia, unspecified: Secondary | ICD-10-CM | POA: Diagnosis not present

## 2017-02-07 DIAGNOSIS — E1169 Type 2 diabetes mellitus with other specified complication: Secondary | ICD-10-CM | POA: Diagnosis not present

## 2017-02-07 LAB — COMPLETE METABOLIC PANEL WITH GFR
ALBUMIN: 4.3 g/dL (ref 3.6–5.1)
ALT: 11 U/L (ref 6–29)
AST: 16 U/L (ref 10–35)
Alkaline Phosphatase: 63 U/L (ref 33–130)
BILIRUBIN TOTAL: 0.6 mg/dL (ref 0.2–1.2)
BUN: 18 mg/dL (ref 7–25)
CALCIUM: 10.2 mg/dL (ref 8.6–10.4)
CO2: 28 mmol/L (ref 20–31)
Chloride: 103 mmol/L (ref 98–110)
Creat: 1.11 mg/dL — ABNORMAL HIGH (ref 0.60–0.93)
GFR, EST AFRICAN AMERICAN: 58 mL/min — AB (ref >=60)
GFR, Est Non African American: 50 mL/min — ABNORMAL LOW (ref >=60)
Glucose, Bld: 101 mg/dL — ABNORMAL HIGH (ref 65–99)
Potassium: 4.9 mmol/L (ref 3.5–5.3)
Sodium: 140 mmol/L (ref 135–146)
TOTAL PROTEIN: 7.3 g/dL (ref 6.1–8.1)

## 2017-02-07 LAB — LIPID PANEL W/REFLEX DIRECT LDL
Cholesterol: 108 mg/dL (ref <200)
HDL: 65 mg/dL (ref >50)
LDL-Cholesterol: 22 mg/dL
Non-HDL Cholesterol (Calc): 43 mg/dL (ref <130)
Total CHOL/HDL Ratio: 1.7 ratio (ref <5.0)
Triglycerides: 127 mg/dL (ref <150)

## 2017-03-06 ENCOUNTER — Other Ambulatory Visit: Payer: Self-pay | Admitting: Family Medicine

## 2017-04-20 ENCOUNTER — Other Ambulatory Visit: Payer: Self-pay | Admitting: Family Medicine

## 2017-04-20 DIAGNOSIS — E119 Type 2 diabetes mellitus without complications: Secondary | ICD-10-CM

## 2017-05-18 ENCOUNTER — Other Ambulatory Visit: Payer: Self-pay | Admitting: Family Medicine

## 2017-05-30 ENCOUNTER — Ambulatory Visit: Payer: Medicare Other | Admitting: Family Medicine

## 2017-06-04 ENCOUNTER — Other Ambulatory Visit: Payer: Self-pay | Admitting: Family Medicine

## 2017-06-12 ENCOUNTER — Encounter: Payer: Self-pay | Admitting: Family Medicine

## 2017-06-12 ENCOUNTER — Telehealth: Payer: Self-pay | Admitting: Family Medicine

## 2017-06-12 ENCOUNTER — Ambulatory Visit (INDEPENDENT_AMBULATORY_CARE_PROVIDER_SITE_OTHER): Payer: Medicare Other | Admitting: Family Medicine

## 2017-06-12 VITALS — BP 106/73 | Ht 61.0 in | Wt 144.0 lb

## 2017-06-12 DIAGNOSIS — N183 Chronic kidney disease, stage 3 unspecified: Secondary | ICD-10-CM

## 2017-06-12 DIAGNOSIS — E119 Type 2 diabetes mellitus without complications: Secondary | ICD-10-CM | POA: Diagnosis not present

## 2017-06-12 DIAGNOSIS — Z23 Encounter for immunization: Secondary | ICD-10-CM

## 2017-06-12 LAB — POCT GLYCOSYLATED HEMOGLOBIN (HGB A1C): Hemoglobin A1C: 6.2

## 2017-06-12 MED ORDER — ROSUVASTATIN CALCIUM 20 MG PO TABS: 20.0000 mg | ORAL_TABLET | Freq: Every day | ORAL | 1 refills | Status: DC

## 2017-06-12 MED ORDER — ROSUVASTATIN CALCIUM 20 MG PO TABS
20.0000 mg | ORAL_TABLET | Freq: Every day | ORAL | 1 refills | Status: DC
Start: 2017-06-12 — End: 2017-06-13

## 2017-06-12 NOTE — Addendum Note (Signed)
Addended by: Narda Rutherford on: 06/12/2017 02:34 PM   Modules accepted: Orders

## 2017-06-12 NOTE — Telephone Encounter (Signed)
Left VM for Vinnie Level [phone]), the drug rep for glyxambi to see if we can get some samples. Awaiting response.

## 2017-06-12 NOTE — Addendum Note (Signed)
Addended by: Narda Rutherford on: 06/12/2017 03:38 PM   Modules accepted: Orders

## 2017-06-12 NOTE — Progress Notes (Signed)
Subjective:    CC: F/U DM, kidneys  HPI:  Diabetes - no hypoglycemic events. No wounds or sores that are not healing well. No increased thirst or urination. Checking glucose at home. Taking medications as prescribed without any side effects.she is concerend about the cost of the Glyxambi.   CKD 3 - Stable.  Follow every 6 months.   Past medical history, Surgical history, Family history not pertinant except as noted below, Social history, Allergies, and medications have been entered into the medical record, reviewed, and corrections made.   Review of Systems: No fevers, chills, night sweats, weight loss, chest pain, or shortness of breath.   Objective:    General: Well Developed, well nourished, and in no acute distress.  Neuro: Alert and oriented x3, extra-ocular muscles intact, sensation grossly intact.  HEENT: Normocephalic, atraumatic  Skin: Warm and dry, no rashes. Cardiac: Regular rate and rhythm, no murmurs rubs or gallops, no lower extremity edema.  Respiratory: Clear to auscultation bilaterally. Not using accessory muscles, speaking in full sentences.   Impression and Recommendations:    DM- Well controlled. Continue current regimen. Follow up in  4 months. Encouraged to scheule eye exam. Flu vaccine given today. We will try to get her samples and she will need to see what what her insurance will cover in regards to diabetic drugs to significant find something that the lower tier.  In some ways I hate to change it because her A1c looks absolutely fantastic.  CKD 3  - Stable.  Follow every 6 months.

## 2017-06-12 NOTE — Addendum Note (Signed)
Addended by: Narda Rutherford on: 06/12/2017 01:25 PM   Modules accepted: Orders

## 2017-06-12 NOTE — Patient Instructions (Signed)
You are due for your eye exam.  

## 2017-06-13 MED ORDER — ROSUVASTATIN CALCIUM 20 MG PO TABS: 20.0000 mg | ORAL_TABLET | Freq: Every day | ORAL | 1 refills | Status: DC

## 2017-06-13 NOTE — Addendum Note (Signed)
Addended by: Narda Rutherford on: 06/13/2017 07:40 AM   Modules accepted: Orders

## 2017-06-20 ENCOUNTER — Other Ambulatory Visit: Payer: Self-pay | Admitting: Family Medicine

## 2017-06-20 DIAGNOSIS — E119 Type 2 diabetes mellitus without complications: Secondary | ICD-10-CM

## 2017-07-04 ENCOUNTER — Other Ambulatory Visit: Payer: Self-pay | Admitting: Family Medicine

## 2017-07-04 ENCOUNTER — Other Ambulatory Visit: Payer: Self-pay | Admitting: *Deleted

## 2017-07-04 MED ORDER — LISINOPRIL 2.5 MG PO TABS
2.5000 mg | ORAL_TABLET | Freq: Every day | ORAL | 2 refills | Status: DC
Start: 2017-07-04 — End: 2017-07-04

## 2017-07-23 ENCOUNTER — Other Ambulatory Visit: Payer: Self-pay | Admitting: Family Medicine

## 2017-08-25 ENCOUNTER — Other Ambulatory Visit: Payer: Self-pay | Admitting: Family Medicine

## 2017-08-25 DIAGNOSIS — E119 Type 2 diabetes mellitus without complications: Secondary | ICD-10-CM

## 2017-10-04 ENCOUNTER — Other Ambulatory Visit: Payer: Self-pay | Admitting: Family Medicine

## 2017-10-04 DIAGNOSIS — E119 Type 2 diabetes mellitus without complications: Secondary | ICD-10-CM

## 2017-10-16 ENCOUNTER — Ambulatory Visit (INDEPENDENT_AMBULATORY_CARE_PROVIDER_SITE_OTHER): Payer: Medicare Other | Admitting: Family Medicine

## 2017-10-16 ENCOUNTER — Encounter: Payer: Self-pay | Admitting: Family Medicine

## 2017-10-16 VITALS — BP 111/50 | HR 74 | Ht 61.0 in | Wt 140.0 lb

## 2017-10-16 DIAGNOSIS — E119 Type 2 diabetes mellitus without complications: Secondary | ICD-10-CM | POA: Diagnosis not present

## 2017-10-16 DIAGNOSIS — N183 Chronic kidney disease, stage 3 unspecified: Secondary | ICD-10-CM

## 2017-10-16 DIAGNOSIS — R42 Dizziness and giddiness: Secondary | ICD-10-CM | POA: Diagnosis not present

## 2017-10-16 LAB — BASIC METABOLIC PANEL WITH GFR
BUN / CREAT RATIO: 22 (calc) (ref 6–22)
BUN: 23 mg/dL (ref 7–25)
CHLORIDE: 103 mmol/L (ref 98–110)
CO2: 28 mmol/L (ref 20–32)
Calcium: 9.8 mg/dL (ref 8.6–10.4)
Creat: 1.04 mg/dL — ABNORMAL HIGH (ref 0.60–0.93)
GFR, Est African American: 63 mL/min/{1.73_m2} (ref >=60)
GFR, Est Non African American: 54 mL/min/{1.73_m2} — ABNORMAL LOW (ref >=60)
GLUCOSE: 100 mg/dL — AB (ref 65–99)
POTASSIUM: 4.3 mmol/L (ref 3.5–5.3)
SODIUM: 138 mmol/L (ref 135–146)

## 2017-10-16 LAB — POCT GLYCOSYLATED HEMOGLOBIN (HGB A1C): HEMOGLOBIN A1C: 6.6

## 2017-10-16 NOTE — Progress Notes (Signed)
All labs are normal. 

## 2017-10-16 NOTE — Progress Notes (Signed)
Subjective:    CC: DM  HPI:  Diabetes - no hypoglycemic events. No wounds or sores that are not healing well. No increased thirst or urination. Checking glucose at home. Taking medications as prescribed without any side effects.  CKD 3 - no change in urination.    Insomnia - she ran out of the doxepin about 2 weeks ago for sleep and has been using melatonin and has been a little lighthheaded.    Past medical history, Surgical history, Family history not pertinant except as noted below, Social history, Allergies, and medications have been entered into the medical record, reviewed, and corrections made.   Review of Systems: No fevers, chills, night sweats, weight loss, chest pain, or shortness of breath.   Objective:    General: Well Developed, well nourished, and in no acute distress.  Neuro: Alert and oriented x3, extra-ocular muscles intact, sensation grossly intact.  HEENT: Normocephalic, atraumatic  Skin: Warm and dry, no rashes. Cardiac: Regular rate and rhythm, no murmurs rubs or gallops, no lower extremity edema.  Respiratory: Clear to auscultation bilaterally. Not using accessory muscles, speaking in full sentences. Neuro: Negative Dix-Hallpike maneuver.   Impression and Recommendations:    DM - Well controlled. Continue current regimen. Follow up in  3 months.    CKD 3 - due for BMP to recheck renal function.    Based on her symptoms I suspect she was having episodes of vertigo.  She did have a negative Dix-Hallpike maneuver today but overall is also feeling much better.  Insomnia-she still not sleeping well.  She is currently taking 5 mg of melatonin.  Okay to increase to 7.5 mg for a week and up to 10.  If she gets a 10 mg it is not effective then at that point I would discontinue it.

## 2017-10-18 ENCOUNTER — Other Ambulatory Visit: Payer: Self-pay | Admitting: Family Medicine

## 2017-10-18 ENCOUNTER — Telehealth: Payer: Self-pay | Admitting: *Deleted

## 2017-10-18 MED ORDER — AMBULATORY NON FORMULARY MEDICATION: 99 refills | Status: DC

## 2017-10-18 NOTE — Telephone Encounter (Signed)
Called pt and informed her that the samples were up front for her to p/u.Hannah Rojas, [place]

## 2017-10-18 NOTE — Progress Notes (Signed)
Pt requested new rx for strips for her gluocometer.

## 2017-10-26 ENCOUNTER — Other Ambulatory Visit: Payer: Self-pay | Admitting: Family Medicine

## 2017-11-26 ENCOUNTER — Other Ambulatory Visit: Payer: Self-pay | Admitting: Family Medicine

## 2017-12-11 ENCOUNTER — Other Ambulatory Visit: Payer: Self-pay | Admitting: Family Medicine

## 2017-12-18 ENCOUNTER — Other Ambulatory Visit: Payer: Self-pay | Admitting: Family Medicine

## 2017-12-21 ENCOUNTER — Telehealth: Payer: Self-pay | Admitting: Family Medicine

## 2017-12-21 MED ORDER — MONTELUKAST SODIUM 10 MG PO TABS: 10.0000 mg | ORAL_TABLET | Freq: Every day | ORAL | 3 refills | Status: DC

## 2017-12-21 MED ORDER — PROMETHAZINE-DM 6.25-15 MG/5ML PO SYRP: 5.0000 mL | ORAL_SOLUTION | Freq: Every evening | ORAL | 0 refills | Status: DC | PRN

## 2017-12-21 NOTE — Telephone Encounter (Signed)
Sent in cough syrup and allergy pill.  If not better then please let me know.

## 2017-12-21 NOTE — Telephone Encounter (Signed)
Pt called to request PCP send her in something for her allergies and her cough. States she is coughing so bad at nights it's interrupting her sleep. [facility].

## 2017-12-24 NOTE — Telephone Encounter (Signed)
Pt advised, she has not gotten Rx yet but will get it today. Does have an OV tomorrow for eval.

## 2017-12-25 ENCOUNTER — Ambulatory Visit (INDEPENDENT_AMBULATORY_CARE_PROVIDER_SITE_OTHER): Payer: PPO | Admitting: Family Medicine

## 2017-12-25 ENCOUNTER — Encounter: Payer: Self-pay | Admitting: Family Medicine

## 2017-12-25 VITALS — BP 100/87 | HR 86 | Temp 97.9°F | Wt 140.0 lb

## 2017-12-25 DIAGNOSIS — Z1231 Encounter for screening mammogram for malignant neoplasm of breast: Secondary | ICD-10-CM

## 2017-12-25 DIAGNOSIS — R05 Cough: Secondary | ICD-10-CM

## 2017-12-25 DIAGNOSIS — J0101 Acute recurrent maxillary sinusitis: Secondary | ICD-10-CM

## 2017-12-25 DIAGNOSIS — Z1239 Encounter for other screening for malignant neoplasm of breast: Secondary | ICD-10-CM

## 2017-12-25 DIAGNOSIS — R059 Cough, unspecified: Secondary | ICD-10-CM

## 2017-12-25 MED ORDER — IPRATROPIUM BROMIDE 0.06 % NA SOLN: 2.0000 | NASAL | 6 refills | Status: DC | PRN

## 2017-12-25 MED ORDER — GUAIFENESIN-CODEINE 100-10 MG/5ML PO SOLN: 5.0000 mL | Freq: Four times a day (QID) | ORAL | 0 refills | Status: DC | PRN

## 2017-12-25 MED ORDER — CEFDINIR 300 MG PO CAPS: 300.0000 mg | ORAL_CAPSULE | Freq: Two times a day (BID) | ORAL | 0 refills | Status: DC

## 2017-12-25 MED ORDER — BENZONATATE 200 MG PO CAPS: 200.0000 mg | ORAL_CAPSULE | Freq: Three times a day (TID) | ORAL | 0 refills | Status: DC | PRN

## 2017-12-25 NOTE — Patient Instructions (Signed)
Thank you for coming in today. Continue the allergy medicine.  Use the cough medicines as needed.  If you get worse take the antibiotic but dont take it unless you have to.   Call or go to the emergency room if you get worse, have trouble breathing, have chest pains, or palpitations.    Sinusitis, Adult Sinusitis is soreness and inflammation of your sinuses. Sinuses are hollow spaces in the bones around your face. Your sinuses are located:  Around your eyes.  In the middle of your forehead.  Behind your nose.  In your cheekbones.  Your sinuses and nasal passages are lined with a stringy fluid (mucus). Mucus normally drains out of your sinuses. When your nasal tissues become inflamed or swollen, the mucus can become trapped or blocked so air cannot flow through your sinuses. This allows bacteria, viruses, and funguses to grow, which leads to infection. Sinusitis can develop quickly and last for 7?10 days (acute) or for more than 12 weeks (chronic). Sinusitis often develops after a cold. What are the causes? This condition is caused by anything that creates swelling in the sinuses or stops mucus from draining, including:  Allergies.  Asthma.  Bacterial or viral infection.  Abnormally shaped bones between the nasal passages.  Nasal growths that contain mucus (nasal polyps).  Narrow sinus openings.  Pollutants, such as chemicals or irritants in the air.  A foreign object stuck in the nose.  A fungal infection. This is rare.  What increases the risk? The following factors may make you more likely to develop this condition:  Having allergies or asthma.  Having had a recent cold or respiratory tract infection.  Having structural deformities or blockages in your nose or sinuses.  Having a weak immune system.  Doing a lot of swimming or diving.  Overusing nasal sprays.  Smoking.  What are the signs or symptoms? The main symptoms of this condition are pain and a  feeling of pressure around the affected sinuses. Other symptoms include:  Upper toothache.  Earache.  Headache.  Bad breath.  Decreased sense of smell and taste.  A cough that may get worse at night.  Fatigue.  Fever.  Thick drainage from your nose. The drainage is often green and it may contain pus (purulent).  Stuffy nose or congestion.  Postnasal drip. This is when extra mucus collects in the throat or back of the nose.  Swelling and warmth over the affected sinuses.  Sore throat.  Sensitivity to light.  How is this diagnosed? This condition is diagnosed based on symptoms, a medical history, and a physical exam. To find out if your condition is acute or chronic, your health care provider may:  Look in your nose for signs of nasal polyps.  Tap over the affected sinus to check for signs of infection.  View the inside of your sinuses using an imaging device that has a light attached (endoscope).  If your health care provider suspects that you have chronic sinusitis, you may also:  Be tested for allergies.  Have a sample of mucus taken from your nose (nasal culture) and checked for bacteria.  Have a mucus sample examined to see if your sinusitis is related to an allergy.  If your sinusitis does not respond to treatment and it lasts longer than 8 weeks, you may have an MRI or CT scan to check your sinuses. These scans also help to determine how severe your infection is. In rare cases, a bone biopsy may be  done to rule out more serious types of fungal sinus disease. How is this treated? Treatment for sinusitis depends on the cause and whether your condition is chronic or acute. If a virus is causing your sinusitis, your symptoms will go away on their own within 10 days. You may be given medicines to relieve your symptoms, including:  Topical nasal decongestants. They shrink swollen nasal passages and let mucus drain from your sinuses.  Antihistamines. These drugs  block inflammation that is triggered by allergies. This can help to ease swelling in your nose and sinuses.  Topical nasal corticosteroids. These are nasal sprays that ease inflammation and swelling in your nose and sinuses.  Nasal saline washes. These rinses can help to get rid of thick mucus in your nose.  If your condition is caused by bacteria, you will be given an antibiotic medicine. If your condition is caused by a fungus, you will be given an antifungal medicine. Surgery may be needed to correct underlying conditions, such as narrow nasal passages. Surgery may also be needed to remove polyps. Follow these instructions at home: Medicines  Take, use, or apply over-the-counter and prescription medicines only as told by your health care provider. These may include nasal sprays.  If you were prescribed an antibiotic medicine, take it as told by your health care provider. Do not stop taking the antibiotic even if you start to feel better. Hydrate and Humidify  Drink enough water to keep your urine clear or pale yellow. Staying hydrated will help to thin your mucus.  Use a cool mist humidifier to keep the humidity level in your home above 50%.  Inhale steam for 10-15 minutes, 3-4 times a day or as told by your health care provider. You can do this in the bathroom while a hot shower is running.  Limit your exposure to cool or dry air. Rest  Rest as much as possible.  Sleep with your head raised (elevated).  Make sure to get enough sleep each night. General instructions  Apply a warm, moist washcloth to your face 3-4 times a day or as told by your health care provider. This will help with discomfort.  Wash your hands often with soap and water to reduce your exposure to viruses and other germs. If soap and water are not available, use hand sanitizer.  Do not smoke. Avoid being around people who are smoking (secondhand smoke).  Keep all follow-up visits as told by your health care  provider. This is important. Contact a health care provider if:  You have a fever.  Your symptoms get worse.  Your symptoms do not improve within 10 days. Get help right away if:  You have a severe headache.  You have persistent vomiting.  You have pain or swelling around your face or eyes.  You have vision problems.  You develop confusion.  Your neck is stiff.  You have trouble breathing. This information is not intended to replace advice given to you by your health care provider. Make sure you discuss any questions you have with your health care provider. Document Released: 09/04/2005 Document Revised: 04/30/2016 Document Reviewed: 06/30/2015 Elsevier Interactive Patient Education  [employer].

## 2017-12-25 NOTE — Progress Notes (Signed)
Hannah Rojas is a 72 y.o. female who presents to [facility] today for cough.  Patient notes that about 3 weeks ago she developed cough and congestion mild fevers and chills.  Her symptoms have resolved the exception of cough and sinus pressure.  She notes the cough is nonproductive and not associated with much wheezing.  It does interfere with sleep at times however.  She additionally notes sinus pressure which is obnoxious but not associated with fevers or chills either.  She is tried some over-the-counter medications which have helped a bit.  She denies vomiting diarrhea chest pain or palpitations.   Past Medical History:  Diagnosis Date  . Cataracts, bilateral    bilateral  . H. pylori infection    history  . Pancreatic cyst    hx mucinous cystoadenoma   Past Surgical History:  Procedure Laterality Date  . CHOLECYSTECTOMY    . ESOPHAGOGASTRODUODENOSCOPY    . PANCREATIC CYST EXCISION    . spleenectomy     Social History   Tobacco Use  . Smoking status: Never Smoker  . Smokeless tobacco: Never Used  Substance Use Topics  . Alcohol use: No   family history is not on file.  ROS as above:  Medications: Current Outpatient Medications  Medication Sig Dispense Refill  . ACCU-CHEK AVIVA PLUS test strip Test 3 times daily. Dx: E11.9 300 each 4  . AMBULATORY NON FORMULARY MEDICATION Medication Name:  Test strips for diabetic glucometer. Dx: Diabetes. 100 Units PRN  . doxepin (SINEQUAN) 10 MG capsule Take 10 mg by mouth daily.    Marland Kitchen GLYXAMBI 10-5 MG TABS Take 1 tablet by mouth every morning. 30 tablet 6  . lisinopril (PRINIVIL,ZESTRIL) 2.5 MG tablet Take 1 tablet (2.5 mg total) by mouth daily. 90 tablet 1  . Melatonin 5 MG TABS Take 1 tablet by mouth at bedtime.    . metFORMIN (GLUCOPHAGE) 1000 MG tablet TAKE 1 TABLET TWICE DAILY WITH MEALS 60 tablet 0  .  montelukast (SINGULAIR) 10 MG tablet Take 1 tablet (10 mg total) by mouth at bedtime. 30 tablet 3  . rosuvastatin (CRESTOR) 20 MG tablet Take 1 tablet (20 mg total) by mouth at bedtime. 90 tablet 1  . benzonatate (TESSALON) 200 MG capsule Take 1 capsule (200 mg total) by mouth 3 (three) times daily as needed for cough. 45 capsule 0  . cefdinir (OMNICEF) 300 MG capsule Take 1 capsule (300 mg total) by mouth 2 (two) times daily. Back up antibiotic if worse 14 capsule 0  . guaiFENesin-codeine 100-10 MG/5ML syrup Take 5 mLs by mouth every 6 (six) hours as needed for cough. 120 mL 0  . ipratropium (ATROVENT) 0.06 % nasal spray Place 2 sprays into both nostrils every 4 (four) hours as needed for rhinitis. 10 mL 6   No current facility-administered medications for this visit.    Allergies  Allergen Reactions  . Acetaminophen Rash  . Ibuprofen Rash  . Atorvastatin Other (See Comments)    HA  . Iodinated Diagnostic Agents     IV Contrast  . Naproxen Sodium   . Pravastatin Other (See Comments)    dizziness  . Simvastatin Other (See Comments)    Nightmares  . Sulfonamide Derivatives     REACTION: rash  . Trazodone And Nefazodone Other (See Comments)    Nightmares     Health Maintenance Health Maintenance  Topic Date Due  . MAMMOGRAM  12/14/2017  .  OPHTHALMOLOGY EXAM  10/16/2018 (Originally 04/24/2017)  . HEMOGLOBIN A1C  04/15/2018  . INFLUENZA VACCINE  04/18/2018  . FOOT EXAM  10/16/2018  . DEXA SCAN  12/27/2019  . COLONOSCOPY  05/08/2022  . TETANUS/TDAP  01/26/2027  . Hepatitis C Screening  Completed  . PNA vac Low Risk Adult  Completed     Exam:  BP 100/87   Pulse 86   Temp 97.9 F (36.6 C) (Oral)   Wt 140 lb (63.5 kg)   SpO2 96%   BMI 26.45 kg/m  Gen: Well NAD HEENT: EOMI,  MMM clear nasal discharge.  Inflamed nasal turbinates bilaterally. Frontal sinuses are nontender.  No significant cervical lymphadenopathy.  Normal tympanic membranes bilaterally. Lungs: Normal work  of breathing. CTABL Heart: RRR no MRG Abd: NABS, Soft. Nondistended, Nontender Exts: Brisk capillary refill, warm and well perfused.    No results found for this or any previous visit (from the past 72 hour(s)). No results found.    Assessment and Plan: 72 y.o. female with post viral cough and likely seasonal allergies and rhinitis.  Plan for symptomatic management with over-the-counter medications.  Additionally will treat with Tessalon Perles Atrovent nasal spray codeine cough syrup.  Recommend also using Flonase nasal spray.  Backup Omnicef use if not better.  Additionally mammogram ordered as part of health maintenance.   Orders Placed This Encounter  Procedures  . MM 3D SCREEN BREAST BILATERAL    Standing Status:   Future    Standing Expiration Date:   02/25/2019    Order Specific Question:   Reason for Exam (SYMPTOM  OR DIAGNOSIS REQUIRED)    Answer:   screen breast cancer    Order Specific Question:   Preferred imaging location?    Answer:   [facility]   Meds ordered this encounter  Medications  . benzonatate (TESSALON) 200 MG capsule    Sig: Take 1 capsule (200 mg total) by mouth 3 (three) times daily as needed for cough.    Dispense:  45 capsule    Refill:  0  . ipratropium (ATROVENT) 0.06 % nasal spray    Sig: Place 2 sprays into both nostrils every 4 (four) hours as needed for rhinitis.    Dispense:  10 mL    Refill:  6  . guaiFENesin-codeine 100-10 MG/5ML syrup    Sig: Take 5 mLs by mouth every 6 (six) hours as needed for cough.    Dispense:  120 mL    Refill:  0  . cefdinir (OMNICEF) 300 MG capsule    Sig: Take 1 capsule (300 mg total) by mouth 2 (two) times daily. Back up antibiotic if worse    Dispense:  14 capsule    Refill:  0     Discussed warning signs or symptoms. Please see discharge instructions. Patient expresses understanding.

## 2017-12-26 ENCOUNTER — Other Ambulatory Visit: Payer: Self-pay | Admitting: Family Medicine

## 2017-12-28 ENCOUNTER — Ambulatory Visit (INDEPENDENT_AMBULATORY_CARE_PROVIDER_SITE_OTHER): Payer: PPO

## 2017-12-28 DIAGNOSIS — Z1231 Encounter for screening mammogram for malignant neoplasm of breast: Secondary | ICD-10-CM

## 2017-12-28 DIAGNOSIS — Z1239 Encounter for other screening for malignant neoplasm of breast: Secondary | ICD-10-CM

## 2018-01-09 ENCOUNTER — Telehealth: Payer: Self-pay | Admitting: Family Medicine

## 2018-01-09 NOTE — Telephone Encounter (Signed)
OK we can try the invokamet but will need to find a replacement for the Tradjenta. We can try Januvia and Invokamet.  Please call her and see if she is ok with this or not?

## 2018-01-09 NOTE — Telephone Encounter (Signed)
Pt called clinic today stating the Rx for GLYXAMBI 10-5 MG TABS has gone from $45/month to $80/month. Pt reports she contact her insurance and they advised we should complete a tier exception or change the Rx.

## 2018-01-09 NOTE — Telephone Encounter (Signed)
I did some research and the tier exception is not possible and patient would need to change to Invokamet.

## 2018-01-09 NOTE — Telephone Encounter (Signed)
Left message for patient to call back about medication changes.

## 2018-01-10 NOTE — Telephone Encounter (Signed)
Left message for patient to call back about medication changes.

## 2018-01-23 NOTE — Telephone Encounter (Signed)
Hannah Rojas agrees to start both medications. She uses [facility].

## 2018-01-24 MED ORDER — CANAGLIFLOZIN-METFORMIN HCL ER 50-1000 MG PO TB24: 1.0000 | ORAL_TABLET | Freq: Every day | ORAL | 5 refills | Status: DC

## 2018-01-24 MED ORDER — SITAGLIPTIN PHOSPHATE 100 MG PO TABS: 100.0000 mg | ORAL_TABLET | Freq: Every day | ORAL | 5 refills | Status: DC

## 2018-01-24 NOTE — Addendum Note (Signed)
Addended by: Beatrice Lecher D on: 01/24/2018 07:46 AM   Modules accepted: Orders

## 2018-01-24 NOTE — Telephone Encounter (Signed)
Both Rx sent to pharmacy ?

## 2018-02-12 ENCOUNTER — Ambulatory Visit: Payer: PPO | Admitting: Family Medicine

## 2018-02-13 ENCOUNTER — Ambulatory Visit: Payer: Medicare Other | Admitting: Family Medicine

## 2018-02-18 ENCOUNTER — Encounter: Payer: Self-pay | Admitting: Family Medicine

## 2018-02-18 ENCOUNTER — Ambulatory Visit (INDEPENDENT_AMBULATORY_CARE_PROVIDER_SITE_OTHER): Payer: PPO | Admitting: Family Medicine

## 2018-02-18 VITALS — BP 106/54 | HR 79 | Ht 61.0 in | Wt 143.0 lb

## 2018-02-18 DIAGNOSIS — N183 Chronic kidney disease, stage 3 unspecified: Secondary | ICD-10-CM

## 2018-02-18 DIAGNOSIS — E119 Type 2 diabetes mellitus without complications: Secondary | ICD-10-CM

## 2018-02-18 DIAGNOSIS — E785 Hyperlipidemia, unspecified: Secondary | ICD-10-CM | POA: Diagnosis not present

## 2018-02-18 DIAGNOSIS — E1169 Type 2 diabetes mellitus with other specified complication: Secondary | ICD-10-CM | POA: Diagnosis not present

## 2018-02-18 LAB — COMPLETE METABOLIC PANEL WITH GFR
AG Ratio: 1.5 (calc) (ref 1.0–2.5)
ALBUMIN MSPROF: 4.5 g/dL (ref 3.6–5.1)
ALKALINE PHOSPHATASE (APISO): 63 U/L (ref 33–130)
ALT: 17 U/L (ref 6–29)
AST: 21 U/L (ref 10–35)
BUN/Creatinine Ratio: 22 (calc) (ref 6–22)
BUN: 22 mg/dL (ref 7–25)
CALCIUM: 10.1 mg/dL (ref 8.6–10.4)
CO2: 29 mmol/L (ref 20–32)
CREATININE: 1 mg/dL — AB (ref 0.60–0.93)
Chloride: 103 mmol/L (ref 98–110)
GFR, EST NON AFRICAN AMERICAN: 57 mL/min/{1.73_m2} — AB (ref >=60)
GFR, Est African American: 66 mL/min/{1.73_m2} (ref >=60)
GLUCOSE: 109 mg/dL — AB (ref 65–99)
Globulin: 3.1 g/dL (calc) (ref 1.9–3.7)
Potassium: 4.6 mmol/L (ref 3.5–5.3)
Sodium: 139 mmol/L (ref 135–146)
TOTAL PROTEIN: 7.6 g/dL (ref 6.1–8.1)
Total Bilirubin: 0.8 mg/dL (ref 0.2–1.2)

## 2018-02-18 LAB — POCT GLYCOSYLATED HEMOGLOBIN (HGB A1C): Hemoglobin A1C: 6.4 % — AB (ref 4.0–5.6)

## 2018-02-18 LAB — LIPID PANEL
CHOL/HDL RATIO: 1.8 (calc) (ref <5.0)
CHOLESTEROL: 128 mg/dL (ref <200)
HDL: 73 mg/dL (ref >50)
LDL CHOLESTEROL (CALC): 38 mg/dL
NON-HDL CHOLESTEROL (CALC): 55 mg/dL (ref <130)
Triglycerides: 91 mg/dL (ref <150)

## 2018-02-18 LAB — MICROALBUMIN / CREATININE URINE RATIO
Creatinine, Urine: 68 mg/dL (ref 20–275)
Microalb Creat Ratio: 16 mcg/mg creat (ref <30)
Microalb, Ur: 1.1 mg/dL

## 2018-02-18 MED ORDER — ACCU-CHEK AVIVA PLUS VI STRP: ORAL_STRIP | 4 refills | Status: DC

## 2018-02-18 NOTE — Progress Notes (Signed)
Subjective:    CC: DM, lipids.    HPI: Diabetes - no hypoglycemic events. No wounds or sores that are not healing well. No increased thirst or urination. Checking glucose at home. Taking medications as prescribed without any side effects.  He had to discontinue the Glyxambi because her insurance would no longer cover it.  Currently on Tradjenta and invokamet.  She is also on a statin and ACE inhibitor.  Globin A1c is 6.9 today.  Up slightly from previous of 6.4.  Hyperlipidemia-tolerating statin well without any side effects or problems.  CKD 3 - doing well.   Past medical history, Surgical history, Family history not pertinant except as noted below, Social history, Allergies, and medications have been entered into the medical record, reviewed, and corrections made.   Review of Systems: No fevers, chills, night sweats, weight loss, chest pain, or shortness of breath.   Objective:    General: Well Developed, well nourished, and in no acute distress.  Neuro: Alert and oriented x3, extra-ocular muscles intact, sensation grossly intact.  HEENT: Normocephalic, atraumatic  Skin: Warm and dry, no rashes. Cardiac: Regular rate and rhythm, no murmurs rubs or gallops, no lower extremity edema.  Respiratory: Clear to auscultation bilaterally. Not using accessory muscles, speaking in full sentences.   Impression and Recommendations:    DM - Well controlled. Continue current regimen. Follow up in  4 months.  Globin A1c is 6.9 today.  Up slightly from previous of 6.4. Reminded to schedule eye exam. She can  check with her insurance on coverage. .   Hyperlipidemia-due to recheck lipid panel.  CKD 3 - due to recheck renal function.

## 2018-03-05 ENCOUNTER — Other Ambulatory Visit: Payer: Self-pay | Admitting: *Deleted

## 2018-03-05 DIAGNOSIS — E119 Type 2 diabetes mellitus without complications: Secondary | ICD-10-CM

## 2018-03-05 MED ORDER — BLOOD GLUCOSE METER KIT: PACK | 0 refills | Status: DC

## 2018-03-20 MED ORDER — LISINOPRIL 2.5 MG PO TABS: 2.5000 mg | ORAL_TABLET | Freq: Every day | ORAL | 1 refills | Status: DC

## 2018-03-20 NOTE — Addendum Note (Signed)
Addended by: Dessie Coma on: 03/20/2018 04:34 PM   Modules accepted: Orders

## 2018-04-03 ENCOUNTER — Telehealth: Payer: Self-pay

## 2018-04-03 NOTE — Telephone Encounter (Signed)
Left message advising of recommendations.  

## 2018-04-03 NOTE — Telephone Encounter (Signed)
Hannah Rojas called and states she is taking Invokamet and metformin together. I advised that I think she should just be on the Invokamet. Please advise.

## 2018-04-03 NOTE — Telephone Encounter (Signed)
Call patient: Just have her take the invoke a met.  Sometimes I will add an extra metformin but for now I just want her to take the invoke a met by itself.

## 2018-04-09 NOTE — Telephone Encounter (Signed)
Patient advised of recommendations.  

## 2018-06-12 ENCOUNTER — Other Ambulatory Visit: Payer: Self-pay

## 2018-06-12 NOTE — Patient Outreach (Signed)
[facility]) Care Management  06/12/2018  Hannah Rojas August 17, 1946 [id number]  TELEPHONE SCREENING Referral date: 06/05/18 Referral source: primary MD  Referral reason: medication assistance Insurance: Health team advantage Attempt #1   Telephone call to patient regarding referral. Unable to reach patient. HIPAA compliant voice message left with call back phone number.   PLAN: RNCM will attempt 2nd telephone call to patient within 4 business days. RNCM will send outreach letter.   Quinn Plowman RN,BSN, [place] Telephonic  [phone]

## 2018-06-17 ENCOUNTER — Other Ambulatory Visit: Payer: Self-pay

## 2018-06-17 NOTE — Patient Outreach (Signed)
[facility]) Care Management  06/17/2018  BRITINEY BLAHNIK 10/17/1945 [id number]  TELEPHONE SCREENING Referral date: 06/05/18 Referral source: primary MD  Referral reason: medication assistance Insurance: Health team advantage Attempt #2  Telephone call to patient regarding referral. Unable to reach patient. HIPAA compliant voice message left with call back phone number.   PLAN: RNCM will attempt 3rd telephone call to patient within 4 business days.   Quinn Plowman RN,BSN, [place] Telephonic  [phone]

## 2018-06-19 ENCOUNTER — Encounter: Payer: Self-pay | Admitting: Family Medicine

## 2018-06-19 ENCOUNTER — Other Ambulatory Visit: Payer: Self-pay

## 2018-06-19 ENCOUNTER — Telehealth: Payer: Self-pay | Admitting: Family Medicine

## 2018-06-19 ENCOUNTER — Ambulatory Visit (INDEPENDENT_AMBULATORY_CARE_PROVIDER_SITE_OTHER): Payer: PPO | Admitting: Family Medicine

## 2018-06-19 VITALS — BP 117/52 | HR 73 | Ht 61.0 in | Wt 150.0 lb

## 2018-06-19 DIAGNOSIS — Z23 Encounter for immunization: Secondary | ICD-10-CM

## 2018-06-19 DIAGNOSIS — M7061 Trochanteric bursitis, right hip: Secondary | ICD-10-CM | POA: Diagnosis not present

## 2018-06-19 DIAGNOSIS — E119 Type 2 diabetes mellitus without complications: Secondary | ICD-10-CM

## 2018-06-19 LAB — POCT GLYCOSYLATED HEMOGLOBIN (HGB A1C): HEMOGLOBIN A1C: 6.4 % — AB (ref 4.0–5.6)

## 2018-06-19 MED ORDER — CANAGLIFLOZIN-METFORMIN HCL ER 50-1000 MG PO TB24: 1.0000 | ORAL_TABLET | Freq: Every day | ORAL | 5 refills | Status: DC

## 2018-06-19 MED ORDER — DOXEPIN HCL 10 MG PO CAPS: 10.0000 mg | ORAL_CAPSULE | Freq: Every evening | ORAL | 1 refills | Status: DC | PRN

## 2018-06-19 NOTE — Patient Outreach (Signed)
[facility]) Care Management  06/19/2018  LUCINE BILSKI Jul 07, 1946 [id number]   TELEPHONE SCREENING Referral date:06/05/18 Referral source:primary MD Referral reason:medication assistance Insurance:Health team advantage Attempt #3  Telephone call to patient regarding referral. Unable to reach patient. HIPAA compliant voice message left with call back phone number.   PLAN: If no response will proceed with closure.   Quinn Plowman RN,BSN, [place] Telephonic  [phone]

## 2018-06-19 NOTE — Progress Notes (Signed)
Subjective:    CC: DM  HPI:  Diabetes - no hypoglycemic events. No wounds or sores that are not healing well. No increased thirst or urination. Checking glucose at home. Taking medications as prescribed without any side effects. She is back on plain metformin and off of Invokamet.    She also has had some right hip pain and has been taking ES tyelnol and wants to make sure she is OK to do that. It does help her pain.  Says is really been bothering her for about a month.  She notices it more when she is active during the day.  It is not painful when she sleeps on that hip at night.  Past medical history, Surgical history, Family history not pertinant except as noted below, Social history, Allergies, and medications have been entered into the medical record, reviewed, and corrections made.   Review of Systems: No fevers, chills, night sweats, weight loss, chest pain, or shortness of breath.   Objective:    General: Well Developed, well nourished, and in no acute distress.  Neuro: Alert and oriented x3, extra-ocular muscles intact, sensation grossly intact.  HEENT: Normocephalic, atraumatic  Skin: Warm and dry, no rashes. Cardiac: Regular rate and rhythm, no murmurs rubs or gallops, no lower extremity edema.  Respiratory: Clear to auscultation bilaterally. Not using accessory muscles, speaking in full sentences. MSK: Both hips with normal flexion extension.  Strength is 5 out of 5 at hips knees and ankles bilaterally.  Normal lumbar flexion extension rotation and side bending.  She has not had any back pain at all.  Tender over the right greater trochanter.   Impression and Recommendations:    DM - Well controlled. Continue current regimen. Follow up in  4 months.    Right hip pain -most consistent with bursitis based on history and exam.  Recommend treat with home PT exercises and okay to use Tylenol as needed.  Ice if needed as well.  If not improving over the next 3 to 4 weeks please let  us know and we could always do an injection if needed.

## 2018-06-19 NOTE — Telephone Encounter (Signed)
Yes, she should be on both. Also please tell her I sent over a new rx for doxepin for sleep. Try for a month and see if helps.

## 2018-06-19 NOTE — Telephone Encounter (Signed)
Patient came in the office and one of the girls at the front desk asked me to come up and help the patient. She states that Invokamet is not on her current list and Januvia is on the list. Patient reports she is taking the Invokamet and Januvia daily. Does she need to stop the Invokamet?  Please advise.

## 2018-06-19 NOTE — Telephone Encounter (Signed)
Pt advised, verbalized understanding. No further questions.

## 2018-06-20 ENCOUNTER — Ambulatory Visit: Payer: PPO | Admitting: Family Medicine

## 2018-06-21 ENCOUNTER — Ambulatory Visit: Payer: Self-pay

## 2018-07-17 ENCOUNTER — Other Ambulatory Visit: Payer: Self-pay

## 2018-07-17 NOTE — Patient Outreach (Signed)
[facility]) Care Management  07/17/2018  TARAANN OLTHOFF 06-15-1946 [id number]  CASE CLOSURE Referral date:06/05/18 Referral source:primary MD Referral reason:medication assistance Insurance:Health team advantage   No response after 3 telephone calls and outreach letter attempt.  PLAN: RNCM will close patient due to being unable to reach.  RNCM will send closure notification to patient's primary MD   Quinn Plowman RN,BSN,CCM [facility] Telephonic  [phone]

## 2018-07-31 ENCOUNTER — Other Ambulatory Visit: Payer: Self-pay | Admitting: Family Medicine

## 2018-08-10 LAB — HM DIABETES EYE EXAM

## 2018-08-20 LAB — FECAL OCCULT BLOOD, IMMUNOCHEMICAL: IFOBT: NEGATIVE

## 2018-09-05 ENCOUNTER — Encounter: Payer: Self-pay | Admitting: Family Medicine

## 2018-09-05 ENCOUNTER — Other Ambulatory Visit: Payer: Self-pay | Admitting: Family Medicine

## 2018-09-16 ENCOUNTER — Other Ambulatory Visit: Payer: Self-pay | Admitting: Family Medicine

## 2018-09-16 NOTE — Telephone Encounter (Signed)
Routing to pcp for formulary change.Hannah Rojas, [place]

## 2018-09-19 ENCOUNTER — Encounter: Payer: Self-pay | Admitting: Family Medicine

## 2018-10-23 ENCOUNTER — Ambulatory Visit: Payer: PPO | Admitting: Family Medicine

## 2018-10-28 ENCOUNTER — Encounter: Payer: Self-pay | Admitting: Family Medicine

## 2018-10-28 ENCOUNTER — Ambulatory Visit (INDEPENDENT_AMBULATORY_CARE_PROVIDER_SITE_OTHER): Payer: PPO | Admitting: Family Medicine

## 2018-10-28 ENCOUNTER — Other Ambulatory Visit: Payer: Self-pay | Admitting: Family Medicine

## 2018-10-28 VITALS — BP 114/56 | HR 72 | Ht 61.0 in | Wt 150.0 lb

## 2018-10-28 DIAGNOSIS — N183 Chronic kidney disease, stage 3 unspecified: Secondary | ICD-10-CM

## 2018-10-28 DIAGNOSIS — F5101 Primary insomnia: Secondary | ICD-10-CM | POA: Diagnosis not present

## 2018-10-28 DIAGNOSIS — E119 Type 2 diabetes mellitus without complications: Secondary | ICD-10-CM | POA: Diagnosis not present

## 2018-10-28 LAB — POCT GLYCOSYLATED HEMOGLOBIN (HGB A1C): Hemoglobin A1C: 6.7 % — AB (ref 4.0–5.6)

## 2018-10-28 NOTE — Progress Notes (Signed)
Subjective:    CC:   HPI:  Diabetes - no hypoglycemic events. No wounds or sores that are not healing well. No increased thirst or urination. Checking glucose at home. Taking medications as prescribed without any side effects.  F/U or CKD 3 - due to recheck renal function. No problems.    F/U insomnia - she is doing well. She is trying to cut back on the doxepin.    Past medical history, Surgical history, Family history not pertinant except as noted below, Social history, Allergies, and medications have been entered into the medical record, reviewed, and corrections made.   Review of Systems: No fevers, chills, night sweats, weight loss, chest pain, or shortness of breath.   Objective:    General: Well Developed, well nourished, and in no acute distress.  Neuro: Alert and oriented x3, extra-ocular muscles intact, sensation grossly intact.  HEENT: Normocephalic, atraumatic  Skin: Warm and dry, no rashes. Cardiac: Regular rate and rhythm, no murmurs rubs or gallops, no lower extremity edema.  Respiratory: Clear to auscultation bilaterally. Not using accessory muscles, speaking in full sentences.   Impression and Recommendations:     DM - Well controlled. Continue current regimen. Follow up in  3 months. She wants to see if a vitamin supplement she is taking will lower her sugars and help her lose weightso will f/u in 3 months. Her goal is to decrease her medication.     CKD - 3  - check BMP  Insomnia - Cutting back on her doxepin.

## 2018-10-29 LAB — BASIC METABOLIC PANEL
BUN / CREAT RATIO: 18 (calc) (ref 6–22)
BUN: 20 mg/dL (ref 7–25)
CO2: 25 mmol/L (ref 20–32)
Calcium: 10.1 mg/dL (ref 8.6–10.4)
Chloride: 104 mmol/L (ref 98–110)
Creat: 1.11 mg/dL — ABNORMAL HIGH (ref 0.60–0.93)
Glucose, Bld: 97 mg/dL (ref 65–99)
POTASSIUM: 4.1 mmol/L (ref 3.5–5.3)
Sodium: 138 mmol/L (ref 135–146)

## 2018-12-17 ENCOUNTER — Other Ambulatory Visit: Payer: Self-pay | Admitting: Family Medicine

## 2019-01-02 ENCOUNTER — Other Ambulatory Visit: Payer: Self-pay | Admitting: Family Medicine

## 2019-01-13 ENCOUNTER — Ambulatory Visit (INDEPENDENT_AMBULATORY_CARE_PROVIDER_SITE_OTHER): Payer: PPO | Admitting: Family Medicine

## 2019-01-13 ENCOUNTER — Encounter: Payer: Self-pay | Admitting: Family Medicine

## 2019-01-13 VITALS — BP 119/70 | HR 77 | Temp 98.0°F | Ht 61.0 in | Wt 148.0 lb

## 2019-01-13 DIAGNOSIS — E119 Type 2 diabetes mellitus without complications: Secondary | ICD-10-CM | POA: Diagnosis not present

## 2019-01-13 DIAGNOSIS — H9202 Otalgia, left ear: Secondary | ICD-10-CM

## 2019-01-13 DIAGNOSIS — H6122 Impacted cerumen, left ear: Secondary | ICD-10-CM | POA: Diagnosis not present

## 2019-01-13 DIAGNOSIS — E785 Hyperlipidemia, unspecified: Secondary | ICD-10-CM | POA: Diagnosis not present

## 2019-01-13 DIAGNOSIS — E1169 Type 2 diabetes mellitus with other specified complication: Secondary | ICD-10-CM | POA: Diagnosis not present

## 2019-01-13 DIAGNOSIS — N183 Chronic kidney disease, stage 3 unspecified: Secondary | ICD-10-CM

## 2019-01-13 NOTE — Progress Notes (Signed)
Acute Office Visit  Subjective:    Patient ID: Hannah Rojas, female    DOB: Apr 18, 1946, 73 y.o.   MRN: [id number]  Chief Complaint  Patient presents with  . Ear Pain    HPI Patient is in today for Patient is in today for left ear pain.  She is noticed at work that she had been hearing as well and was wondering if she was starting to get cerumen impaction again.  No fevers chills or sweats.  No cold symptoms.  She has still been working but has been trying to take distancing precautions.  Diabetes - no hypoglycemic events. No wounds or sores that are not healing well. No increased thirst or urination. Checking glucose at home. Taking medications as prescribed without any side effects.  CKD 3-no recent changes.  Hyperlipidemia-taking statin without any side effects or problems.  Past Medical History:  Diagnosis Date  . Cataracts, bilateral    bilateral  . H. pylori infection    history  . Pancreatic cyst    hx mucinous cystoadenoma    Past Surgical History:  Procedure Laterality Date  . CHOLECYSTECTOMY    . ESOPHAGOGASTRODUODENOSCOPY    . PANCREATIC CYST EXCISION    . spleenectomy      History reviewed. No pertinent family history.  Social History   Socioeconomic History  . Marital status: Widowed    Spouse name: Not on file  . Number of children: Not on file  . Years of education: Not on file  . Highest education level: Not on file  Occupational History  . Not on file  Social Needs  . Financial resource strain: Not on file  . Food insecurity:    Worry: Not on file    Inability: Not on file  . Transportation needs:    Medical: Not on file    Non-medical: Not on file  Tobacco Use  . Smoking status: Never Smoker  . Smokeless tobacco: Never Used  Substance and Sexual Activity  . Alcohol use: No  . Drug use: Not on file  . Sexual activity: Not on file    Comment: works at [employer]  . Physical activity:    Days per week: Not on file   Minutes per session: Not on file  . Stress: Not on file  Relationships  . Social connections:    Talks on phone: Not on file    Gets together: Not on file    Attends religious service: Not on file    Active member of club or organization: Not on file    Attends meetings of clubs or organizations: Not on file    Relationship status: Not on file  . Intimate partner violence:    Fear of current or ex partner: Not on file    Emotionally abused: Not on file    Physically abused: Not on file    Forced sexual activity: Not on file  Other Topics Concern  . Not on file  Social History Narrative  . Not on file    Outpatient Medications Prior to Visit  Medication Sig Dispense Refill  . Acetaminophen (TYLENOL 8 HOUR ARTHRITIS PAIN PO) Take 1 tablet by mouth at bedtime as needed.    . AMBULATORY NON FORMULARY MEDICATION Take 1 packet by mouth daily. Medication Name: THRIVE dietary supplement drink    . blood glucose meter kit and supplies Dispense based on patient and insurance preference. Use up to four times daily as directed. Dx:E11.9 1  each 0  . doxepin (SINEQUAN) 10 MG capsule TAKE 1 CAPSULE BY MOUTH AT BEDTIME AS NEEDED FOR SLEEP 30 capsule 1  . JANUVIA 100 MG tablet Take 1 tablet by mouth once daily 90 tablet 0  . lisinopril (PRINIVIL,ZESTRIL) 2.5 MG tablet Take 1 tablet by mouth once daily 90 tablet 0  . ONE TOUCH ULTRA TEST test strip USE 1 STRIP TO CHECK GLUCOSE UP TO 4 TIMES DAILY AS DIRECTED. 300 each 11  . rosuvastatin (CRESTOR) 20 MG tablet Take 1 tablet (20 mg total) by mouth at bedtime. 90 tablet 1  . Canagliflozin-metFORMIN HCl ER (INVOKAMET XR) 50-1000 MG TB24 Take 1 tablet by mouth daily. 30 tablet 5   No facility-administered medications prior to visit.     Allergies  Allergen Reactions  . Ibuprofen Rash  . Iodinated Diagnostic Agents Rash    IV Contrast  . Sulfa Antibiotics Rash  . Atorvastatin Other (See Comments)    HA  . Naproxen Sodium   . Pravastatin Other  (See Comments)    dizziness  . Simvastatin Other (See Comments)    Nightmares  . Sulfonamide Derivatives     REACTION: rash  . Trazodone And Nefazodone Other (See Comments)    Nightmares     ROS     Objective:    Physical Exam  BP 119/70   Pulse 77   Temp 98 F (36.7 C)   Ht _0  (1.549 m)   Wt 148 lb (67.1 kg)   SpO2 98%   BMI 27.96 kg/m  Wt Readings from Last 3 Encounters:  01/13/19 148 lb (67.1 kg)  10/28/18 150 lb (68 kg)  06/19/18 150 lb (68 kg)    There are no preventive care reminders to display for this patient.  There are no preventive care reminders to display for this patient.   Lab Results  Component Value Date   TSH 1.940 03/04/2012   Lab Results  Component Value Date   WBC 11.8 (H) 02/11/2016   HGB 13.6 02/11/2016   HCT 40.3 02/11/2016   MCV 90.4 02/11/2016   PLT 353 02/11/2016   Lab Results  Component Value Date   NA 138 10/28/2018   K 4.1 10/28/2018   CO2 25 10/28/2018   GLUCOSE 97 10/28/2018   BUN 20 10/28/2018   CREATININE 1.11 (H) 10/28/2018   BILITOT 0.8 02/18/2018   ALKPHOS 63 02/07/2017   AST 21 02/18/2018   ALT 17 02/18/2018   PROT 7.6 02/18/2018   ALBUMIN 4.3 02/07/2017   CALCIUM 10.1 10/28/2018   Lab Results  Component Value Date   CHOL 128 02/18/2018   Lab Results  Component Value Date   HDL 73 02/18/2018   Lab Results  Component Value Date   LDLCALC 38 02/18/2018   Lab Results  Component Value Date   TRIG 91 02/18/2018   Lab Results  Component Value Date   CHOLHDL 1.8 02/18/2018   Lab Results  Component Value Date   HGBA1C 6.7 (A) 10/28/2018       Assessment & Plan:   Problem List Items Addressed This Visit      Endocrine   Hyperlipidemia associated with type 2 diabetes mellitus ([place])   Relevant Orders   COMPLETE METABOLIC PANEL WITH GFR   Lipid panel   HgB A1c   Diabetes mellitus, type II (HCC) - Primary   Relevant Orders   COMPLETE METABOLIC PANEL WITH GFR   Lipid panel   HgB A1c  Genitourinary   KIDNEY DISEASE, CHRONIC, STAGE III   Relevant Orders   COMPLETE METABOLIC PANEL WITH GFR   Lipid panel   HgB A1c    Other Visit Diagnoses    Left ear pain       Impacted cerumen of left ear         Left ear pain/cerumen impaction-removed with irrigation and curettage.  Patient actually tolerated really well canal and eardrum looked great after irrigation.  Diabetes-she is due for hemoglobin A1c in about 2 weeks so we will go to have her go to the lab in early June after the state home restrictions are lifted.  CKD 3-due to recheck renal function in June as well.  Lab slip ordered today.  Hyperlipidemia-due to repeat lipid levels.  Urged her to go at her convenience.  No orders of the defined types were placed in this encounter.    Beatrice Lecher, MD

## 2019-01-13 NOTE — Progress Notes (Signed)
  left ear is blocked   Elouise Munroe, CMA

## 2019-01-13 NOTE — Progress Notes (Signed)
Acute Office Visit

## 2019-01-27 ENCOUNTER — Ambulatory Visit: Payer: PPO | Admitting: Family Medicine

## 2019-02-07 ENCOUNTER — Telehealth: Payer: Self-pay | Admitting: Family Medicine

## 2019-02-07 ENCOUNTER — Other Ambulatory Visit: Payer: Self-pay | Admitting: Family Medicine

## 2019-02-07 NOTE — Telephone Encounter (Signed)
Pt called. She has a DM check up coming up June 1st and wantsa lab order sent down the week of May 25th.  Thanks.

## 2019-02-07 NOTE — Telephone Encounter (Signed)
Pt already has labs in. She just needs to go have those done .Marland KitchenElouise Munroe, [place]

## 2019-02-07 NOTE — Telephone Encounter (Signed)
Pt aware.     Thank you

## 2019-02-12 DIAGNOSIS — N183 Chronic kidney disease, stage 3 (moderate): Secondary | ICD-10-CM | POA: Diagnosis not present

## 2019-02-12 DIAGNOSIS — E1169 Type 2 diabetes mellitus with other specified complication: Secondary | ICD-10-CM | POA: Diagnosis not present

## 2019-02-12 DIAGNOSIS — E785 Hyperlipidemia, unspecified: Secondary | ICD-10-CM | POA: Diagnosis not present

## 2019-02-12 DIAGNOSIS — E119 Type 2 diabetes mellitus without complications: Secondary | ICD-10-CM | POA: Diagnosis not present

## 2019-02-13 LAB — COMPLETE METABOLIC PANEL WITH GFR
AG Ratio: 1.4 (calc) (ref 1.0–2.5)
ALT: 17 U/L (ref 6–29)
AST: 20 U/L (ref 10–35)
Albumin: 4.3 g/dL (ref 3.6–5.1)
Alkaline phosphatase (APISO): 76 U/L (ref 37–153)
BUN/Creatinine Ratio: 26 (calc) — ABNORMAL HIGH (ref 6–22)
BUN: 29 mg/dL — ABNORMAL HIGH (ref 7–25)
CO2: 24 mmol/L (ref 20–32)
Calcium: 9.8 mg/dL (ref 8.6–10.4)
Chloride: 104 mmol/L (ref 98–110)
Creat: 1.11 mg/dL — ABNORMAL HIGH (ref 0.60–0.93)
GFR, Est African American: 57 mL/min/{1.73_m2} — ABNORMAL LOW (ref >=60)
GFR, Est Non African American: 50 mL/min/{1.73_m2} — ABNORMAL LOW (ref >=60)
Globulin: 3 g/dL (calc) (ref 1.9–3.7)
Glucose, Bld: 80 mg/dL (ref 65–99)
Potassium: 4.6 mmol/L (ref 3.5–5.3)
Sodium: 136 mmol/L (ref 135–146)
Total Bilirubin: 0.5 mg/dL (ref 0.2–1.2)
Total Protein: 7.3 g/dL (ref 6.1–8.1)

## 2019-02-13 LAB — LIPID PANEL
Cholesterol: 210 mg/dL — ABNORMAL HIGH (ref <200)
HDL: 69 mg/dL (ref >=50)
LDL Cholesterol (Calc): 104 mg/dL (calc) — ABNORMAL HIGH
Non-HDL Cholesterol (Calc): 141 mg/dL (calc) — ABNORMAL HIGH (ref <130)
Total CHOL/HDL Ratio: 3 (calc) (ref <5.0)
Triglycerides: 242 mg/dL — ABNORMAL HIGH (ref <150)

## 2019-02-13 LAB — HEMOGLOBIN A1C
Hgb A1c MFr Bld: 6.4 % of total Hgb — ABNORMAL HIGH (ref <5.7)
Mean Plasma Glucose: 137 (calc)
eAG (mmol/L): 7.6 (calc)

## 2019-02-17 ENCOUNTER — Encounter: Payer: Self-pay | Admitting: Family Medicine

## 2019-02-17 ENCOUNTER — Other Ambulatory Visit: Payer: Self-pay | Admitting: Family Medicine

## 2019-02-17 ENCOUNTER — Ambulatory Visit (INDEPENDENT_AMBULATORY_CARE_PROVIDER_SITE_OTHER): Payer: PPO | Admitting: Family Medicine

## 2019-02-17 VITALS — BP 113/57 | HR 76 | Ht 61.0 in | Wt 147.0 lb

## 2019-02-17 DIAGNOSIS — E119 Type 2 diabetes mellitus without complications: Secondary | ICD-10-CM | POA: Diagnosis not present

## 2019-02-17 DIAGNOSIS — E1169 Type 2 diabetes mellitus with other specified complication: Secondary | ICD-10-CM | POA: Diagnosis not present

## 2019-02-17 DIAGNOSIS — M7989 Other specified soft tissue disorders: Secondary | ICD-10-CM

## 2019-02-17 DIAGNOSIS — E785 Hyperlipidemia, unspecified: Secondary | ICD-10-CM | POA: Diagnosis not present

## 2019-02-17 MED ORDER — FUROSEMIDE 20 MG PO TABS: 20.0000 mg | ORAL_TABLET | Freq: Every day | ORAL | 3 refills | Status: DC | PRN

## 2019-02-17 NOTE — Progress Notes (Signed)
   Subjective:    CC: f/U DM   HPI:  Diabetes - no hypoglycemic events. No wounds or sores that are not healing well. No increased thirst or urination. Checking glucose at home. Taking medications as prescribed without any side effects.  She did go to the lab about a week ago on May 27 her A1c was 6.4, which is great as it is down from previous of 6.7.  Follow-up hyperlipidemia-LDL was just a little over 100.  It was 104 when it was checked about a week ago.  This was a significant drop compared to previous cholesterol levels from 12 months ago.  She also c/o of swelling of her left lateral ankle and left wrist.  No improving or worsening symptoms.  No known aggravating factors.  She says is been there for quite a while.  A coworker recently had mentioned that she should probably get it checked out so she came in.  She says she does not really get any pain or discomfort with it.  He seemed to get better in the morning and then worse later in the day.   Past medical history, Surgical history, Family history not pertinant except as noted below, Social history, Allergies, and medications have been entered into the medical record, reviewed, and corrections made.   Review of Systems: No fevers, chills, night sweats, weight loss, chest pain, or shortness of breath.   Objective:    Physical Exam Constitutional:      Appearance: She is well-developed.  HENT:     Head: Normocephalic and atraumatic.  Cardiovascular:     Rate and Rhythm: Normal rate and regular rhythm.     Heart sounds: Normal heart sounds.  Pulmonary:     Effort: Pulmonary effort is normal.     Breath sounds: Normal breath sounds.  Skin:    General: Skin is warm and dry.     Comments: The left wrist is noticeable larger than the wrist but not pitting edema.  On the left lateral ankle there is a fatty deposit but again no pitting edema.   Neurological:     Mental Status: She is alert and oriented to person, place, and time.   Psychiatric:        Behavior: Behavior normal.      Impression and Recommendations:    DM -she is on ACE inhibitor and a statin.  Well controlled.  Continue current regimen.  Follow-up in 4 months.  Hyperlipidemia-just encouraged her to make sure that she is taking taking her statin regularly.   Swelling - discussed options. I really think the swelling in her wrist is lymphedema and the area in her left ankle is a fatty deposit.  I did give her the same dose of a diuretic to try for couple days just to see if she feels like it helps but I do not think that it would help.  But she can certainly let me know.  I discussed the assessment and treatment plan with the patient. The patient was provided an opportunity to ask questions and all were answered. The patient agreed with the plan and demonstrated an understanding of the instructions.    Beatrice Lecher, MD

## 2019-02-27 ENCOUNTER — Telehealth: Payer: Self-pay | Admitting: Family Medicine

## 2019-02-27 NOTE — Telephone Encounter (Signed)
Please call patient: I see that she has not filled her statin in the most a year.  I would highly encourage her to take it.  Her ten-year risk for cardiovascular disease is about 22%.  Which is pretty significant that is about 1 out of 5 chance of developing heart disease and or stroke in the next 10 years.  Even if she is not able to take it every day she can try taking it every other day.  Please let me know what she would like to do and if she is at least willing to try taking it every other day.  If there is a cost issue or something else going on then please let me know.

## 2019-02-27 NOTE — Telephone Encounter (Signed)
Called patient and was unable to leave a message

## 2019-03-03 MED ORDER — ROSUVASTATIN CALCIUM 20 MG PO TABS: 20.0000 mg | ORAL_TABLET | Freq: Every day | ORAL | 1 refills | Status: DC

## 2019-03-03 NOTE — Telephone Encounter (Signed)
Called and left pt msg to call back concerning medication

## 2019-03-03 NOTE — Telephone Encounter (Signed)
Patient called back, she didn't realize she had forgotten to refill it and has not been taking it. OK with taking it daily.   Per pt request, new RX sent to pharmacy so she can restart. No further needs at this time.

## 2019-04-02 ENCOUNTER — Other Ambulatory Visit: Payer: Self-pay | Admitting: Family Medicine

## 2019-04-04 ENCOUNTER — Telehealth: Payer: Self-pay

## 2019-04-04 DIAGNOSIS — E119 Type 2 diabetes mellitus without complications: Secondary | ICD-10-CM

## 2019-04-04 NOTE — Telephone Encounter (Signed)
Tyanna called and states the Januvia cost $200. I have placed the formulary on Dr Gardiner Ramus desk.

## 2019-04-07 MED ORDER — GLIPIZIDE 5 MG PO TABS: 5.0000 mg | ORAL_TABLET | Freq: Two times a day (BID) | ORAL | 1 refills | Status: DC

## 2019-04-07 NOTE — Telephone Encounter (Signed)
Patient advised.

## 2019-04-07 NOTE — Telephone Encounter (Signed)
Ok WILL CHANGE TO GLIPIZIDE.  NEW RX SENT.

## 2019-04-28 ENCOUNTER — Encounter: Payer: Self-pay | Admitting: Family Medicine

## 2019-04-28 ENCOUNTER — Ambulatory Visit (INDEPENDENT_AMBULATORY_CARE_PROVIDER_SITE_OTHER): Payer: Medicare Other | Admitting: Family Medicine

## 2019-04-28 VITALS — Temp 97.4°F | Ht 61.0 in

## 2019-04-28 DIAGNOSIS — E119 Type 2 diabetes mellitus without complications: Secondary | ICD-10-CM

## 2019-04-28 NOTE — Progress Notes (Signed)
Virtual Visit via Telephone Note  I connected with Krystyna Cleckley Makin on 04/28/19 at 10:10 AM EDT by a video enabled telemedicine application and verified that I am speaking with the correct person using two identifiers.   I discussed the limitations of evaluation and management by telemedicine and the availability of in person appointments. The patient expressed understanding and agreed to proceed.  Subjective:    CC: F/U DM  HPI: F/U DM - She reports that her BS have been doing well since starting the new medication. We recently changed her to glipizide bc of cost reason.  The Januvia was too costly.  Weight has been stable.  She says she has gained a couple of pounds.  Pt c/o R hip pain and stated that she will make a f/u for an injection. She has been using an OTC pain rub and this works sometimes for her.  She says is mostly on the outside of the hip but she has been able to sleep on that side.  She is been using icy hot and says that really provides a lot of relief for her and seems to be very effective.  In regards to her ankle swelling she actually feels like the furosemide has been helpful.  I felt the swelling was likely due to some lymphedema but she actually has noticed some improvement.   Past medical history, Surgical history, Family history not pertinant except as noted below, Social history, Allergies, and medications have been entered into the medical record, reviewed, and corrections made.   Review of Systems: No fevers, chills, night sweats, weight loss, chest pain, or shortness of breath.   Objective:    General: Speaking clearly in complete sentences without any shortness of breath.  Alert and oriented x3.  Normal judgment. No apparent acute distress.    Impression and Recommendations:    DM -sounds like she is doing fantastic on the glipizide.  Fasting glucose 1 morning was 96 which was normal.  Plan to keep appointment in October and will just do her next A1c at  that point.  Left ankle swelling-seems to be improved with furosemide though I still think she has some element of lymphedema.  Will consider future referral to lymphedema clinic if needed.  Right hip pain -possible bursitis versus right hip osteoarthritis-continue with the IcyHot since that really seems to help.  Work on range of motion and if not improved I will be happy to address further in October.    Reminded her to schedule her mammogram.  Time spent 22 min in non-face to face encounter.   I discussed the assessment and treatment plan with the patient. The patient was provided an opportunity to ask questions and all were answered. The patient agreed with the plan and demonstrated an understanding of the instructions.   The patient was advised to call back or seek an in-person evaluation if the symptoms worsen or if the condition fails to improve as anticipated.   Beatrice Lecher, MD

## 2019-04-28 NOTE — Progress Notes (Signed)
She reports that her BS have been doing well since starting the new medication.   Pt c/o R hip pain and stated that she will make a f/u for an injection. She has been using an OTC pain rub and this works sometimes for her.   Maryruth Eve, Lahoma Crocker, CMA

## 2019-05-05 ENCOUNTER — Other Ambulatory Visit: Payer: Self-pay | Admitting: Family Medicine

## 2019-05-05 DIAGNOSIS — Z1231 Encounter for screening mammogram for malignant neoplasm of breast: Secondary | ICD-10-CM

## 2019-05-29 ENCOUNTER — Ambulatory Visit (INDEPENDENT_AMBULATORY_CARE_PROVIDER_SITE_OTHER): Payer: Medicare Other

## 2019-05-29 ENCOUNTER — Other Ambulatory Visit: Payer: Self-pay

## 2019-05-29 DIAGNOSIS — Z1231 Encounter for screening mammogram for malignant neoplasm of breast: Secondary | ICD-10-CM

## 2019-06-23 ENCOUNTER — Other Ambulatory Visit: Payer: Self-pay

## 2019-06-23 ENCOUNTER — Ambulatory Visit (INDEPENDENT_AMBULATORY_CARE_PROVIDER_SITE_OTHER): Payer: Medicare Other | Admitting: Family Medicine

## 2019-06-23 ENCOUNTER — Encounter: Payer: Self-pay | Admitting: Family Medicine

## 2019-06-23 VITALS — BP 116/53 | HR 75 | Ht 61.0 in | Wt 153.0 lb

## 2019-06-23 DIAGNOSIS — Z23 Encounter for immunization: Secondary | ICD-10-CM

## 2019-06-23 DIAGNOSIS — N1831 Chronic kidney disease, stage 3a: Secondary | ICD-10-CM

## 2019-06-23 DIAGNOSIS — E119 Type 2 diabetes mellitus without complications: Secondary | ICD-10-CM

## 2019-06-23 LAB — POCT GLYCOSYLATED HEMOGLOBIN (HGB A1C): Hemoglobin A1C: 6.3 % — AB (ref 4.0–5.6)

## 2019-06-23 MED ORDER — LISINOPRIL 2.5 MG PO TABS: 2.5000 mg | ORAL_TABLET | Freq: Every day | ORAL | 3 refills | Status: DC

## 2019-06-23 MED ORDER — INVOKAMET XR 150-1000 MG PO TB24: 1.0000 | ORAL_TABLET | Freq: Every day | ORAL | 1 refills | Status: DC

## 2019-06-23 NOTE — Progress Notes (Signed)
Established Patient Office Visit  Subjective:  Patient ID: Hannah Rojas, female    DOB: 01/03/1946  Age: 73 y.o. MRN: [id number]  CC:  Chief Complaint  Patient presents with  . Diabetes    HPI [name] presents for follow-up A1C and peripheral edema.   DM - She reports that she has experienced a lot of weight gain since switching to glipizide, she would like to discuss other medications. She checks her sugars every morning, states they are "up and down" and range from 110-130. She admits to having trouble with staying away from sugars and that she needs to work on exercising more.  Denies HA, CP, palpitations, skin breakdown. Admits to feeling light-headed when she is at work and has not eaten - resolves when she eats her lunch.   Edema - States that she is still experiencing swelling in her lower extremities, does not feel like the lasix has improved her symptoms since last visit. It does not cause her pain, she doesn't think that it is all that bothersome.   Past Medical History:  Diagnosis Date  . Cataracts, bilateral    bilateral  . H. pylori infection    history  . Pancreatic cyst    hx mucinous cystoadenoma    Past Surgical History:  Procedure Laterality Date  . CHOLECYSTECTOMY    . ESOPHAGOGASTRODUODENOSCOPY    . PANCREATIC CYST EXCISION    . spleenectomy      History reviewed. No pertinent family history.  Social History   Socioeconomic History  . Marital status: Widowed    Spouse name: Not on file  . Number of children: Not on file  . Years of education: Not on file  . Highest education level: Not on file  Occupational History  . Not on file  Social Needs  . Financial resource strain: Not on file  . Food insecurity    Worry: Not on file    Inability: Not on file  . Transportation needs    Medical: Not on file    Non-medical: Not on file  Tobacco Use  . Smoking status: Never Smoker  . Smokeless tobacco: Never Used  Substance and  Sexual Activity  . Alcohol use: No  . Drug use: Not on file  . Sexual activity: Not on file    Comment: works at [employer]  . Physical activity    Days per week: Not on file    Minutes per session: Not on file  . Stress: Not on file  Relationships  . Social Herbalist on phone: Not on file    Gets together: Not on file    Attends religious service: Not on file    Active member of club or organization: Not on file    Attends meetings of clubs or organizations: Not on file    Relationship status: Not on file  . Intimate partner violence    Fear of current or ex partner: Not on file    Emotionally abused: Not on file    Physically abused: Not on file    Forced sexual activity: Not on file  Other Topics Concern  . Not on file  Social History Narrative  . Not on file    Outpatient Medications Prior to Visit  Medication Sig Dispense Refill  . Acetaminophen (TYLENOL 8 HOUR ARTHRITIS PAIN PO) Take 1 tablet by mouth at bedtime as needed.    . blood glucose meter kit and supplies Dispense  based on patient and insurance preference. Use up to four times daily as directed. Dx:E11.9 1 each 0  . ONE TOUCH ULTRA TEST test strip USE 1 STRIP TO CHECK GLUCOSE UP TO 4 TIMES DAILY AS DIRECTED. 300 each 11  . rosuvastatin (CRESTOR) 20 MG tablet Take 1 tablet (20 mg total) by mouth at bedtime. 90 tablet 1  . glipiZIDE (GLUCOTROL) 5 MG tablet Take 1 tablet (5 mg total) by mouth 2 (two) times daily before a meal. 180 tablet 1  . INVOKAMET XR 50-1000 MG TB24 Take 1 tablet by mouth once daily 30 tablet 6  . lisinopril (ZESTRIL) 2.5 MG tablet Take 1 tablet by mouth once daily 90 tablet 0  . doxepin (SINEQUAN) 10 MG capsule TAKE 1 CAPSULE BY MOUTH AT BEDTIME AS NEEDED FOR SLEEP 30 capsule 1  . furosemide (LASIX) 20 MG tablet Take 1 tablet (20 mg total) by mouth daily as needed for edema. 20 tablet 3   No facility-administered medications prior to visit.     Allergies  Allergen  Reactions  . Ibuprofen Rash  . Iodinated Diagnostic Agents Rash    IV Contrast  . Sulfa Antibiotics Rash  . Atorvastatin Other (See Comments)    HA  . Naproxen Sodium   . Pravastatin Other (See Comments)    dizziness  . Simvastatin Other (See Comments)    Nightmares  . Sulfonamide Derivatives     REACTION: rash  . Trazodone And Nefazodone Other (See Comments)    Nightmares     ROS Review of Systems  Constitutional: Negative for chills and fever.  Eyes:       Negative for vision changes.   Respiratory: Negative for shortness of breath.   Cardiovascular: Positive for leg swelling (see HPI. ). Negative for chest pain and palpitations.  Skin:       Denies skin breakdown, ulcer formation.        Objective:    Physical Exam  Constitutional: She is oriented to person, place, and time. She appears well-developed and well-nourished.  HENT:  Head: Normocephalic and atraumatic.  Eyes: Pupils are equal, round, and reactive to light. Conjunctivae and EOM are normal.  Cardiovascular: Normal rate and regular rhythm.  Pulmonary/Chest: Effort normal and breath sounds normal. No respiratory distress.  Musculoskeletal:        General: Edema (bilateral lower extremities) present.  Neurological: She is oriented to person, place, and time.  Skin: Skin is warm and dry.  Psychiatric: She has a normal mood and affect. Her behavior is normal.   .. Results for orders placed or performed in visit on 06/23/19  POCT glycosylated hemoglobin (Hb A1C)  Result Value Ref Range   Hemoglobin A1C 6.3 (A) 4.0 - 5.6 %   HbA1c POC (<> result, manual entry)     HbA1c, POC (prediabetic range)     HbA1c, POC (controlled diabetic range)      BP (!) 116/53   Pulse 75   Ht 5' 1"  (1.549 m)   Wt 153 lb (69.4 kg)   SpO2 100%   BMI 28.91 kg/m  Wt Readings from Last 3 Encounters:  06/23/19 153 lb (69.4 kg)  02/17/19 147 lb (66.7 kg)  01/13/19 148 lb (67.1 kg)     There are no preventive care  reminders to display for this patient.  There are no preventive care reminders to display for this patient.  Lab Results  Component Value Date   TSH 1.940 03/04/2012   Lab Results  Component Value Date   WBC 11.8 (H) 02/11/2016   HGB 13.6 02/11/2016   HCT 40.3 02/11/2016   MCV 90.4 02/11/2016   PLT 353 02/11/2016   Lab Results  Component Value Date   NA 136 02/12/2019   K 4.6 02/12/2019   CO2 24 02/12/2019   GLUCOSE 80 02/12/2019   BUN 29 (H) 02/12/2019   CREATININE 1.11 (H) 02/12/2019   BILITOT 0.5 02/12/2019   ALKPHOS 63 02/07/2017   AST 20 02/12/2019   ALT 17 02/12/2019   PROT 7.3 02/12/2019   ALBUMIN 4.3 02/07/2017   CALCIUM 9.8 02/12/2019   Lab Results  Component Value Date   CHOL 210 (H) 02/12/2019   Lab Results  Component Value Date   HDL 69 02/12/2019   Lab Results  Component Value Date   LDLCALC 104 (H) 02/12/2019   Lab Results  Component Value Date   TRIG 242 (H) 02/12/2019   Lab Results  Component Value Date   CHOLHDL 3.0 02/12/2019   Lab Results  Component Value Date   HGBA1C 6.3 (A) 06/23/2019      Assessment & Plan:   Marland KitchenMarland KitchenSylvana was seen today for diabetes.  Diagnoses and all orders for this visit:  Type 2 diabetes mellitus without complication, without long-term current use of insulin (HCC) -     POCT glycosylated hemoglobin (Hb A1C)  Need for immunization against influenza -     Flu Vaccine QUAD High Dose(Fluad)  Stage 3a chronic kidney disease  Other orders -     Canagliflozin-metFORMIN HCl ER (INVOKAMET XR) [phone] MG TB24; Take 1 tablet by mouth daily. -     lisinopril (ZESTRIL) 2.5 MG tablet; Take 1 tablet (2.5 mg total) by mouth daily.   Discontinuing the glipizide due to weight gain. Increased Invokamet dosage. Patient will work on exercise and diet. If A1C is not controlled in 3 months, will need to discuss adding an additional medicine. Patient seemed determined to lose the weight and was interested in discussing  options like ozempic at next visit if needed.   Edema is likely lymphedema. Advised patient that treatment would include compression stockings and seeing a lymphedema specialist. Patient states that it is not painful or bothersome to her at this time, so she will just continue with the lasix.   Follow-up: 3 months.     Beatrice Lecher, MD

## 2019-06-23 NOTE — Assessment & Plan Note (Signed)
Continue to monitor Q 6 months.

## 2019-09-01 ENCOUNTER — Telehealth: Payer: Self-pay

## 2019-09-01 NOTE — Telephone Encounter (Signed)
I would encourage her to decrease her bread intake.  It sounds like she is having bread with most meals whether it is part of the same which.  Okay for toast in the morning but no more than 1 piece.  She needs to have some type of protein with that such as an egg.  Okay for her sandwich at lunchtime.  And for dinner just avoid bread and try to keep carbs low.  Okay to eat the protein.  If she does have the burger with a bread then she definitely should not eat chips she should only be eating vegetables with that.  Also encouraged her to really increase her vegetable intake.

## 2019-09-01 NOTE — Telephone Encounter (Signed)
Hannah Rojas called and reported elevated fasting glucose.   199 mg/dl 157 mg/dl 144 mg/dl 138 mg/dl  I did ask what she eats in a normal day.  Breakfast - Toast Lunch - Sub Sandwich Dinner - Burger and chips

## 2019-09-02 NOTE — Telephone Encounter (Signed)
Patient advised of recommendations.  

## 2019-09-10 ENCOUNTER — Other Ambulatory Visit: Payer: Self-pay

## 2019-09-10 NOTE — Patient Outreach (Signed)
[facility]) Care Management  09/10/2019  SHENICKA CURBY [birth year] [id number]   Medication Adherence call to Mrs. [place] Compliant Voice message left with a call back number. Mrs. Leavelle is showing past due on Rosuvastatin 20 mg under [place].   [place] Management Direct Dial [phone]  Fax [phone] [email or web address]

## 2019-09-15 ENCOUNTER — Other Ambulatory Visit: Payer: Self-pay

## 2019-09-15 ENCOUNTER — Other Ambulatory Visit: Payer: Self-pay | Admitting: Family Medicine

## 2019-09-15 NOTE — Patient Outreach (Signed)
[facility]) Care Management  09/15/2019  Hannah Rojas 1946/04/01 [id number]   Medication Adherence call back from Hannah Rojas [facility] Identifiers Verify spoke with patient she is showing past due on Rosuvastatin 5 mg,patient explain she takes 1 tablet daily and has 4 more tablets,patient ask if we can call [facility] to order this medication,[facility] will have it ready for patient to pick up.patient also had a question on her Invokana Xr 150/1000 mg patient ask if there was any assistance with this medication she explain she pays $117 dollars every 3 month she explain this was too expensive for her,patient will be refer to Margie Ege for father Arnell Sieving on Patient Assistance. HannahPfefferle is showing past due under [facility] ins.  [facility] Management Direct Dial [phone]  Fax [phone] [email or web address]

## 2019-09-22 ENCOUNTER — Other Ambulatory Visit: Payer: Self-pay

## 2019-09-22 ENCOUNTER — Encounter: Payer: Self-pay | Admitting: Family Medicine

## 2019-09-22 ENCOUNTER — Ambulatory Visit (INDEPENDENT_AMBULATORY_CARE_PROVIDER_SITE_OTHER): Payer: Medicare Other | Admitting: Family Medicine

## 2019-09-22 VITALS — Ht 61.0 in

## 2019-09-22 DIAGNOSIS — N1831 Chronic kidney disease, stage 3a: Secondary | ICD-10-CM

## 2019-09-22 DIAGNOSIS — E119 Type 2 diabetes mellitus without complications: Secondary | ICD-10-CM

## 2019-09-22 LAB — POCT GLYCOSYLATED HEMOGLOBIN (HGB A1C): Hemoglobin A1C: 7.5 % — AB (ref 4.0–5.6)

## 2019-09-22 MED ORDER — LEVEMIR FLEXTOUCH 100 UNIT/ML ~~LOC~~ SOPN: 10.0000 [IU] | PEN_INJECTOR | Freq: Every day | SUBCUTANEOUS | 3 refills | Status: DC

## 2019-09-22 NOTE — Patient Instructions (Signed)
Start with 10 units of insulin at bedtime once a day.  Check your sugar first thing in the morning.  Continue with 10 units every day for 1 week.  After 1 week if most of your sugars are above 130 then increase it to 12 units/day.  Again do that for 1 week and then look back at those sugars.  If most of them are greater than 130 then increase to 14 units.  Continue to increase by 2 units every week if needed.  Once her blood sugars look relatively well controlled in the mornings, most sugars are under 130, then just stay at that same dose until I see you back.

## 2019-09-22 NOTE — Progress Notes (Signed)
Established Patient Office Visit  Subjective:  Patient ID: Hannah Rojas, female    DOB: 02/16/1946  Age: 74 y.o. MRN: [id number]  CC:  Chief Complaint  Patient presents with  . Diabetes    HPI Hannah Rojas presents for   Diabetes - no hypoglycemic events. No wounds or sores that are not healing well. No increased thirst or urination. Checking glucose at home. Taking medications as prescribed without any side effects.  She recently started doing Slim fast for meal replacement for breakfast.  We also recently took her off the glipizide because of weight gain.  She is actually lost 2 pounds since she was last here, which is fantastic and she is also trying to really cut back on bread intake which she had been eating a fair amount of in fact she was eating bread with most every meal.  F/U CKD 3 - No changes in urination, etc.     Past Medical History:  Diagnosis Date  . Cataracts, bilateral    bilateral  . H. pylori infection    history  . Pancreatic cyst    hx mucinous cystoadenoma    Past Surgical History:  Procedure Laterality Date  . CHOLECYSTECTOMY    . ESOPHAGOGASTRODUODENOSCOPY    . PANCREATIC CYST EXCISION    . spleenectomy      No family history on file.  Social History   Socioeconomic History  . Marital status: Widowed    Spouse name: Not on file  . Number of children: Not on file  . Years of education: Not on file  . Highest education level: Not on file  Occupational History  . Not on file  Tobacco Use  . Smoking status: Never Smoker  . Smokeless tobacco: Never Used  Substance and Sexual Activity  . Alcohol use: No  . Drug use: Not on file  . Sexual activity: Not on file    Comment: works at [employer]  Other Topics Concern  . Not on file  Social History Narrative  . Not on file   Social Determinants of Health   Financial Resource Strain:   . Difficulty of Paying Living Expenses: Not on file  Food Insecurity:   . Worried About  Charity fundraiser in the Last Year: Not on file  . Ran Out of Food in the Last Year: Not on file  Transportation Needs:   . Lack of Transportation (Medical): Not on file  . Lack of Transportation (Non-Medical): Not on file  Physical Activity:   . Days of Exercise per Week: Not on file  . Minutes of Exercise per Session: Not on file  Stress:   . Feeling of Stress : Not on file  Social Connections:   . Frequency of Communication with Friends and Family: Not on file  . Frequency of Social Gatherings with Friends and Family: Not on file  . Attends Religious Services: Not on file  . Active Member of Clubs or Organizations: Not on file  . Attends Archivist Meetings: Not on file  . Marital Status: Not on file  Intimate Partner Violence:   . Fear of Current or Ex-Partner: Not on file  . Emotionally Abused: Not on file  . Physically Abused: Not on file  . Sexually Abused: Not on file    Outpatient Medications Prior to Visit  Medication Sig Dispense Refill  . Acetaminophen (TYLENOL 8 HOUR ARTHRITIS PAIN PO) Take 1 tablet by mouth at bedtime as needed.    Marland Kitchen  blood glucose meter kit and supplies Dispense based on patient and insurance preference. Use up to four times daily as directed. Dx:E11.9 1 each 0  . Canagliflozin-metFORMIN HCl ER (INVOKAMET XR) [id number] MG TB24 Take 1 tablet by mouth daily. 90 tablet 1  . lisinopril (ZESTRIL) 2.5 MG tablet Take 1 tablet (2.5 mg total) by mouth daily. 90 tablet 3  . ONE TOUCH ULTRA TEST test strip USE 1 STRIP TO CHECK GLUCOSE UP TO 4 TIMES DAILY AS DIRECTED. 300 each 11  . rosuvastatin (CRESTOR) 20 MG tablet TAKE 1 TABLET BY MOUTH AT BEDTIME 90 tablet 0   No facility-administered medications prior to visit.    Allergies  Allergen Reactions  . Ibuprofen Rash  . Iodinated Diagnostic Agents Rash    IV Contrast  . Sulfa Antibiotics Rash  . Atorvastatin Other (See Comments)    HA  . Naproxen Sodium   . Pravastatin Other (See Comments)     dizziness  . Simvastatin Other (See Comments)    Nightmares  . Sulfonamide Derivatives     REACTION: rash  . Trazodone And Nefazodone Other (See Comments)    Nightmares     ROS Review of Systems    Objective:    Physical Exam  Ht 5' 1"  (1.549 m)   BMI 28.91 kg/m  Wt Readings from Last 3 Encounters:  06/23/19 153 lb (69.4 kg)  02/17/19 147 lb (66.7 kg)  01/13/19 148 lb (67.1 kg)     Health Maintenance Due  Topic Date Due  . OPHTHALMOLOGY EXAM  08/11/2019    There are no preventive care reminders to display for this patient.  Lab Results  Component Value Date   TSH 1.940 03/04/2012   Lab Results  Component Value Date   WBC 11.8 (H) 02/11/2016   HGB 13.6 02/11/2016   HCT 40.3 02/11/2016   MCV 90.4 02/11/2016   PLT 353 02/11/2016   Lab Results  Component Value Date   NA 136 02/12/2019   K 4.6 02/12/2019   CO2 24 02/12/2019   GLUCOSE 80 02/12/2019   BUN 29 (H) 02/12/2019   CREATININE 1.11 (H) 02/12/2019   BILITOT 0.5 02/12/2019   ALKPHOS 63 02/07/2017   AST 20 02/12/2019   ALT 17 02/12/2019   PROT 7.3 02/12/2019   ALBUMIN 4.3 02/07/2017   CALCIUM 9.8 02/12/2019   Lab Results  Component Value Date   CHOL 210 (H) 02/12/2019   Lab Results  Component Value Date   HDL 69 02/12/2019   Lab Results  Component Value Date   LDLCALC 104 (H) 02/12/2019   Lab Results  Component Value Date   TRIG 242 (H) 02/12/2019   Lab Results  Component Value Date   CHOLHDL 3.0 02/12/2019   Lab Results  Component Value Date   HGBA1C 7.5 (A) 09/22/2019      Assessment & Plan:   Problem List Items Addressed This Visit      Endocrine   Diabetes mellitus, type II ([place]) - Primary    Uncontrolled since coming off the glipizide.  But her weight is down 2 pounds which is great which was partly the reason we had stopped the glipizide she had noticed significant weight gain since being on it.  So discussed starting long-acting bedtime insulin start with 10 units  of insulin at bedtime once a day.  Check your sugar first thing in the morning.  Continue with 10 units every day for 1 week.  After 1 week if most of  your sugars are above 130 then increase it to 12 units/day.  Again do that for 1 week and then look back at those sugars.  If most of them are greater than 130 then increase to 14 units.  Continue to increase by 2 units every week if needed.  Once her blood sugars look relatively well controlled in the mornings, most sugars are under 130, then just stay at that same dose until I see you back.      Relevant Medications   Insulin Detemir (LEVEMIR FLEXTOUCH) 100 UNIT/ML Pen   Other Relevant Orders   POCT HgB A1C (Completed)     Genitourinary   KIDNEY DISEASE, CHRONIC, STAGE III    Well controlled. Continue current regimen. Follow up in  6 months.        Relevant Orders   BMP w/ GFR     Reminded to schedule up-to-date eye exam.  Meds ordered this encounter  Medications  . Insulin Detemir (LEVEMIR FLEXTOUCH) 100 UNIT/ML Pen    Sig: Inject 10-20 Units into the skin at bedtime.    Dispense:  15 mL    Refill:  3    Follow-up: Return in about 3 months (around 12/21/2019) for Diabetes follow-up.    Beatrice Lecher, MD

## 2019-09-22 NOTE — Assessment & Plan Note (Signed)
Well controlled. Continue current regimen. Follow up in  6 months.  

## 2019-09-22 NOTE — Assessment & Plan Note (Addendum)
Uncontrolled since coming off the glipizide.  But her weight is down 2 pounds which is great which was partly the reason we had stopped the glipizide she had noticed significant weight gain since being on it.  So discussed starting long-acting bedtime insulin start with 10 units of insulin at bedtime once a day.  Check your sugar first thing in the morning.  Continue with 10 units every day for 1 week.  After 1 week if most of your sugars are above 130 then increase it to 12 units/day.  Again do that for 1 week and then look back at those sugars.  If most of them are greater than 130 then increase to 14 units.  Continue to increase by 2 units every week if needed.  Once her blood sugars look relatively well controlled in the mornings, most sugars are under 130, then just stay at that same dose until I see you back.

## 2019-09-24 ENCOUNTER — Other Ambulatory Visit: Payer: Self-pay | Admitting: Family Medicine

## 2019-09-24 DIAGNOSIS — E119 Type 2 diabetes mellitus without complications: Secondary | ICD-10-CM

## 2019-09-24 NOTE — Telephone Encounter (Signed)
Call patient received letter from her insurance letting us know that her invoke a met will no longer be covered but they will cover Synjardy or Xigduo both of which are very similar and will be more cost effective. If she is okay with [place] making a change then please let me know and I will be happy to send over a new medication.

## 2019-09-25 MED ORDER — XIGDUO XR 10-1000 MG PO TB24: 1.0000 | ORAL_TABLET | Freq: Every day | ORAL | 1 refills | Status: DC

## 2019-09-25 NOTE — Telephone Encounter (Signed)
Called and advised pt of recommendations and she is fine with the change.  Told her to finish what she already has before she starts new medication. She voiced understanding.Marland KitchenMarland KitchenElouise Rojas, [place]

## 2019-09-29 DIAGNOSIS — N1831 Chronic kidney disease, stage 3a: Secondary | ICD-10-CM | POA: Diagnosis not present

## 2019-09-30 LAB — BASIC METABOLIC PANEL WITH GFR
BUN/Creatinine Ratio: 25 (calc) — ABNORMAL HIGH (ref 6–22)
BUN: 28 mg/dL — ABNORMAL HIGH (ref 7–25)
CO2: 27 mmol/L (ref 20–32)
Calcium: 10.5 mg/dL — ABNORMAL HIGH (ref 8.6–10.4)
Chloride: 105 mmol/L (ref 98–110)
Creat: 1.1 mg/dL — ABNORMAL HIGH (ref 0.60–0.93)
GFR, Est African American: 58 mL/min/{1.73_m2} — ABNORMAL LOW (ref >=60)
GFR, Est Non African American: 50 mL/min/{1.73_m2} — ABNORMAL LOW (ref >=60)
Glucose, Bld: 135 mg/dL — ABNORMAL HIGH (ref 65–99)
Potassium: 4.7 mmol/L (ref 3.5–5.3)
Sodium: 139 mmol/L (ref 135–146)

## 2019-12-01 LAB — HM DIABETES EYE EXAM

## 2019-12-01 LAB — HEMOGLOBIN A1C: Hemoglobin A1C: 7.3

## 2019-12-16 ENCOUNTER — Encounter: Payer: Self-pay | Admitting: Family Medicine

## 2019-12-18 ENCOUNTER — Other Ambulatory Visit: Payer: Self-pay | Admitting: Family Medicine

## 2019-12-22 ENCOUNTER — Other Ambulatory Visit: Payer: Self-pay

## 2019-12-22 ENCOUNTER — Telehealth (INDEPENDENT_AMBULATORY_CARE_PROVIDER_SITE_OTHER): Payer: Medicare Other | Admitting: Family Medicine

## 2019-12-22 ENCOUNTER — Encounter: Payer: Self-pay | Admitting: Family Medicine

## 2019-12-22 VITALS — BP 102/58 | HR 75 | Ht 61.0 in | Wt 152.0 lb

## 2019-12-22 DIAGNOSIS — E1169 Type 2 diabetes mellitus with other specified complication: Secondary | ICD-10-CM | POA: Diagnosis not present

## 2019-12-22 DIAGNOSIS — E119 Type 2 diabetes mellitus without complications: Secondary | ICD-10-CM | POA: Diagnosis not present

## 2019-12-22 DIAGNOSIS — N1831 Chronic kidney disease, stage 3a: Secondary | ICD-10-CM | POA: Diagnosis not present

## 2019-12-22 DIAGNOSIS — E785 Hyperlipidemia, unspecified: Secondary | ICD-10-CM | POA: Diagnosis not present

## 2019-12-22 DIAGNOSIS — Z780 Asymptomatic menopausal state: Secondary | ICD-10-CM

## 2019-12-22 LAB — COMPLETE METABOLIC PANEL WITH GFR
AG Ratio: 1.4 (calc) (ref 1.0–2.5)
ALT: 17 U/L (ref 6–29)
AST: 21 U/L (ref 10–35)
Albumin: 4.6 g/dL (ref 3.6–5.1)
Alkaline phosphatase (APISO): 67 U/L (ref 37–153)
BUN/Creatinine Ratio: 29 (calc) — ABNORMAL HIGH (ref 6–22)
BUN: 30 mg/dL — ABNORMAL HIGH (ref 7–25)
CO2: 30 mmol/L (ref 20–32)
Calcium: 10.5 mg/dL — ABNORMAL HIGH (ref 8.6–10.4)
Chloride: 106 mmol/L (ref 98–110)
Creat: 1.05 mg/dL — ABNORMAL HIGH (ref 0.60–0.93)
GFR, Est African American: 61 mL/min/{1.73_m2} (ref >=60)
GFR, Est Non African American: 53 mL/min/{1.73_m2} — ABNORMAL LOW (ref >=60)
Globulin: 3.2 g/dL (calc) (ref 1.9–3.7)
Glucose, Bld: 100 mg/dL — ABNORMAL HIGH (ref 65–99)
Potassium: 5 mmol/L (ref 3.5–5.3)
Sodium: 142 mmol/L (ref 135–146)
Total Bilirubin: 0.9 mg/dL (ref 0.2–1.2)
Total Protein: 7.8 g/dL (ref 6.1–8.1)

## 2019-12-22 LAB — LIPID PANEL
Cholesterol: 157 mg/dL (ref <200)
HDL: 74 mg/dL (ref >=50)
LDL Cholesterol (Calc): 65 mg/dL (calc)
Non-HDL Cholesterol (Calc): 83 mg/dL (calc) (ref <130)
Total CHOL/HDL Ratio: 2.1 (calc) (ref <5.0)
Triglycerides: 93 mg/dL (ref <150)

## 2019-12-22 LAB — POCT GLYCOSYLATED HEMOGLOBIN (HGB A1C): Hemoglobin A1C: 6.7 % — AB (ref 4.0–5.6)

## 2019-12-22 NOTE — Assessment & Plan Note (Signed)
Due to recheck kidney function

## 2019-12-22 NOTE — Patient Instructions (Signed)
Ok to decrease your shot to 8 units a day.

## 2019-12-22 NOTE — Assessment & Plan Note (Addendum)
Well controlled. Continue current regimen. Follow up in  3 months.  It ask about aspirin as well and we did have discussion about that.  With no history of CAD we will hold off for now. Lab Results  Component Value Date   HGBA1C 6.7 (A) 12/22/2019

## 2019-12-22 NOTE — Progress Notes (Signed)
Established Patient Office Visit  Subjective:  Patient ID: Hannah Rojas, female    DOB: 1946-05-25  Age: 74 y.o. MRN: [id number]  CC:  Chief Complaint  Patient presents with  . Diabetes    HPI Hannah Rojas presents for  Diabetes - no hypoglycemic events. No wounds or sores that are not healing well. No increased thirst or urination. Checking glucose at home. Taking medications as prescribed without any side effects. On ACE and statin.  Reports home blood sugars have been really well controlled.  Hyperlipidemia - tolerating stating well with no myalgias or significant side effects.  Lab Results  Component Value Date   CHOL 210 (H) 02/12/2019   HDL 69 02/12/2019   LDLCALC 104 (H) 02/12/2019   LDLDIRECT 107 (H) 05/27/2014   TRIG 242 (H) 02/12/2019   CHOLHDL 3.0 02/12/2019   She also brought a form from [place] from her insurance provider.  She did have a urinalysis which showed 4+ glucose.Marland Kitchen  Her blood pressure was 110/80.  They did recommend following up for bone density and breast cancer screening.     Past Medical History:  Diagnosis Date  . Cataracts, bilateral    bilateral  . H. pylori infection    history  . Pancreatic cyst    hx mucinous cystoadenoma    Past Surgical History:  Procedure Laterality Date  . CHOLECYSTECTOMY    . ESOPHAGOGASTRODUODENOSCOPY    . PANCREATIC CYST EXCISION    . spleenectomy      No family history on file.  Social History   Socioeconomic History  . Marital status: Widowed    Spouse name: Not on file  . Number of children: Not on file  . Years of education: Not on file  . Highest education level: Not on file  Occupational History  . Not on file  Tobacco Use  . Smoking status: Never Smoker  . Smokeless tobacco: Never Used  Substance and Sexual Activity  . Alcohol use: No  . Drug use: Not on file  . Sexual activity: Not on file    Comment: works at [employer]  Other Topics Concern  . Not on file   Social History Narrative  . Not on file   Social Determinants of Health   Financial Resource Strain:   . Difficulty of Paying Living Expenses:   Food Insecurity:   . Worried About Charity fundraiser in the Last Year:   . Arboriculturist in the Last Year:   Transportation Needs:   . Film/video editor (Medical):   Marland Kitchen Lack of Transportation (Non-Medical):   Physical Activity:   . Days of Exercise per Week:   . Minutes of Exercise per Session:   Stress:   . Feeling of Stress :   Social Connections:   . Frequency of Communication with Friends and Family:   . Frequency of Social Gatherings with Friends and Family:   . Attends Religious Services:   . Active Member of Clubs or Organizations:   . Attends Archivist Meetings:   Marland Kitchen Marital Status:   Intimate Partner Violence:   . Fear of Current or Ex-Partner:   . Emotionally Abused:   Marland Kitchen Physically Abused:   . Sexually Abused:     Outpatient Medications Prior to Visit  Medication Sig Dispense Refill  . Acetaminophen (TYLENOL 8 HOUR ARTHRITIS PAIN PO) Take 1 tablet by mouth at bedtime as needed.    . blood glucose meter kit and  supplies Dispense based on patient and insurance preference. Use up to four times daily as directed. Dx:E11.9 1 each 0  . Dapagliflozin-metFORMIN HCl ER (XIGDUO XR) 06-999 MG TB24 Take 1 tablet by mouth daily with breakfast. 90 tablet 1  . Insulin Detemir (LEVEMIR FLEXTOUCH) 100 UNIT/ML Pen Inject 10-20 Units into the skin at bedtime. 15 mL 3  . lisinopril (ZESTRIL) 2.5 MG tablet Take 1 tablet (2.5 mg total) by mouth daily. 90 tablet 3  . ONE TOUCH ULTRA TEST test strip USE 1 STRIP TO CHECK GLUCOSE UP TO 4 TIMES DAILY AS DIRECTED. 300 each 11  . rosuvastatin (CRESTOR) 20 MG tablet TAKE 1 TABLET BY MOUTH AT BEDTIME 90 tablet 0  . Canagliflozin-metFORMIN HCl ER (INVOKAMET XR) [phone] MG TB24 Take 1 tablet by mouth daily. 90 tablet 1   No facility-administered medications prior to visit.     Allergies  Allergen Reactions  . Ibuprofen Rash  . Iodinated Diagnostic Agents Rash    IV Contrast  . Sulfa Antibiotics Rash  . Atorvastatin Other (See Comments)    HA  . Naproxen Sodium   . Pravastatin Other (See Comments)    dizziness  . Simvastatin Other (See Comments)    Nightmares  . Sulfonamide Derivatives     REACTION: rash  . Trazodone And Nefazodone Other (See Comments)    Nightmares     ROS Review of Systems    Objective:    Physical Exam  Constitutional: She is oriented to person, place, and time. She appears well-developed and well-nourished.  HENT:  Head: Normocephalic and atraumatic.  Cardiovascular: Normal rate, regular rhythm and normal heart sounds.  Pulmonary/Chest: Effort normal and breath sounds normal.  Neurological: She is alert and oriented to person, place, and time.  Skin: Skin is warm and dry.  Psychiatric: She has a normal mood and affect. Her behavior is normal.    BP (!) 102/58   Pulse 75   Ht 5' 1"  (1.549 m)   Wt 152 lb (68.9 kg)   SpO2 99%   BMI 28.72 kg/m  Wt Readings from Last 3 Encounters:  12/22/19 152 lb (68.9 kg)  06/23/19 153 lb (69.4 kg)  02/17/19 147 lb (66.7 kg)     There are no preventive care reminders to display for this patient.  There are no preventive care reminders to display for this patient.  Lab Results  Component Value Date   TSH 1.940 03/04/2012   Lab Results  Component Value Date   WBC 11.8 (H) 02/11/2016   HGB 13.6 02/11/2016   HCT 40.3 02/11/2016   MCV 90.4 02/11/2016   PLT 353 02/11/2016   Lab Results  Component Value Date   NA 139 09/29/2019   K 4.7 09/29/2019   CO2 27 09/29/2019   GLUCOSE 135 (H) 09/29/2019   BUN 28 (H) 09/29/2019   CREATININE 1.10 (H) 09/29/2019   BILITOT 0.5 02/12/2019   ALKPHOS 63 02/07/2017   AST 20 02/12/2019   ALT 17 02/12/2019   PROT 7.3 02/12/2019   ALBUMIN 4.3 02/07/2017   CALCIUM 10.5 (H) 09/29/2019   Lab Results  Component Value Date   CHOL  210 (H) 02/12/2019   Lab Results  Component Value Date   HDL 69 02/12/2019   Lab Results  Component Value Date   LDLCALC 104 (H) 02/12/2019   Lab Results  Component Value Date   TRIG 242 (H) 02/12/2019   Lab Results  Component Value Date   CHOLHDL 3.0 02/12/2019  Lab Results  Component Value Date   HGBA1C 6.7 (A) 12/22/2019      Assessment & Plan:   Problem List Items Addressed This Visit      Endocrine   Hyperlipidemia associated with type 2 diabetes mellitus ([place])   Diabetes mellitus, type II ([place]) - Primary   Relevant Orders   Lipid panel   COMPLETE METABOLIC PANEL WITH GFR   POCT glycosylated hemoglobin (Hb A1C) (Completed)     Genitourinary   KIDNEY DISEASE, CHRONIC, STAGE III   Relevant Orders   Lipid panel   COMPLETE METABOLIC PANEL WITH GFR    Other Visit Diagnoses    Post-menopausal       Relevant Orders   DG Bone Density     Due for bone density check.  Test ordered.  No orders of the defined types were placed in this encounter.   Follow-up: Return in about 3 months (around 03/22/2020) for Diabetes follow-up.    Beatrice Lecher, MD

## 2020-01-07 ENCOUNTER — Encounter: Payer: Self-pay | Admitting: Family Medicine

## 2020-03-29 ENCOUNTER — Ambulatory Visit (INDEPENDENT_AMBULATORY_CARE_PROVIDER_SITE_OTHER): Payer: Medicare Other | Admitting: Family Medicine

## 2020-03-29 ENCOUNTER — Encounter: Payer: Self-pay | Admitting: Family Medicine

## 2020-03-29 ENCOUNTER — Other Ambulatory Visit: Payer: Self-pay

## 2020-03-29 VITALS — BP 103/56 | HR 69 | Ht 61.0 in | Wt 152.0 lb

## 2020-03-29 DIAGNOSIS — E119 Type 2 diabetes mellitus without complications: Secondary | ICD-10-CM | POA: Diagnosis not present

## 2020-03-29 DIAGNOSIS — J302 Other seasonal allergic rhinitis: Secondary | ICD-10-CM | POA: Diagnosis not present

## 2020-03-29 DIAGNOSIS — R5383 Other fatigue: Secondary | ICD-10-CM | POA: Diagnosis not present

## 2020-03-29 DIAGNOSIS — N1831 Chronic kidney disease, stage 3a: Secondary | ICD-10-CM

## 2020-03-29 DIAGNOSIS — Z8639 Personal history of other endocrine, nutritional and metabolic disease: Secondary | ICD-10-CM | POA: Diagnosis not present

## 2020-03-29 LAB — CBC WITH DIFFERENTIAL/PLATELET
Absolute Monocytes: 556 cells/uL (ref 200–950)
Basophils Absolute: 98 cells/uL (ref 0–200)
Basophils Relative: 0.9 %
Eosinophils Absolute: 567 cells/uL — ABNORMAL HIGH (ref 15–500)
Eosinophils Relative: 5.2 %
HCT: 42.1 % (ref 35.0–45.0)
Hemoglobin: 14.2 g/dL (ref 11.7–15.5)
Lymphs Abs: 7270 cells/uL — ABNORMAL HIGH (ref 850–3900)
MCH: 31.6 pg (ref 27.0–33.0)
MCHC: 33.7 g/dL (ref 32.0–36.0)
MCV: 93.8 fL (ref 80.0–100.0)
MPV: 11.5 fL (ref 7.5–12.5)
Monocytes Relative: 5.1 %
Neutro Abs: 2409 cells/uL (ref 1500–7800)
Neutrophils Relative %: 22.1 %
Platelets: 293 10*3/uL (ref 140–400)
RBC: 4.49 10*6/uL (ref 3.80–5.10)
RDW: 12 % (ref 11.0–15.0)
Total Lymphocyte: 66.7 %
WBC: 10.9 10*3/uL — ABNORMAL HIGH (ref 3.8–10.8)

## 2020-03-29 LAB — POCT GLYCOSYLATED HEMOGLOBIN (HGB A1C): Hemoglobin A1C: 6.8 % — AB (ref 4.0–5.6)

## 2020-03-29 LAB — IRON,TIBC AND FERRITIN PANEL
%SAT: 18 % (calc) (ref 16–45)
Ferritin: 39 ng/mL (ref 16–288)
Iron: 76 ug/dL (ref 45–160)
TIBC: 413 mcg/dL (calc) (ref 250–450)

## 2020-03-29 LAB — TSH: TSH: 2.18 mIU/L (ref 0.40–4.50)

## 2020-03-29 LAB — VITAMIN B12: Vitamin B-12: 556 pg/mL (ref 200–1100)

## 2020-03-29 NOTE — Assessment & Plan Note (Signed)
Last renal function stable though BUN was elevated.  Plan to recheck at follow-up in 4 months.

## 2020-03-29 NOTE — Progress Notes (Signed)
Established Patient Office Visit  Subjective:  Patient ID: Hannah Rojas, female    DOB: Feb 09, 1946  Age: 74 y.o. MRN: [id number]  CC: No chief complaint on file.   HPI Hannah Rojas presents for   Diabetes - no hypoglycemic events. No wounds or sores that are not healing well. No increased thirst or urination. Checking glucose at home. Taking medications as prescribed without any side effects.  Using 8 units of Levemir.  F/U CKD 3 - no changes.    She is also been struggling with some allergy symptoms but recently started some Alka-Seltzer and says that actually really helped.  He also reports just feeling really fatigued.  She says when she goes to work and works 8 hours and comes home she just sits in her chair and really does not feel like she has enough energy to do anything else.  She denies any blood in her urine or stool.  She denies feeling down or depressed or hopeless.  She says that she has not been exercising.  She says her sleep is okay.  Does have a history of iron deficiency anemia.  Past Medical History:  Diagnosis Date   Cataracts, bilateral    bilateral   H. pylori infection    history   Pancreatic cyst    hx mucinous cystoadenoma    Past Surgical History:  Procedure Laterality Date   CHOLECYSTECTOMY     ESOPHAGOGASTRODUODENOSCOPY     PANCREATIC CYST EXCISION     spleenectomy      No family history on file.  Social History   Socioeconomic History   Marital status: Widowed    Spouse name: Not on file   Number of children: Not on file   Years of education: Not on file   Highest education level: Not on file  Occupational History   Not on file  Tobacco Use   Smoking status: Never Smoker   Smokeless tobacco: Never Used  Substance and Sexual Activity   Alcohol use: No   Drug use: Not on file   Sexual activity: Not on file    Comment: works at [employer]  Other Topics Concern   Not on file  Social History Narrative    Not on file   Social Determinants of Health   Financial Resource Strain:    Difficulty of Paying Living Expenses:   Food Insecurity:    Worried About Charity fundraiser in the Last Year:    Arboriculturist in the Last Year:   Transportation Needs:    Film/video editor (Medical):    Lack of Transportation (Non-Medical):   Physical Activity:    Days of Exercise per Week:    Minutes of Exercise per Session:   Stress:    Feeling of Stress :   Social Connections:    Frequency of Communication with Friends and Family:    Frequency of Social Gatherings with Friends and Family:    Attends Religious Services:    Active Member of Clubs or Organizations:    Attends Music therapist:    Marital Status:   Intimate Partner Violence:    Fear of Current or Ex-Partner:    Emotionally Abused:    Physically Abused:    Sexually Abused:     Outpatient Medications Prior to Visit  Medication Sig Dispense Refill   Acetaminophen (TYLENOL 8 HOUR ARTHRITIS PAIN PO) Take 1 tablet by mouth at bedtime as needed.  blood glucose meter kit and supplies Dispense based on patient and insurance preference. Use up to four times daily as directed. Dx:E11.9 1 each 0   Dapagliflozin-metFORMIN HCl ER (XIGDUO XR) 06-999 MG TB24 Take 1 tablet by mouth daily with breakfast. 90 tablet 1   Insulin Detemir (LEVEMIR FLEXTOUCH) 100 UNIT/ML Pen Inject 10-20 Units into the skin at bedtime. 15 mL 3   lisinopril (ZESTRIL) 2.5 MG tablet Take 1 tablet (2.5 mg total) by mouth daily. 90 tablet 3   ONE TOUCH ULTRA TEST test strip USE 1 STRIP TO CHECK GLUCOSE UP TO 4 TIMES DAILY AS DIRECTED. 300 each 11   rosuvastatin (CRESTOR) 20 MG tablet TAKE 1 TABLET BY MOUTH AT BEDTIME 90 tablet 3   No facility-administered medications prior to visit.    Allergies  Allergen Reactions   Ibuprofen Rash   Iodinated Diagnostic Agents Rash    IV Contrast   Sulfa Antibiotics Rash    Atorvastatin Other (See Comments)    HA   Naproxen Sodium    Pravastatin Other (See Comments)    dizziness   Simvastatin Other (See Comments)    Nightmares   Sulfonamide Derivatives     REACTION: rash   Trazodone And Nefazodone Other (See Comments)    Nightmares     ROS Review of Systems    Objective:    Physical Exam Constitutional:      Appearance: She is well-developed.  HENT:     Head: Normocephalic and atraumatic.     Comments: Mild swelling under both eyes. Cardiovascular:     Rate and Rhythm: Normal rate and regular rhythm.     Heart sounds: Normal heart sounds.  Pulmonary:     Effort: Pulmonary effort is normal.     Breath sounds: Normal breath sounds.  Musculoskeletal:     Comments: Trace swelling around the left lateral ankle.  No swelling over the right ankle.  Skin:    General: Skin is warm and dry.  Neurological:     Mental Status: She is alert and oriented to person, place, and time.  Psychiatric:        Behavior: Behavior normal.     BP (!) 103/56    Pulse 69    Ht 5' 1" (1.549 m)    Wt 152 lb (68.9 kg)    SpO2 99%    BMI 28.72 kg/m  Wt Readings from Last 3 Encounters:  03/29/20 152 lb (68.9 kg)  12/22/19 152 lb (68.9 kg)  06/23/19 153 lb (69.4 kg)     Health Maintenance Due  Topic Date Due   DEXA SCAN  12/27/2019    There are no preventive care reminders to display for this patient.  Lab Results  Component Value Date   TSH 1.940 03/04/2012   Lab Results  Component Value Date   WBC 11.8 (H) 02/11/2016   HGB 13.6 02/11/2016   HCT 40.3 02/11/2016   MCV 90.4 02/11/2016   PLT 353 02/11/2016   Lab Results  Component Value Date   NA 142 12/22/2019   K 5.0 12/22/2019   CO2 30 12/22/2019   GLUCOSE 100 (H) 12/22/2019   BUN 30 (H) 12/22/2019   CREATININE 1.05 (H) 12/22/2019   BILITOT 0.9 12/22/2019   ALKPHOS 63 02/07/2017   AST 21 12/22/2019   ALT 17 12/22/2019   PROT 7.8 12/22/2019   ALBUMIN 4.3 02/07/2017   CALCIUM 10.5  (H) 12/22/2019   Lab Results  Component Value Date   CHOL  157 12/22/2019   Lab Results  Component Value Date   HDL 74 12/22/2019   Lab Results  Component Value Date   LDLCALC 65 12/22/2019   Lab Results  Component Value Date   TRIG 93 12/22/2019   Lab Results  Component Value Date   CHOLHDL 2.1 12/22/2019   Lab Results  Component Value Date   HGBA1C 6.8 (A) 03/29/2020      Assessment & Plan:   Problem List Items Addressed This Visit      Endocrine   Diabetes mellitus, type II ([place]) - Primary    We discussed increasing insulin up to 10 units.  I think she should be able to tolerate this well without any hypoglycemia.  If she starts to notice any lows then she can always go back down to 8 units.      Relevant Orders   POCT glycosylated hemoglobin (Hb A1C) (Completed)   CBC with Differential/Platelet   TSH     Genitourinary   KIDNEY DISEASE, CHRONIC, STAGE III    Last renal function stable though BUN was elevated.  Plan to recheck at follow-up in 4 months.        Other   Seasonal allergies   History of iron deficiency   Relevant Orders   Fe+TIBC+Fer   B12    Other Visit Diagnoses    Fatigue, unspecified type       Relevant Orders   CBC with Differential/Platelet   TSH   B12     Fatigue-evaluate for anemia/iron deficiency.  Also evaluate for thyroid disorder.  In fact I do not know that we have ever checked her thyroid since she is been a patient here.  A1c is well controlled decision should not be contributing.  Just encouraged her to drink plenty of water and also encouraged her to start walking/exercising more regularly to help improve her endurance and stamina.  No orders of the defined types were placed in this encounter.   Follow-up: Return in about 4 months (around 07/30/2020) for Diabetes follow-up.    Beatrice Lecher, MD

## 2020-03-29 NOTE — Assessment & Plan Note (Signed)
We discussed increasing insulin up to 10 units.  I think she should be able to tolerate this well without any hypoglycemia.  If she starts to notice any lows then she can always go back down to 8 units.

## 2020-04-06 ENCOUNTER — Other Ambulatory Visit: Payer: Self-pay | Admitting: Family Medicine

## 2020-04-06 DIAGNOSIS — E119 Type 2 diabetes mellitus without complications: Secondary | ICD-10-CM

## 2020-04-15 ENCOUNTER — Other Ambulatory Visit: Payer: Self-pay | Admitting: Neurology

## 2020-04-15 MED ORDER — LISINOPRIL 2.5 MG PO TABS: 2.5000 mg | ORAL_TABLET | Freq: Every day | ORAL | 3 refills | Status: DC

## 2020-04-21 ENCOUNTER — Other Ambulatory Visit: Payer: Self-pay | Admitting: *Deleted

## 2020-04-21 MED ORDER — LISINOPRIL 2.5 MG PO TABS: 2.5000 mg | ORAL_TABLET | Freq: Every day | ORAL | 3 refills | Status: DC

## 2020-06-08 ENCOUNTER — Other Ambulatory Visit: Payer: Self-pay | Admitting: Family Medicine

## 2020-06-08 DIAGNOSIS — E119 Type 2 diabetes mellitus without complications: Secondary | ICD-10-CM

## 2020-07-23 ENCOUNTER — Other Ambulatory Visit: Payer: Self-pay | Admitting: Family Medicine

## 2020-07-23 DIAGNOSIS — E119 Type 2 diabetes mellitus without complications: Secondary | ICD-10-CM

## 2020-08-02 ENCOUNTER — Other Ambulatory Visit: Payer: Self-pay

## 2020-08-02 ENCOUNTER — Encounter: Payer: Self-pay | Admitting: Family Medicine

## 2020-08-02 ENCOUNTER — Ambulatory Visit (INDEPENDENT_AMBULATORY_CARE_PROVIDER_SITE_OTHER): Payer: Medicare Other | Admitting: Family Medicine

## 2020-08-02 VITALS — BP 120/63 | HR 77 | Ht 61.0 in | Wt 155.0 lb

## 2020-08-02 DIAGNOSIS — E119 Type 2 diabetes mellitus without complications: Secondary | ICD-10-CM | POA: Diagnosis not present

## 2020-08-02 DIAGNOSIS — N1831 Chronic kidney disease, stage 3a: Secondary | ICD-10-CM | POA: Diagnosis not present

## 2020-08-02 LAB — POCT GLYCOSYLATED HEMOGLOBIN (HGB A1C): Hemoglobin A1C: 7.1 % — AB (ref 4.0–5.6)

## 2020-08-02 NOTE — Patient Instructions (Signed)
Please go back up to 10  Units on your Levemir  work on starting to walk for exercise again.

## 2020-08-02 NOTE — Assessment & Plan Note (Signed)
Following renal function every 6 months.  Due for BMP today.

## 2020-08-02 NOTE — Assessment & Plan Note (Addendum)
Hemoglobin A1c elevated today at 7.1.  She was quite disappointed but says that her sugars have been sort of bouncing around all over the place.  It sounds like she has not been walking consistently she like she used to and she is also sometimes been skipping her evening meal because she just feels exhausted when she gets off of work and so sometimes will just have a glass of water and go to bed so almost wonder if she is having some rebound hyperglycemia from the liver so we discussed try to have something even a piece of medial or you know I have protein drink etc. just to see if that improves.  Follow-up in 3 months.  Back up to 10 units on the Levemir.

## 2020-08-02 NOTE — Progress Notes (Signed)
Established Patient Office Visit  Subjective:  Patient ID: Hannah Rojas, female    DOB: [birth year]  Age: 74 y.o. MRN: [id number]  CC:  Chief Complaint  Patient presents with  . Diabetes    HPI Hannah Rojas presents for 4 mo  Diabetes - no hypoglycemic events. No wounds or sores that are not healing well. No increased thirst or urination. Checking glucose at home. Taking medications as prescribed without any side effects.  F/U CKD 3  - continue to 2.5 mg lisinopril.    Past Medical History:  Diagnosis Date  . Cataracts, bilateral    bilateral  . H. pylori infection    history  . Pancreatic cyst    hx mucinous cystoadenoma    Past Surgical History:  Procedure Laterality Date  . CHOLECYSTECTOMY    . ESOPHAGOGASTRODUODENOSCOPY    . PANCREATIC CYST EXCISION    . spleenectomy      No family history on file.  Social History   Socioeconomic History  . Marital status: Widowed    Spouse name: Not on file  . Number of children: Not on file  . Years of education: Not on file  . Highest education level: Not on file  Occupational History  . Not on file  Tobacco Use  . Smoking status: Never Smoker  . Smokeless tobacco: Never Used  Substance and Sexual Activity  . Alcohol use: No  . Drug use: Not on file  . Sexual activity: Not on file    Comment: works at [employer]  Other Topics Concern  . Not on file  Social History Narrative  . Not on file   Social Determinants of Health   Financial Resource Strain:   . Difficulty of Paying Living Expenses: Not on file  Food Insecurity:   . Worried About Charity fundraiser in the Last Year: Not on file  . Ran Out of Food in the Last Year: Not on file  Transportation Needs:   . Lack of Transportation (Medical): Not on file  . Lack of Transportation (Non-Medical): Not on file  Physical Activity:   . Days of Exercise per Week: Not on file  . Minutes of Exercise per Session: Not on file  Stress:   . Feeling of  Stress : Not on file  Social Connections:   . Frequency of Communication with Friends and Family: Not on file  . Frequency of Social Gatherings with Friends and Family: Not on file  . Attends Religious Services: Not on file  . Active Member of Clubs or Organizations: Not on file  . Attends Archivist Meetings: Not on file  . Marital Status: Not on file  Intimate Partner Violence:   . Fear of Current or Ex-Partner: Not on file  . Emotionally Abused: Not on file  . Physically Abused: Not on file  . Sexually Abused: Not on file    Outpatient Medications Prior to Visit  Medication Sig Dispense Refill  . Acetaminophen (TYLENOL 8 HOUR ARTHRITIS PAIN PO) Take 1 tablet by mouth at bedtime as needed.    . blood glucose meter kit and supplies Dispense based on patient and insurance preference. Use up to four times daily as directed. Dx:E11.9 1 each 0  . Insulin Detemir (LEVEMIR FLEXTOUCH) 100 UNIT/ML Pen Inject 10-20 Units into the skin at bedtime. 15 mL 3  . lisinopril (ZESTRIL) 2.5 MG tablet Take 1 tablet (2.5 mg total) by mouth daily. 90 tablet 3  . ONE TOUCH  ULTRA TEST test strip USE 1 STRIP TO CHECK GLUCOSE UP TO 4 TIMES DAILY AS DIRECTED. 300 each 11  . rosuvastatin (CRESTOR) 20 MG tablet TAKE 1 TABLET BY MOUTH AT BEDTIME 90 tablet 3  . XIGDUO XR 06-999 MG TB24 TAKE 1 TABLET BY MOUTH  DAILY WITH BREAKFAST 90 tablet 3   No facility-administered medications prior to visit.    Allergies  Allergen Reactions  . Ibuprofen Rash  . Iodinated Diagnostic Agents Rash    IV Contrast  . Sulfa Antibiotics Rash  . Atorvastatin Other (See Comments)    HA  . Naproxen Sodium   . Pravastatin Other (See Comments)    dizziness  . Simvastatin Other (See Comments)    Nightmares  . Sulfonamide Derivatives     REACTION: rash  . Trazodone And Nefazodone Other (See Comments)    Nightmares     ROS Review of Systems    Objective:    Physical Exam Constitutional:      Appearance: She  is well-developed.  HENT:     Head: Normocephalic and atraumatic.  Cardiovascular:     Rate and Rhythm: Normal rate and regular rhythm.     Heart sounds: Normal heart sounds.  Pulmonary:     Effort: Pulmonary effort is normal.     Breath sounds: Normal breath sounds.  Skin:    General: Skin is warm and dry.  Neurological:     Mental Status: She is alert and oriented to person, place, and time.  Psychiatric:        Behavior: Behavior normal.     BP 120/63   Pulse 77   Ht _0  (1.549 m)   Wt 155 lb (70.3 kg)   SpO2 100%   BMI 29.29 kg/m  Wt Readings from Last 3 Encounters:  08/02/20 155 lb (70.3 kg)  03/29/20 152 lb (68.9 kg)  12/22/19 152 lb (68.9 kg)     Health Maintenance Due  Topic Date Due  . DEXA SCAN  12/27/2019    There are no preventive care reminders to display for this patient.  Lab Results  Component Value Date   TSH 2.18 03/29/2020   Lab Results  Component Value Date   WBC 10.9 (H) 03/29/2020   HGB 14.2 03/29/2020   HCT 42.1 03/29/2020   MCV 93.8 03/29/2020   PLT 293 03/29/2020   Lab Results  Component Value Date   NA 142 12/22/2019   K 5.0 12/22/2019   CO2 30 12/22/2019   GLUCOSE 100 (H) 12/22/2019   BUN 30 (H) 12/22/2019   CREATININE 1.05 (H) 12/22/2019   BILITOT 0.9 12/22/2019   ALKPHOS 63 02/07/2017   AST 21 12/22/2019   ALT 17 12/22/2019   PROT 7.8 12/22/2019   ALBUMIN 4.3 02/07/2017   CALCIUM 10.5 (H) 12/22/2019   Lab Results  Component Value Date   CHOL 157 12/22/2019   Lab Results  Component Value Date   HDL 74 12/22/2019   Lab Results  Component Value Date   LDLCALC 65 12/22/2019   Lab Results  Component Value Date   TRIG 93 12/22/2019   Lab Results  Component Value Date   CHOLHDL 2.1 12/22/2019   Lab Results  Component Value Date   HGBA1C 7.1 (A) 08/02/2020      Assessment & Plan:   Problem List Items Addressed This Visit      Endocrine   Diabetes mellitus, type II ([place]) - Primary    Hemoglobin  A1c elevated today  at 7.1.  She was quite disappointed but says that her sugars have been sort of bouncing around all over the place.  It sounds like she has not been walking consistently she like she used to and she is also sometimes been skipping her evening meal because she just feels exhausted when she gets off of work and so sometimes will just have a glass of water and go to bed so almost wonder if she is having some rebound hyperglycemia from the liver so we discussed try to have something even a piece of medial or you know I have protein drink etc. just to see if that improves.  Follow-up in 3 months.  Back up to 10 units on the Levemir.      Relevant Orders   POCT glycosylated hemoglobin (Hb A1C) (Completed)   BASIC METABOLIC PANEL WITH GFR     Genitourinary   KIDNEY DISEASE, CHRONIC, STAGE III    Following renal function every 6 months.  Due for BMP today.      Relevant Orders   BASIC METABOLIC PANEL WITH GFR      No orders of the defined types were placed in this encounter.   Follow-up: Return in about 3 months (around 11/02/2020) for Diabetes follow-up.    Beatrice Lecher, MD

## 2020-08-03 LAB — BASIC METABOLIC PANEL WITH GFR
BUN/Creatinine Ratio: 19 (calc) (ref 6–22)
BUN: 19 mg/dL (ref 7–25)
CO2: 27 mmol/L (ref 20–32)
Calcium: 10.2 mg/dL (ref 8.6–10.4)
Chloride: 103 mmol/L (ref 98–110)
Creat: 1 mg/dL — ABNORMAL HIGH (ref 0.60–0.93)
GFR, Est African American: 65 mL/min/{1.73_m2} (ref >=60)
GFR, Est Non African American: 56 mL/min/{1.73_m2} — ABNORMAL LOW (ref >=60)
Glucose, Bld: 99 mg/dL (ref 65–99)
Potassium: 4.7 mmol/L (ref 3.5–5.3)
Sodium: 141 mmol/L (ref 135–146)

## 2020-08-03 NOTE — Progress Notes (Signed)
All labs are normal. 

## 2020-09-16 ENCOUNTER — Other Ambulatory Visit: Payer: Self-pay | Admitting: Family Medicine

## 2020-11-01 ENCOUNTER — Encounter: Payer: Self-pay | Admitting: Family Medicine

## 2020-11-01 ENCOUNTER — Other Ambulatory Visit: Payer: Self-pay

## 2020-11-01 ENCOUNTER — Ambulatory Visit (INDEPENDENT_AMBULATORY_CARE_PROVIDER_SITE_OTHER): Payer: Medicare Other | Admitting: Family Medicine

## 2020-11-01 ENCOUNTER — Ambulatory Visit (INDEPENDENT_AMBULATORY_CARE_PROVIDER_SITE_OTHER): Payer: Medicare Other

## 2020-11-01 VITALS — BP 117/62 | HR 66 | Ht 61.0 in | Wt 158.0 lb

## 2020-11-01 DIAGNOSIS — M4126 Other idiopathic scoliosis, lumbar region: Secondary | ICD-10-CM

## 2020-11-01 DIAGNOSIS — E1169 Type 2 diabetes mellitus with other specified complication: Secondary | ICD-10-CM | POA: Diagnosis not present

## 2020-11-01 DIAGNOSIS — G8929 Other chronic pain: Secondary | ICD-10-CM

## 2020-11-01 DIAGNOSIS — M533 Sacrococcygeal disorders, not elsewhere classified: Secondary | ICD-10-CM

## 2020-11-01 DIAGNOSIS — N1831 Chronic kidney disease, stage 3a: Secondary | ICD-10-CM | POA: Diagnosis not present

## 2020-11-01 DIAGNOSIS — E119 Type 2 diabetes mellitus without complications: Secondary | ICD-10-CM | POA: Diagnosis not present

## 2020-11-01 DIAGNOSIS — M545 Low back pain, unspecified: Secondary | ICD-10-CM | POA: Diagnosis not present

## 2020-11-01 DIAGNOSIS — M25551 Pain in right hip: Secondary | ICD-10-CM | POA: Diagnosis not present

## 2020-11-01 DIAGNOSIS — E785 Hyperlipidemia, unspecified: Secondary | ICD-10-CM | POA: Diagnosis not present

## 2020-11-01 LAB — POCT GLYCOSYLATED HEMOGLOBIN (HGB A1C): Hemoglobin A1C: 7.5 % — AB (ref 4.0–5.6)

## 2020-11-01 MED ORDER — PREDNISONE 20 MG PO TABS: 40.0000 mg | ORAL_TABLET | Freq: Every day | ORAL | 0 refills | Status: DC

## 2020-11-01 NOTE — Progress Notes (Signed)
Pt reports that about 1 week ago her R leg went numb.

## 2020-11-01 NOTE — Assessment & Plan Note (Signed)
Uncontrolled.  A1C went up to 7.5.  Increase Levemir from 10 units up to 14 units.  If she sees any lows blood sugars please let us know our goal is to have her fasting sugars under 130 and we discussed that today.

## 2020-11-01 NOTE — Progress Notes (Signed)
Established Patient Office Visit  Subjective:  Patient ID: Hannah Rojas, female    DOB: 07/18/1946  Age: 75 y.o. MRN: [id number]  CC:  Chief Complaint  Patient presents with  . Diabetes  . Leg Pain    HPI Hannah Rojas presents for   Diabetes - no hypoglycemic events. No wounds or sores that are not healing well. No increased thirst or urination. Checking glucose at home. Taking medications as prescribed without any side effects.  Hyperlipidemia - tolerating stating well with no myalgias or significant side effects.  Lab Results  Component Value Date   CHOL 157 12/22/2019   HDL 74 12/22/2019   LDLCALC 65 12/22/2019   LDLDIRECT 107 (H) 05/27/2014   TRIG 93 12/22/2019   CHOLHDL 2.1 12/22/2019   She also c/o or right hip pain.  She points just slightly posterior to the trochanteric bursa.  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  She said this sensation lasted a couple of minutes she said she hobbled over to a place where she can actually sit down and rest and it eventually went away she is never had it happen before.  She has been applying deep blue daily to that area on the posterior hip.  She denies any low back pain.   Past Medical History:  Diagnosis Date  . Cataracts, bilateral    bilateral  . H. pylori infection    history  . Pancreatic cyst    hx mucinous cystoadenoma    Past Surgical History:  Procedure Laterality Date  . CHOLECYSTECTOMY    . ESOPHAGOGASTRODUODENOSCOPY    . PANCREATIC CYST EXCISION    . spleenectomy      History reviewed. No pertinent family history.  Social History   Socioeconomic History  . Marital status: Widowed    Spouse name: Not on file  . Number of children: Not on file  . Years of education: Not on file  . Highest education level: Not on file  Occupational  History  . Not on file  Tobacco Use  . Smoking status: Never Smoker  . Smokeless tobacco: Never Used  Substance and Sexual Activity  . Alcohol use: No  . Drug use: Not on file  . Sexual activity: Not on file    Comment: works at [employer]  Other Topics Concern  . Not on file  Social History Narrative  . Not on file   Social Determinants of Health   Financial Resource Strain: Not on file  Food Insecurity: Not on file  Transportation Needs: Not on file  Physical Activity: Not on file  Stress: Not on file  Social Connections: Not on file  Intimate Partner Violence: Not on file    Outpatient Medications Prior to Visit  Medication Sig Dispense Refill  . Acetaminophen (TYLENOL 8 HOUR ARTHRITIS PAIN PO) Take 1 tablet by mouth at bedtime as needed.    . blood glucose meter kit and supplies Dispense based on patient and insurance preference. Use up to four times daily as directed. Dx:E11.9 1 each 0  . Insulin Detemir (LEVEMIR FLEXTOUCH) 100 UNIT/ML Pen Inject 10-20 Units into the skin at bedtime. 15 mL 3  . lisinopril (ZESTRIL) 2.5 MG tablet Take 1 tablet (2.5 mg total) by mouth daily. 90 tablet 3  .  ONE TOUCH ULTRA TEST test strip USE 1 STRIP TO CHECK GLUCOSE UP TO 4 TIMES DAILY AS DIRECTED. 300 each 11  . rosuvastatin (CRESTOR) 20 MG tablet TAKE 1 TABLET BY MOUTH  DAILY AT BEDTIME 90 tablet 3  . XIGDUO XR 06-999 MG TB24 TAKE 1 TABLET BY MOUTH  DAILY WITH BREAKFAST 90 tablet 3   No facility-administered medications prior to visit.    Allergies  Allergen Reactions  . Ibuprofen Rash  . Iodinated Diagnostic Agents Rash    IV Contrast  . Sulfa Antibiotics Rash  . Atorvastatin Other (See Comments)    HA  . Naproxen Sodium   . Pravastatin Other (See Comments)    dizziness  . Simvastatin Other (See Comments)    Nightmares  . Sulfonamide Derivatives     REACTION: rash  . Trazodone And Nefazodone Other (See Comments)    Nightmares     ROS Review of Systems    Objective:     Physical Exam Constitutional:      Appearance: She is well-developed and well-nourished.  HENT:     Head: Normocephalic and atraumatic.  Cardiovascular:     Rate and Rhythm: Normal rate and regular rhythm.     Heart sounds: Normal heart sounds.  Pulmonary:     Effort: Pulmonary effort is normal.     Breath sounds: Normal breath sounds.  Musculoskeletal:     Comments: She is tender over the right SI joint.  Hip, knee, ankle range of motion is normal strength is 5 out of 5 in all 3 joints.  Patellar reflexes 1+ bilaterally.  Nontender over the greater trochanter.  Skin:    General: Skin is warm and dry.  Neurological:     Mental Status: She is alert and oriented to person, place, and time.  Psychiatric:        Mood and Affect: Mood and affect normal.        Behavior: Behavior normal.     BP 117/62   Pulse 66   Ht 5' 1"  (1.549 m)   Wt 158 lb (71.7 kg)   SpO2 100%   BMI 29.85 kg/m  Wt Readings from Last 3 Encounters:  11/01/20 158 lb (71.7 kg)  08/02/20 155 lb (70.3 kg)  03/29/20 152 lb (68.9 kg)     Health Maintenance Due  Topic Date Due  . DEXA SCAN  12/27/2019    There are no preventive care reminders to display for this patient.  Lab Results  Component Value Date   TSH 2.18 03/29/2020   Lab Results  Component Value Date   WBC 10.9 (H) 03/29/2020   HGB 14.2 03/29/2020   HCT 42.1 03/29/2020   MCV 93.8 03/29/2020   PLT 293 03/29/2020   Lab Results  Component Value Date   NA 141 08/02/2020   K 4.7 08/02/2020   CO2 27 08/02/2020   GLUCOSE 99 08/02/2020   BUN 19 08/02/2020   CREATININE 1.00 (H) 08/02/2020   BILITOT 0.9 12/22/2019   ALKPHOS 63 02/07/2017   AST 21 12/22/2019   ALT 17 12/22/2019   PROT 7.8 12/22/2019   ALBUMIN 4.3 02/07/2017   CALCIUM 10.2 08/02/2020   Lab Results  Component Value Date   CHOL 157 12/22/2019   Lab Results  Component Value Date   HDL 74 12/22/2019   Lab Results  Component Value Date   LDLCALC 65 12/22/2019    Lab Results  Component Value Date   TRIG 93 12/22/2019   Lab  Results  Component Value Date   CHOLHDL 2.1 12/22/2019   Lab Results  Component Value Date   HGBA1C 7.5 (A) 11/01/2020      Assessment & Plan:   Problem List Items Addressed This Visit      Endocrine   Hyperlipidemia associated with type 2 diabetes mellitus ([place])    To recheck lipids in April.  Hopefully A1c will be down by then.      Diabetes mellitus, type II ([place]) - Primary    Uncontrolled.  A1C went up to 7.5.  Increase Levemir from 10 units up to 14 units.  If she sees any lows blood sugars please let us know our goal is to have her fasting sugars under 130 and we discussed that today.      Relevant Orders   POCT glycosylated hemoglobin (Hb A1C) (Completed)     Genitourinary   KIDNEY DISEASE, CHRONIC, STAGE III    Other Visit Diagnoses    Right hip pain       Chronic right SI joint pain       Relevant Medications   predniSONE (DELTASONE) 20 MG tablet   Other Relevant Orders   DG Lumbar Spine Complete   DG Sacrum/Coccyx     Right SI joint pain.  She is also pointing just posterior to that right hip as well I suspect that her pain is coming more from her low back area than it is the trochanteric bursa.  We will get x-rays of her low back I am concerned that she had a brief episode of numbness and weakness.  He would like to try a round of prednisone to see if this provides some relief which I think is not unreasonable.  Will call with results once available she might benefit from physical therapy.  Meds ordered this encounter  Medications  . predniSONE (DELTASONE) 20 MG tablet    Sig: Take 2 tablets (40 mg total) by mouth daily with breakfast.    Dispense:  10 tablet    Refill:  0    Follow-up: Return in about 3 months (around 01/29/2021) for Diabetes follow-up.    Beatrice Lecher, MD

## 2020-11-01 NOTE — Assessment & Plan Note (Signed)
To recheck lipids in April.  Hopefully A1c will be down by then.

## 2020-11-01 NOTE — Patient Instructions (Signed)
Please increase your Levemir to 14 units daily.  Goal is to have your blood sugars under 130 consistently.  If you start seeing blood sugars less than 80 then we may need to decrease your dose.  Continue to work on trying to increase activity with a goal of 30 minutes 5 days a week total.

## 2020-11-02 ENCOUNTER — Telehealth: Payer: Self-pay | Admitting: Family Medicine

## 2020-11-02 ENCOUNTER — Other Ambulatory Visit: Payer: Self-pay | Admitting: *Deleted

## 2020-11-02 DIAGNOSIS — N1831 Chronic kidney disease, stage 3a: Secondary | ICD-10-CM

## 2020-11-02 DIAGNOSIS — R9389 Abnormal findings on diagnostic imaging of other specified body structures: Secondary | ICD-10-CM

## 2020-11-03 NOTE — Telephone Encounter (Signed)
Entered in error

## 2020-11-15 ENCOUNTER — Other Ambulatory Visit: Payer: Self-pay

## 2020-11-15 ENCOUNTER — Ambulatory Visit (INDEPENDENT_AMBULATORY_CARE_PROVIDER_SITE_OTHER): Payer: Medicare Other

## 2020-11-15 DIAGNOSIS — M545 Low back pain, unspecified: Secondary | ICD-10-CM

## 2020-11-15 DIAGNOSIS — R9389 Abnormal findings on diagnostic imaging of other specified body structures: Secondary | ICD-10-CM | POA: Diagnosis not present

## 2020-11-17 ENCOUNTER — Other Ambulatory Visit: Payer: Self-pay | Admitting: *Deleted

## 2020-11-17 DIAGNOSIS — M533 Sacrococcygeal disorders, not elsewhere classified: Secondary | ICD-10-CM

## 2020-11-17 DIAGNOSIS — M419 Scoliosis, unspecified: Secondary | ICD-10-CM

## 2020-11-17 DIAGNOSIS — G8929 Other chronic pain: Secondary | ICD-10-CM

## 2020-11-17 NOTE — Progress Notes (Unsigned)
error 

## 2020-11-19 ENCOUNTER — Other Ambulatory Visit: Payer: Self-pay | Admitting: Family Medicine

## 2020-11-24 ENCOUNTER — Other Ambulatory Visit: Payer: Self-pay

## 2020-11-24 ENCOUNTER — Encounter: Payer: Self-pay | Admitting: Physical Therapy

## 2020-11-24 ENCOUNTER — Ambulatory Visit (INDEPENDENT_AMBULATORY_CARE_PROVIDER_SITE_OTHER): Payer: Medicare Other | Admitting: Physical Therapy

## 2020-11-24 DIAGNOSIS — M545 Low back pain, unspecified: Secondary | ICD-10-CM

## 2020-11-24 DIAGNOSIS — R6889 Other general symptoms and signs: Secondary | ICD-10-CM

## 2020-11-24 DIAGNOSIS — M6281 Muscle weakness (generalized): Secondary | ICD-10-CM

## 2020-11-24 DIAGNOSIS — G8929 Other chronic pain: Secondary | ICD-10-CM | POA: Diagnosis not present

## 2020-11-24 NOTE — Therapy (Signed)
[place], [place], [address] Phone: [phone]   Fax:  [phone]  Physical Therapy Evaluation  Patient Details  Name: Hannah Rojas MRN: [id number] Date of Birth: May 25, 1946 Referring Provider (PT): [name]   Encounter Date: 11/24/2020   PT End of Session - 11/24/20 1605    Visit Number 1    Number of Visits 12    Date for PT Re-Evaluation 01/05/21    PT Start Time 1520    PT Stop Time 1602    PT Time Calculation (min) 42 min    Activity Tolerance Patient tolerated treatment well    Behavior During Therapy [facility] for tasks assessed/performed           Past Medical History:  Diagnosis Date  . Cataracts, bilateral    bilateral  . H. pylori infection    history  . Pancreatic cyst    hx mucinous cystoadenoma    Past Surgical History:  Procedure Laterality Date  . CHOLECYSTECTOMY    . ESOPHAGOGASTRODUODENOSCOPY    . PANCREATIC CYST EXCISION    . spleenectomy      There were no vitals filed for this visit.    Subjective Assessment - 11/24/20 1520    Subjective Pt was having back and Rt hip pain and went to MD 1 month ago. Pt had CT scan and xrays and was given oral steroids. Pt states pain has decreased since being on meds. Pt works 40 hours per week at [employer]. Pain increases with forward flexion, pain eases meds and topical analgesic.    Limitations House hold activities;Standing    How long can you sit comfortably? 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 No/denies              [facility] PT Assessment - 11/24/20 0001      Assessment   Medical Diagnosis scoliosis    Referring Provider (PT) [name]    Onset Date/Surgical Date 10/19/20      Balance Screen   Has the patient fallen in the past 6 months No      Home  Environment   Additional Comments pt lives alone in 2nd level apartment. Her son helps with garbage and laundry when he is available      Prior Function   Level of Independence Independent      Posture/Postural Control   Posture Comments decreased lumbar lordosis      ROM / Strength   AROM / PROM / Strength AROM;Strength      AROM   AROM Assessment Site Lumbar    Lumbar Flexion limited 25%    Lumbar Extension limited 25%    Lumbar - Right Side Bend limited 25% pain end range    Lumbar - Left Side Bend limited 25%    Lumbar - Right Rotation WFL    Lumbar - Left Rotation [facility]      Strength   Strength Assessment Site Hip    Right/Left Hip Right;Left    Right Hip Flexion 3/5    Right Hip Extension 3/5    Right Hip ABduction 3/5    Left Hip Flexion 3/5    Left Hip Extension 3/5    Left Hip ABduction 4-/5      Flexibility   Soft Tissue Assessment /  Muscle Length yes    Hamstrings decreased length Rt>Lt    ITB decreased length Rt>Lt    Piriformis Decreased length Rt      Palpation   Spinal mobility hypomobile SIJ and L3-L5    Palpation comment increased mm spasticity lumbar paraspinals, Rt piriformis, bilat glutes      Special Tests    Special Tests Lumbar    Lumbar Tests Straight Leg Raise      Straight Leg Raise   Findings Negative    Side  --   bilat     Ambulation/Gait   Gait Comments antlagic gait with decreased stance time on Rt                      Objective measurements completed on examination: See above findings.       [place] Adult PT Treatment/Exercise - 11/24/20 0001      Exercises   Exercises Lumbar      Lumbar Exercises: Stretches   Passive Hamstring Stretch Right;Left;1 rep;30 seconds    Passive Hamstring Stretch Limitations supine with strap    Hip Flexor Stretch Right;Left;1 rep;30 seconds    Figure 4 Stretch 30 seconds;With overpressure;Supine    Other Lumbar Stretch Exercise lower trunk rotation x 10 with 1-2 second hold at end  range      Modalities   Modalities Moist Heat      Moist Heat Therapy   Number Minutes Moist Heat 10 Minutes    Moist Heat Location Lumbar Spine                  PT Education - 11/24/20 1604    Education Details HEP, PT POC and goals    Person(s) Educated Patient    Methods Explanation;Demonstration;Handout    Comprehension Verbalized understanding;Returned demonstration               PT Long Term Goals - 11/24/20 1606      PT LONG TERM GOAL #1   Title Pt will be independent with HEP    Time 66    Period Weeks    Status New    Target Date 01/05/21      PT LONG TERM GOAL #2   Title Pt will improve bilat hip strength to 4/5 to perform work duties with decreased pain    Time 6    Period Weeks    Status New    Target Date 01/05/21      PT LONG TERM GOAL #3   Title Pt will improve lumbar ROM to [facility] to perform bending activities with decreased pain    Time 6    Period Weeks    Status New    Target Date 01/05/21      PT LONG TERM GOAL #4   Title Pt will tolerate sitting x 1 hour with pain <= 1/10    Time 6    Period Weeks    Status New    Target Date 01/05/21                  Plan - 11/24/20 1546    Clinical Impression Statement Pt is a 75 y/o female who presents with increased low back and hip pain, decreased strength, activity tolerance and ROM and increased mm spasticity. pt will benefit from skilled PT to address deficits and improve functional mobility    Personal Factors and Comorbidities Age;Time since onset of injury/illness/exacerbation    Examination-Activity Limitations Bend;Carry;Sit;Lift  Examination-Participation Restrictions Occupation    Stability/Clinical Decision Making Stable/Uncomplicated    Clinical Decision Making Low    Rehab Potential Good    PT Frequency 2x / week    PT Duration 6 weeks    PT Treatment/Interventions Dry needling;Taping;Passive range of motion;Manual techniques;Patient/family education;Therapeutic  activities;Therapeutic exercise;Neuromuscular re-education;Cryotherapy;Aquatic Therapy;ADLs/Self Care Home Management;Electrical Stimulation;Iontophoresis 4mg /ml Dexamethasone;Moist Heat;Ultrasound    PT Next Visit Plan assess HEP, add hip and core strength, manual and modalties as needed    PT Home Exercise Plan Access Code: [email or web address]    Consulted and Agree with Plan of Care Patient           Patient will benefit from skilled therapeutic intervention in order to improve the following deficits and impairments:  Abnormal gait,Increased muscle spasms,Decreased range of motion,Pain,Impaired flexibility,Decreased activity tolerance,Decreased strength,Hypomobility  Visit Diagnosis: Chronic right-sided low back pain without sciatica - Plan: PT plan of care cert/re-cert  Muscle weakness (generalized) - Plan: PT plan of care cert/re-cert  Decreased activity tolerance - Plan: PT plan of care cert/re-cert     Problem List Patient Active Problem List   Diagnosis Date Noted  . Seasonal allergies 03/29/2020  . History of iron deficiency 03/29/2020  . Hyperlipidemia associated with type 2 diabetes mellitus ([place]) 03/13/2016  . Obesity (BMI 30-39.9) 11/19/2013  . 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 Pancreatic cyst 07/12/2011  . Diabetes mellitus, type II ([place]) 05/17/2007  . KIDNEY DISEASE, CHRONIC, STAGE III 05/17/2007  . ABNORMAL SERUM ENZYME LEVEL NEC 05/16/2007  . 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 SCOLIOSIS 06/26/2006   Jovonna Nickell, PT  Tymira Horkey 11/24/2020, 4:28 PM  [facility] [place], [place], [address] Phone: [phone]   Fax:  [phone]  Name: Hannah Rojas MRN: [id number] Date of Birth: 05/11/1946

## 2020-11-24 NOTE — Patient Instructions (Signed)
Access Code: [email or web address] URL: [email or web address] Date: 11/24/2020 Prepared by: Isabelle Course  Exercises Hooklying Hamstring Stretch with Strap - 1 x daily - 7 x weekly - 3 sets - 1 reps - 20-30 seconds hold Seated Hamstring Stretch - 1 x daily - 7 x weekly - 3 sets - 1 reps - 20-30 seonds hold Supine Lower Trunk Rotation - 1 x daily - 7 x weekly - 2 sets - 10 reps Supine Figure 4 Piriformis Stretch - 1 x daily - 7 x weekly - 3 sets - 1 reps - 20-30 seconds hold Seated Hip Flexor Stretch - 1 x daily - 7 x weekly - 3 sets - 1 reps - 20-30 seconds hold

## 2020-12-01 ENCOUNTER — Ambulatory Visit: Payer: Medicare Other | Admitting: Physical Therapy

## 2020-12-01 ENCOUNTER — Other Ambulatory Visit: Payer: Self-pay

## 2020-12-01 ENCOUNTER — Encounter: Payer: Self-pay | Admitting: Physical Therapy

## 2020-12-01 DIAGNOSIS — R6889 Other general symptoms and signs: Secondary | ICD-10-CM

## 2020-12-01 DIAGNOSIS — G8929 Other chronic pain: Secondary | ICD-10-CM | POA: Diagnosis not present

## 2020-12-01 DIAGNOSIS — M6281 Muscle weakness (generalized): Secondary | ICD-10-CM

## 2020-12-01 DIAGNOSIS — M545 Low back pain, unspecified: Secondary | ICD-10-CM

## 2020-12-01 NOTE — Therapy (Signed)
[place], [place], [address] Phone: [phone]   Fax:  [phone]  Physical Therapy Treatment  Patient Details  Name: Hannah Rojas MRN: [id number] Date of Birth: 19-Jun-1946 Referring Provider (PT): [name]   Encounter Date: 12/01/2020   PT End of Session - 12/01/20 1559    Visit Number 2    Number of Visits 12    Date for PT Re-Evaluation 01/05/21    PT Start Time 1518    PT Stop Time 1558    PT Time Calculation (min) 40 min    Activity Tolerance Patient tolerated treatment well    Behavior During Therapy [facility] for tasks assessed/performed           Past Medical History:  Diagnosis Date  . Cataracts, bilateral    bilateral  . H. pylori infection    history  . Pancreatic cyst    hx mucinous cystoadenoma    Past Surgical History:  Procedure Laterality Date  . CHOLECYSTECTOMY    . ESOPHAGOGASTRODUODENOSCOPY    . PANCREATIC CYST EXCISION    . spleenectomy      There were no vitals filed for this visit.   Subjective Assessment - 12/01/20 1523    Subjective Pt reports she has been doing her exercises 2-3x/day.  She reports some cramping in Lt hamstring with one of the exercises.  At her worst, pain is up to 4/10, but doesn't have pain today.    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 No/denies              [facility] PT Assessment - 12/01/20 0001      Assessment   Medical Diagnosis scoliosis    Referring Provider (PT) [name]    Onset Date/Surgical Date 10/19/20    Next MD Visit PRN           [facility] Adult PT Treatment/Exercise - 12/01/20 0001      Self-Care   Self-Care Other Self-Care Comments    Other Self-Care Comments  Instructed pt in self massage with roller stick to LE and ball to hip and LB musculature; pt return demo with cues.      Lumbar Exercises: Stretches   Passive Hamstring Stretch Right;Left;2  reps;30 seconds   hooklying with strap   Passive Hamstring Stretch Limitations reviewed seated version x 15 sec each side.    Hip Flexor Stretch Right;Left;2 reps;20 seconds   seated, cues for form.   Piriformis Stretch Right;Left;1 rep;30 seconds   seated   Figure 4 Stretch 1 rep;30 seconds;Supine    Other Lumbar Stretch Exercise lower trunk rotation x 10 with 1-2 second hold at end range      Lumbar Exercises: Aerobic   Nustep L4: 4 min for warm up      Lumbar Exercises: Supine   Other Supine Lumbar Exercises reviewed log roll for supine to/from sitting x 2 reps      Modalities   Modalities --   held; painfree                      PT Long Term Goals - 11/24/20 1606      PT LONG TERM GOAL #1   Title Pt will be independent with HEP    Time 66    Period Weeks    Status New    Target Date 01/05/21  PT LONG TERM GOAL #2   Title Pt will improve bilat hip strength to 4/5 to perform work duties with decreased pain    Time 6    Period Weeks    Status New    Target Date 01/05/21      PT LONG TERM GOAL #3   Title Pt will improve lumbar ROM to [facility] to perform bending activities with decreased pain    Time 6    Period Weeks    Status New    Target Date 01/05/21      PT LONG TERM GOAL #4   Title Pt will tolerate sitting x 1 hour with pain <= 1/10    Time 6    Period Weeks    Status New    Target Date 01/05/21                 Plan - 12/01/20 1543    Clinical Impression Statement Pt required minor cues for form on HEP exercises.  Rt hip rotators tighter than Lt; difficulty with piriformis stretch.  Goals are ongoing.    Personal Factors and Comorbidities Age;Time since onset of injury/illness/exacerbation    Examination-Activity Limitations Bend;Carry;Sit;Lift    Examination-Participation Restrictions Occupation    Stability/Clinical Decision Making Stable/Uncomplicated    Rehab Potential Good    PT Frequency 2x / week    PT Duration 6 weeks    PT  Treatment/Interventions Dry needling;Taping;Passive range of motion;Manual techniques;Patient/family education;Therapeutic activities;Therapeutic exercise;Neuromuscular re-education;Cryotherapy;Aquatic Therapy;ADLs/Self Care Home Management;Electrical Stimulation;Iontophoresis 4mg /ml Dexamethasone;Moist Heat;Ultrasound    PT Next Visit Plan add hip and core strength, manual and modalties if needed    PT Home Exercise Plan Access Code: [email or web address]    Consulted and Agree with Plan of Care Patient           Patient will benefit from skilled therapeutic intervention in order to improve the following deficits and impairments:  Abnormal gait,Increased muscle spasms,Decreased range of motion,Pain,Impaired flexibility,Decreased activity tolerance,Decreased strength,Hypomobility  Visit Diagnosis: Chronic right-sided low back pain without sciatica  Muscle weakness (generalized)  Decreased activity tolerance     Problem List Patient Active Problem List   Diagnosis Date Noted  . Seasonal allergies 03/29/2020  . History of iron deficiency 03/29/2020  . Hyperlipidemia associated with type 2 diabetes mellitus ([place]) 03/13/2016  . Obesity (BMI 30-39.9) 11/19/2013  . 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 Pancreatic cyst 07/12/2011  . Diabetes mellitus, type II ([place]) 05/17/2007  . KIDNEY DISEASE, CHRONIC, STAGE III 05/17/2007  . ABNORMAL SERUM ENZYME LEVEL NEC 05/16/2007  . 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 SCOLIOSIS 06/26/2006    Kerin Perna, PTA 12/01/20 5:08 PM  [place], [place], [address] Phone: [phone]   Fax:  [phone]  Name: MYEESHA SHANE MRN: [id number] Date of Birth: 05/08/1946

## 2020-12-08 ENCOUNTER — Ambulatory Visit: Payer: Medicare Other | Admitting: Physical Therapy

## 2020-12-08 ENCOUNTER — Other Ambulatory Visit: Payer: Self-pay

## 2020-12-08 ENCOUNTER — Encounter: Payer: Self-pay | Admitting: Physical Therapy

## 2020-12-08 DIAGNOSIS — R6889 Other general symptoms and signs: Secondary | ICD-10-CM

## 2020-12-08 DIAGNOSIS — M6281 Muscle weakness (generalized): Secondary | ICD-10-CM | POA: Diagnosis not present

## 2020-12-08 DIAGNOSIS — G8929 Other chronic pain: Secondary | ICD-10-CM | POA: Diagnosis not present

## 2020-12-08 DIAGNOSIS — M545 Low back pain, unspecified: Secondary | ICD-10-CM

## 2020-12-08 NOTE — Therapy (Signed)
[place], [place], [address] Phone: [phone]   Fax:  [phone]  Physical Therapy Treatment  Patient Details  Name: Hannah Rojas MRN: [id number] Date of Birth: May 30, 1946 Referring Provider (PT): [name]   Encounter Date: 12/08/2020   PT End of Session - 12/08/20 1611    Visit Number 3    Number of Visits 12    Date for PT Re-Evaluation 01/05/21    PT Start Time 4270    PT Stop Time 1654    PT Time Calculation (min) 47 min    Activity Tolerance Patient tolerated treatment well    Behavior During Therapy [facility] for tasks assessed/performed           Past Medical History:  Diagnosis Date  . Cataracts, bilateral    bilateral  . H. pylori infection    history  . Pancreatic cyst    hx mucinous cystoadenoma    Past Surgical History:  Procedure Laterality Date  . CHOLECYSTECTOMY    . ESOPHAGOGASTRODUODENOSCOPY    . PANCREATIC CYST EXCISION    . spleenectomy      There were no vitals filed for this visit.   Subjective Assessment - 12/08/20 1609    Subjective "Today has been terrible".  Bending over has been painful.  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 Yes    Pain Score 5     Pain Location Buttocks    Pain Orientation Right    Pain Descriptors / Indicators Sore    Aggravating Factors  bending    Pain Relieving Factors analgesic cream.              [facility] PT Assessment - 12/08/20 1611      Assessment   Medical Diagnosis scoliosis    Referring Provider (PT) [name]    Onset Date/Surgical Date 10/19/20    Next MD Visit PRN            [facility] Adult PT Treatment/Exercise - 12/08/20 0001      Self-Care   Other Self-Care Comments  reviewed self massage with ball to hip musculature.      Lumbar Exercises: Stretches    Passive Hamstring Stretch Right;Left;2 reps;30 seconds   hooklying with strap   Figure 4 Stretch 1 rep;20 seconds   seated; tight/ discomfort in adductors   Figure 4 Stretch Limitations taken out of HEP due to increased irritation    Other Lumbar Stretch Exercise lower trunk rotation x 3 reps with 10 second hold at end range    Other Lumbar Stretch Exercise butterfly stretch x 20 sec x 2 (supine)      Lumbar Exercises: Aerobic   Nustep L4: 4 min for warm up      Lumbar Exercises: Supine   Bridge 10 reps    Bridge Limitations pain on 1st rep in low back; subsided with additional reps.      Lumbar Exercises: Sidelying   Clam Right;10 reps;2 seconds   tactile cues to not roll back/ isolate motion to hip, not back     Modalities   Modalities Electrical Stimulation;Moist Heat      Moist Heat Therapy   Number Minutes Moist Heat 10 Minutes    Moist Heat Location Lumbar Spine;Hip      Electrical  Stimulation   Electrical Stimulation Location Rt glute    Electrical Stimulation Action IFC    Electrical Stimulation Parameters 10 min, intensity to tolerance    Electrical Stimulation Goals Pain      Manual Therapy   Manual therapy comments gentle TPR to Rt glute med/ piriformis during clam exercise.                  PT Education - 12/08/20 1655    Education Details HEP, TENS, posture and body mechanics.    Person(s) Educated Patient    Methods Explanation;Handout;Verbal cues;Demonstration    Comprehension Verbalized understanding;Returned demonstration               PT Long Term Goals - 11/24/20 1606      PT LONG TERM GOAL #1   Title Pt will be independent with HEP    Time 66    Period Weeks    Status New    Target Date 01/05/21      PT LONG TERM GOAL #2   Title Pt will improve bilat hip strength to 4/5 to perform work duties with decreased pain    Time 6    Period Weeks    Status New    Target Date 01/05/21      PT LONG TERM GOAL #3   Title Pt will improve  lumbar ROM to [facility] to perform bending activities with decreased pain    Time 6    Period Weeks    Status New    Target Date 01/05/21      PT LONG TERM GOAL #4   Title Pt will tolerate sitting x 1 hour with pain <= 1/10    Time 6    Period Weeks    Status New    Target Date 01/05/21                 Plan - 12/08/20 1643    Clinical Impression Statement Pt arrived with elevated pain level in Rt glute/ SI area, agrivated with forward trunk flexion.  Encouraged pt to bend knees and hips to reach items on floor.  Pt reported less pain at end of session after exercises, and further reduction of pain with MHP/ estim. Goals are ongoing.    Personal Factors and Comorbidities Age;Time since onset of injury/illness/exacerbation    Examination-Activity Limitations Bend;Carry;Sit;Lift    Examination-Participation Restrictions Occupation    Stability/Clinical Decision Making Stable/Uncomplicated    Rehab Potential Good    PT Frequency 2x / week    PT Duration 6 weeks    PT Treatment/Interventions Dry needling;Taping;Passive range of motion;Manual techniques;Patient/family education;Therapeutic activities;Therapeutic exercise;Neuromuscular re-education;Cryotherapy;Aquatic Therapy;ADLs/Self Care Home Management;Electrical Stimulation;Iontophoresis 4mg /ml Dexamethasone;Moist Heat;Ultrasound    PT Next Visit Plan add hip and core strength, manual and modalties if needed    PT Home Exercise Plan Access Code: [email or web address]    Consulted and Agree with Plan of Care Patient           Patient will benefit from skilled therapeutic intervention in order to improve the following deficits and impairments:  Abnormal gait,Increased muscle spasms,Decreased range of motion,Pain,Impaired flexibility,Decreased activity tolerance,Decreased strength,Hypomobility  Visit Diagnosis: Chronic right-sided low back pain without sciatica  Muscle weakness (generalized)  Decreased activity tolerance     Problem  List Patient Active Problem List   Diagnosis Date Noted  . Seasonal allergies 03/29/2020  . History of iron deficiency 03/29/2020  . Hyperlipidemia associated with type 2 diabetes mellitus ([place]) 03/13/2016  . Obesity (BMI  30-39.9) 11/19/2013  . 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 Pancreatic cyst 07/12/2011  . Diabetes mellitus, type II ([place]) 05/17/2007  . KIDNEY DISEASE, CHRONIC, STAGE III 05/17/2007  . ABNORMAL SERUM ENZYME LEVEL NEC 05/16/2007  . 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 SCOLIOSIS 06/26/2006   Kerin Perna, PTA 12/08/20 [facility] [place], [place], [address] Phone: [phone]   Fax:  [phone]  Name: Hannah Rojas MRN: [id number] Date of Birth: Jul 08, 1946

## 2020-12-08 NOTE — Patient Instructions (Addendum)
TENS UNIT: This is helpful for muscle pain and spasm.   Search and Purchase a TENS 7000 2nd edition at www.tenspros.com. It should be less than $30.     TENS unit instructions: Do not shower or bathe with the unit on Turn the unit off before removing electrodes or batteries If the electrodes lose stickiness add a drop of water to the electrodes after they are disconnected from the unit and place on plastic sheet. If you continued to have difficulty, call the TENS unit company to purchase more electrodes. Do not apply lotion on the skin area prior to use. Make sure the skin is clean and dry as this will help prolong the life of the electrodes. After use, always check skin for unusual red areas, rash or other skin difficulties. If there are any skin problems, does not apply electrodes to the same area. Never remove the electrodes from the unit by pulling the wires. Do not use the TENS unit or electrodes other than as directed. Do not change electrode placement without consultating your therapist or physician. Keep 2 fingers with between each electrode. Wear time ratio is 2:1, on to off times.    For example on for 30 minutes off for 15 minutes and then on for 30 minutes off for 15 minutes  Sleeping on Back  Place pillow under knees. A pillow with cervical support and a roll around waist are also helpful. Copyright  VHI. All rights reserved.  Sleeping on Side Place pillow between knees. Use cervical support under neck and a roll around waist as needed. Copyright  VHI. All rights reserved.   Sleeping on Stomach   If this is the only desirable sleeping position, place pillow under lower legs, and under stomach or chest as needed.  Posture - Sitting   Sit upright, head facing forward. Try using a roll to support lower back. Keep shoulders relaxed, and avoid rounded back. Keep hips level with knees. Avoid crossing legs for long periods. Stand to Sit / Sit to Stand   To sit: Bend  knees to lower self onto front edge of chair, then scoot back on seat. To stand: Reverse sequence by placing one foot forward, and scoot to front of seat. Use rocking motion to stand up.   Work Height and Reach  Ideal work height is no more than 2 to 4 inches below elbow level when standing, and at elbow level when sitting. Reaching should be limited to arm's length, with elbows slightly bent.  Bending  Bend at hips and knees, not back. Keep feet shoulder-width apart.    Posture - Standing   Good posture is important. Avoid slouching and forward head thrust. Maintain curve in low back and align ears over shoul- ders, hips over ankles.  Alternating Positions   Alternate tasks and change positions frequently to reduce fatigue and muscle tension. Take rest breaks. Computer Work   Position work to Programmer, multimedia. Use proper work and seat height. Keep shoulders back and down, wrists straight, and elbows at right angles. Use chair that provides full back support. Add footrest and lumbar roll as needed.  Getting Into / Out of Car  Lower self onto seat, scoot back, then bring in one leg at a time. Reverse sequence to get out.  Dressing  Lie on back to pull socks or slacks over feet, or sit and bend leg while keeping back straight.    [place] one foot on ledge of cabinet under  sink when standing at sink for prolonged periods.   Pushing / Pulling  Pushing is preferable to pulling. Keep back in proper alignment, and use leg muscles to do the work.  Deep Squat   Squat and lift with both arms held against upper trunk. Tighten stomach muscles without holding breath. Use smooth movements to avoid jerking.  Avoid Twisting   Avoid twisting or bending back. Pivot around using foot movements, and bend at knees if needed when reaching for articles.  Carrying Luggage   Distribute weight evenly on both sides. Use a cart whenever possible. Do not twist trunk. Move body  as a unit.   Lifting Principles .Maintain proper posture and head alignment. .Slide object as close as possible before lifting. .Move obstacles out of the way. .Test before lifting; ask for help if too heavy. .Tighten stomach muscles without holding breath. .Use smooth movements; do not jerk. .Use legs to do the work, and pivot with feet. .Distribute the work load symmetrically and close to the center of trunk. .Push instead of pull whenever possible.   Ask For Help   Ask for help and delegate to others when possible. Coordinate your movements when lifting together, and maintain the low back curve.  Log Roll   Lying on back, bend left knee and place left arm across chest. Roll all in one movement to the right. Reverse to roll to the left. Always move as one unit. Housework - Sweeping  Use long-handled equipment to avoid stooping.   Housework - Wiping  Position yourself as close as possible to reach work surface. Avoid straining your back.  Laundry - Unloading Wash   To unload small items at bottom of washer, lift leg opposite to arm being used to reach.  [place] close to area to be raked. Use arm movements to do the work. Keep back straight and avoid twisting.     Cart  When reaching into cart with one arm, lift opposite leg to keep back straight.   Getting Into / Out of Bed  Lower self to lie down on one side by raising legs and lowering head at the same time. Use arms to assist moving without twisting. Bend both knees to roll onto back if desired. To sit up, start from lying on side, and use same move-ments in reverse. Housework - Vacuuming  Hold the vacuum with arm held at side. Step back and forth to move it, keeping head up. Avoid twisting.   Laundry - IT consultant so that bending and twisting can be avoided.   Laundry - Unloading Dryer  Squat down to reach into clothes dryer or use a reacher.  Gardening -  Weeding / Probation officer or Kneel. Knee pads may be helpful.

## 2020-12-15 ENCOUNTER — Ambulatory Visit: Payer: Medicare Other | Admitting: Physical Therapy

## 2020-12-15 ENCOUNTER — Other Ambulatory Visit: Payer: Self-pay

## 2020-12-15 DIAGNOSIS — G8929 Other chronic pain: Secondary | ICD-10-CM | POA: Diagnosis not present

## 2020-12-15 DIAGNOSIS — M545 Low back pain, unspecified: Secondary | ICD-10-CM

## 2020-12-15 DIAGNOSIS — M6281 Muscle weakness (generalized): Secondary | ICD-10-CM | POA: Diagnosis not present

## 2020-12-15 DIAGNOSIS — R6889 Other general symptoms and signs: Secondary | ICD-10-CM | POA: Diagnosis not present

## 2020-12-15 NOTE — Therapy (Addendum)
[place], [place], [address] Phone: [phone]   Fax:  [phone]  Physical Therapy Treatment  Patient Details  Name: Hannah Rojas MRN: [id number] Date of Birth: 11-Mar-1946 Referring Provider (PT): [name]   Encounter Date: 12/15/2020   PT End of Session - 12/15/20 1644    Visit Number 4    Number of Visits 12    Date for PT Re-Evaluation 01/05/21    PT Start Time 1600    PT Stop Time 1642    PT Time Calculation (min) 42 min    Activity Tolerance Patient tolerated treatment well    Behavior During Therapy [facility] for tasks assessed/performed           Past Medical History:  Diagnosis Date  . Cataracts, bilateral    bilateral  . H. pylori infection    history  . Pancreatic cyst    hx mucinous cystoadenoma    Past Surgical History:  Procedure Laterality Date  . CHOLECYSTECTOMY    . ESOPHAGOGASTRODUODENOSCOPY    . PANCREATIC CYST EXCISION    . spleenectomy      There were no vitals filed for this visit.   Subjective Assessment - 12/15/20 1604    Subjective Pt states she has been having heel pain in her Lt. Her back is feeling "Not so bad" Pt states she has not gotten home TENs yet    Patient Stated Goals be able to work and perform IADLs without pain    Currently in Pain? Yes    Pain Score 3     Pain Location Back    Pain Orientation Lower    Pain Descriptors / Indicators Aching                             [facility] Adult PT Treatment/Exercise - 12/15/20 0001      Lumbar Exercises: Stretches   Passive Hamstring Stretch Right;Left;2 reps;30 seconds    Figure 4 Stretch 2 reps;20 seconds    Figure 4 Stretch Limitations cues for technique    Other Lumbar Stretch Exercise lower trunk rotation x 10 with 3 sec hold each side    Other Lumbar Stretch Exercise butterfly stretch 2 x 20 sec      Lumbar Exercises: Aerobic   Nustep L5 x 4 min for warm up       Lumbar Exercises: Standing   Other Standing Lumbar Exercises resisted walking backward x 10 with cues for core contraction      Lumbar Exercises: Seated   Sit to Stand 20 reps    Sit to Stand Limitations just tapping bottom on mat, not full sit      Lumbar Exercises: Supine   Clam 10 reps    Clam Limitations red TB    Bridge 10 reps    Bridge Limitations no pain this session    Bridge with clamshell 10 reps      Lumbar Exercises: Sidelying   Clam Both;20 reps                       PT Long Term Goals - 11/24/20 1606      PT LONG TERM GOAL #1   Title Pt will be independent with HEP    Time 66    Period Weeks    Status New    Target Date 01/05/21      PT LONG TERM  GOAL #2   Title Pt will improve bilat hip strength to 4/5 to perform work duties with decreased pain    Time 6    Period Weeks    Status New    Target Date 01/05/21      PT LONG TERM GOAL #3   Title Pt will improve lumbar ROM to [facility] to perform bending activities with decreased pain    Time 6    Period Weeks    Status New    Target Date 01/05/21      PT LONG TERM GOAL #4   Title Pt will tolerate sitting x 1 hour with pain <= 1/10    Time 6    Period Weeks    Status New    Target Date 01/05/21                 Plan - 12/15/20 1644    Clinical Impression Statement Pt declinces estim and manual therapy today due to "My back feels good". Session focused on core strengthening and stretching exercises. Pt fatigues quickly with exercise progression but no c/o pain.    PT Next Visit Plan continue to progress hip and core strength, manual and modalities as indicated, update HEP    PT Home Exercise Plan Access Code: [email or web address]    Consulted and Agree with Plan of Care Patient           Patient will benefit from skilled therapeutic intervention in order to improve the following deficits and impairments:     Visit Diagnosis: Chronic right-sided low back pain without sciatica  Muscle weakness  (generalized)  Decreased activity tolerance     Problem List Patient Active Problem List   Diagnosis Date Noted  . Seasonal allergies 03/29/2020  . History of iron deficiency 03/29/2020  . Hyperlipidemia associated with type 2 diabetes mellitus ([place]) 03/13/2016  . Obesity (BMI 30-39.9) 11/19/2013  . 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 Pancreatic cyst 07/12/2011  . Diabetes mellitus, type II ([place]) 05/17/2007  . KIDNEY DISEASE, CHRONIC, STAGE III 05/17/2007  . ABNORMAL SERUM ENZYME LEVEL NEC 05/16/2007  . 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 SCOLIOSIS 06/26/2006   Edwin Baines, PT  Jakerra Floyd 12/15/2020, 4:46 PM   [facility] [place], [place], [address] Phone: [phone]   Fax:  [phone]  Name: Hannah Rojas MRN: [id number] Date of Birth: 1945/10/22

## 2020-12-22 ENCOUNTER — Ambulatory Visit (INDEPENDENT_AMBULATORY_CARE_PROVIDER_SITE_OTHER): Payer: Medicare Other | Admitting: Physical Therapy

## 2020-12-22 ENCOUNTER — Other Ambulatory Visit: Payer: Self-pay

## 2020-12-22 DIAGNOSIS — R6889 Other general symptoms and signs: Secondary | ICD-10-CM

## 2020-12-22 DIAGNOSIS — M545 Low back pain, unspecified: Secondary | ICD-10-CM

## 2020-12-22 DIAGNOSIS — G8929 Other chronic pain: Secondary | ICD-10-CM | POA: Diagnosis not present

## 2020-12-22 DIAGNOSIS — M6281 Muscle weakness (generalized): Secondary | ICD-10-CM

## 2020-12-22 NOTE — Therapy (Addendum)
[place], [place], [address] Phone: [phone]   Fax:  [phone]  Physical Therapy Treatment  Patient Details  Name: Hannah Rojas MRN: [id number] Date of Birth: 09-Oct-1945 Referring Provider (PT): [name]   Encounter Date: 12/22/2020   PT End of Session - 12/22/20 1641    Visit Number 5    Number of Visits 12    Date for PT Re-Evaluation 01/05/21    PT Start Time 6962    PT Stop Time 1639    PT Time Calculation (min) 34 min    Activity Tolerance Patient tolerated treatment well    Behavior During Therapy [facility] for tasks assessed/performed           Past Medical History:  Diagnosis Date  . Cataracts, bilateral    bilateral  . H. pylori infection    history  . Pancreatic cyst    hx mucinous cystoadenoma    Past Surgical History:  Procedure Laterality Date  . CHOLECYSTECTOMY    . ESOPHAGOGASTRODUODENOSCOPY    . PANCREATIC CYST EXCISION    . spleenectomy      There were no vitals filed for this visit.   Subjective Assessment - 12/22/20 1610    Subjective "every now and then I get a pain in the back if I stoop wrong".  Biggest complaint is Lt heel; has intermittent pain after standing a long time.  "It (Back) feels a lot better".  Pt reporting significant improvement in back pain since starting therapy; able to sit longer and no longer has back pain upon laying down.    Patient Stated Goals be able to work and perform IADLs without pain    Currently in Pain? No/denies    Pain Score 0-No pain    Pain Location Back    Pain Orientation Lower              [facility] PT Assessment - 12/22/20 0001      Assessment   Medical Diagnosis scoliosis    Referring Provider (PT) [name]    Onset Date/Surgical Date 10/19/20    Next MD Visit PRN      AROM   Lumbar Flexion WNL    Lumbar Extension WNL    Lumbar - Right Side Bend WNL    Lumbar - Left Side Bend WNL      Strength    Right Hip Flexion 4/5    Right Hip Extension 3+/5    Right Hip ABduction 4/5    Left Hip Flexion 4+/5    Left Hip Extension 3/5    Left Hip ABduction 4/5            [facility] Adult PT Treatment/Exercise - 12/22/20 0001      Lumbar Exercises: Stretches   Passive Hamstring Stretch Right;Left;2 reps;30 seconds    Lower Trunk Rotation 3 reps;10 seconds    Gastroc Stretch Right;Left;3 reps;30 seconds    Gastroc Stretch Limitations trial with heel off of step, not tolerated.  runners stretch holding counter tolerated well.    Other Lumbar Stretch Exercise butterfly stretch 2 x 20 sec      Lumbar Exercises: Aerobic   Nustep L5 x 5 min for warm up      Lumbar Exercises: Supine   Bridge 10 reps;5 seconds      Lumbar Exercises: Sidelying   Clam Both;10 reps      Modalities   Modalities --   pt pain free; deferred.  verbally reviewed remainder of HEP; pt verbalized understanding.      PT Long Term Goals - 12/22/20 1615      PT LONG TERM GOAL #1   Title Pt will be independent with HEP    Time 66    Period Weeks    Status Achieved      PT LONG TERM GOAL #2   Title Pt will improve bilat hip strength to 4/5 to perform work duties with decreased pain    Time 6    Period Weeks    Status Partially Met      PT LONG TERM GOAL #3   Title Pt will improve lumbar ROM to [facility] to perform bending activities with decreased pain    Time 6    Period Weeks    Status Achieved      PT LONG TERM GOAL #4   Title Pt will tolerate sitting x 1 hour with pain <= 1/10    Time 6    Period Weeks    Status Achieved                 Plan - 12/22/20 1639    Clinical Impression Statement Pt demonstrated improved in lumbar ROM and hip strength.  Pt tolerated all exercises well, reporting increased tightness in Lt calf vs Rt.  She has met all goals except strength (partially met due to weakness in hip extensors). Pt verbalized readiness to d/c to HEP today.    PT Frequency 2x / week     PT Treatment/Interventions Dry needling;Taping;Passive range of motion;Manual techniques;Patient/family education;Therapeutic activities;Therapeutic exercise;Neuromuscular re-education;Cryotherapy;Aquatic Therapy;ADLs/Self Care Home Management;Electrical Stimulation;Iontophoresis 88m/ml Dexamethasone;Moist Heat;Ultrasound    PT Next Visit Plan spoke to supervising PT; will d/c.    PT Home Exercise Plan Access Code: [email or web address]   Consulted and Agree with Plan of Care Patient           Patient will benefit from skilled therapeutic intervention in order to improve the following deficits and impairments:  Abnormal gait,Increased muscle spasms,Decreased range of motion,Pain,Impaired flexibility,Decreased activity tolerance,Decreased strength,Hypomobility  Visit Diagnosis: Chronic right-sided low back pain without sciatica  Muscle weakness (generalized)  Decreased activity tolerance     Problem List Patient Active Problem List   Diagnosis Date Noted  . Seasonal allergies 03/29/2020  . History of iron deficiency 03/29/2020  . Hyperlipidemia associated with type 2 diabetes mellitus ([place] 03/13/2016  . Obesity (BMI 30-39.9) 11/19/2013  . 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 Pancreatic cyst 07/12/2011  . Diabetes mellitus, type II ([place] 05/17/2007  . KIDNEY DISEASE, CHRONIC, STAGE III 05/17/2007  . ABNORMAL SERUM ENZYME LEVEL NEC 05/16/2007  . 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 SCOLIOSIS 06/26/2006   PHYSICAL THERAPY DISCHARGE SUMMARY  Visits from Start of Care: 5  Current functional level related to goals / functional outcomes: Pt has improved strength and standing and sitting tolerance   Remaining deficits: See above   Education / Equipment: HEP Plan: Patient agrees to discharge.  Patient goals were partially met. Patient is being discharged due to being pleased with the current functional level.  ?????      KIsabelle Course PT,DPT04/19/2211:33 AM  JKerin Perna PTA 12/22/20 4:44 PM  [facility] [place] [address]Phone: [phone]  Fax:  [phone] Name: Hannah LAUDERBAUGHMRN: [id number] of Birth: [birth year]

## 2020-12-22 NOTE — Patient Instructions (Signed)
Access Code: [email or web address] URL: [email or web address] Date: 12/22/2020 Prepared by: [place]  Exercises Hooklying Hamstring Stretch with Strap - 1 x daily - 7 x weekly - 3 sets - 1 reps - 20-30 seconds hold Seated Hamstring Stretch - 1 x daily - 7 x weekly - 3 sets - 1 reps - 20-30 seonds hold Supine Lower Trunk Rotation - 1 x daily - 7 x weekly - 2 sets - 10 reps Seated Hip Flexor Stretch - 1 x daily - 7 x weekly - 3 sets - 1 reps - 20-30 seconds hold Supine Butterfly Groin Stretch - 2 x daily - 7 x weekly - 1 sets - 2 reps - 15-30 seconds hold Supine Bridge - 1 x daily - 3 x weekly - 2 sets - 10 reps Clamshell - 1 x daily - 3 x weekly - 2 sets - 10 reps Gastroc Stretch on Wall - 2 x daily - 7 x weekly - 1 sets - 2-3 reps - 10-30 seconds hold

## 2020-12-27 ENCOUNTER — Ambulatory Visit (INDEPENDENT_AMBULATORY_CARE_PROVIDER_SITE_OTHER): Payer: Medicare Other | Admitting: Medical-Surgical

## 2020-12-27 ENCOUNTER — Other Ambulatory Visit: Payer: Self-pay

## 2020-12-27 VITALS — BP 128/77 | HR 92 | Resp 16 | Ht 61.0 in | Wt 157.0 lb

## 2020-12-27 DIAGNOSIS — Z0000 Encounter for general adult medical examination without abnormal findings: Secondary | ICD-10-CM

## 2020-12-27 DIAGNOSIS — Z780 Asymptomatic menopausal state: Secondary | ICD-10-CM

## 2020-12-27 DIAGNOSIS — Z1231 Encounter for screening mammogram for malignant neoplasm of breast: Secondary | ICD-10-CM | POA: Diagnosis not present

## 2020-12-27 NOTE — Patient Instructions (Addendum)
[place] Maintenance Summary and Written Plan of Care  Ms. Lookabaugh ,  Thank you for allowing me to perform your Medicare Annual Wellness Visit and for your ongoing commitment to your health.   Health Maintenance & Immunization History Health Maintenance  Topic Date Due  . OPHTHALMOLOGY EXAM  12/27/2020 (Originally 11/30/2020)  . FOOT EXAM  12/27/2021 (Originally 12/21/2020)  . DEXA SCAN  12/27/2021 (Originally 12/27/2019)  . INFLUENZA VACCINE  04/18/2021  . HEMOGLOBIN A1C  05/01/2021  . MAMMOGRAM  05/28/2021  . COLONOSCOPY (Pts 45-3yrs Insurance coverage will need to be confirmed)  05/08/2022  . TETANUS/TDAP  01/26/2027  . COVID-19 Vaccine  Completed  . Hepatitis C Screening  Completed  . PNA vac Low Risk Adult  Completed  . HPV VACCINES  Aged Out   Immunization History  Administered Date(s) Administered  . Fluad Quad(high Dose 65+) 06/23/2019  . H1N1 07/17/2008  . Influenza Split 06/10/2012  . Influenza Whole 08/27/2007, 06/09/2008, 06/18/2009, 07/13/2009, 06/20/2010  . Influenza, High Dose Seasonal PF 06/12/2017, 06/19/2018, 07/02/2020  . Influenza,inj,Quad PF,6+ Mos 05/12/2013, 05/21/2014, 06/03/2015, 06/15/2016  . Moderna Sars-Covid-2 Vaccination 12/26/2019, 01/17/2020, 08/16/2020  . Pneumococcal Conjugate-13 12/02/2014  . Pneumococcal Polysaccharide-23 08/27/2007, 11/20/2011  . Td 06/19/2005  . Tdap 01/25/2017  . Zoster 07/20/2010    These are the patient goals that we discussed: Goals Addressed              This Visit's Progress   .  Patient Stated (pt-stated)        12/27/2020 AWV Goal: Improved Nutrition/Diet  . Patient will verbalize understanding that diet plays an important role in overall health and that a poor diet is a risk factor for many chronic medical conditions.  . Over the next year, patient will improve self management of their diet by incorporating better food choices. . Patient will utilize available community  resources to help with food acquisition if needed (ex: food pantries, [address], etc) . Patient will work with nutrition specialist if a referral was made         This is a list of Health Maintenance Items that are overdue or due now: Screening mammography Bone densitometry screening Eye exam, Foot exam, Shingles vaccine  Orders/Referrals Placed Today: Orders Placed This Encounter  Procedures  . DEXAScan    Standing Status:   Future    Standing Expiration Date:   12/27/2021    Scheduling Instructions:     Please call patient to schedule    Order Specific Question:   Reason for exam:    Answer:   Post Menopausal    Order Specific Question:   Preferred imaging location?    Answer:   [facility]  . Mammogram 3D SCREEN BREAST BILATERAL    Standing Status:   Future    Standing Expiration Date:   12/27/2021    Scheduling Instructions:     Please call patient to schedule    Order Specific Question:   Reason for Exam (SYMPTOM  OR DIAGNOSIS REQUIRED)    Answer:   breast cancer screen    Order Specific Question:   Preferred imaging location?    Answer:   [facility]   (Contact our referral department at [phone] if you have not spoken with someone about your referral appointment within the next 5 days)    Follow-up Plan . Follow-up with Hali Marry, MD as planned . Foot exam can be done at your next in-office visit. . Schedule your  eye exam. . Referral for Bone density and Mammogram has been sent.  . Let us know if you decide to get the shingles vaccine.  . Medicare wellness in one year.

## 2020-12-27 NOTE — Progress Notes (Signed)
MEDICARE ANNUAL WELLNESS VISIT  12/27/2020  Subjective:  Hannah Rojas is a 75 y.o. female patient of [name], Rene Kocher, MD who had a Medicare Annual Wellness Visit today. Kiyoko is Working full time and lives alone. she has 4 children. she reports that she is socially active and does interact with friends/family regularly. she is moderately physically active and enjoys walking.  Patient Care Team: Hali Marry, MD as PCP - General  Advanced Directives 12/27/2020 01/28/2015  Does Patient Have a Medical Advance Directive? No No  Would patient like information on creating a medical advance directive? No - Patient declined Yes - Educational materials given    Hospital Utilization Over the Past 12 Months: # of hospitalizations or ER visits: 0 # of surgeries: 0  Review of Systems    Patient reports that her overall health is unchanged when compared to last year.  Review of Systems: History obtained from chart review and the patient  All other systems negative.  Pain Assessment Pain : No/denies pain     Current Medications & Allergies (verified) Allergies as of 12/27/2020      Reactions   Ibuprofen Rash   Iodinated Diagnostic Agents Rash   IV Contrast   Sulfa Antibiotics Rash   Atorvastatin Other (See Comments)   HA   Naproxen Sodium    Pravastatin Other (See Comments)   dizziness   Simvastatin Other (See Comments)   Nightmares   Sulfonamide Derivatives    REACTION: rash   Trazodone And Nefazodone Other (See Comments)   Nightmares      Medication List       Accurate as of December 27, 2020 10:56 AM. If you have any questions, ask your nurse or doctor.        blood glucose meter kit and supplies Dispense based on patient and insurance preference. Use up to four times daily as directed. Dx:E11.9   Levemir FlexTouch 100 UNIT/ML FlexPen Generic drug: insulin detemir Inject 10-20 Units into the skin at bedtime. What changed: additional  instructions   lisinopril 2.5 MG tablet Commonly known as: ZESTRIL Take 1 tablet (2.5 mg total) by mouth daily.   ONE TOUCH ULTRA TEST test strip Generic drug: glucose blood USE 1 STRIP TO CHECK GLUCOSE UP TO 4 TIMES DAILY AS DIRECTED.   predniSONE 20 MG tablet Commonly known as: DELTASONE Take 2 tablets (40 mg total) by mouth daily with breakfast.   rosuvastatin 20 MG tablet Commonly known as: CRESTOR TAKE 1 TABLET BY MOUTH  DAILY AT BEDTIME   TYLENOL 8 HOUR ARTHRITIS PAIN PO Take 1 tablet by mouth at bedtime as needed.   Xigduo XR 06-999 MG Tb24 Generic drug: Dapagliflozin-metFORMIN HCl ER TAKE 1 TABLET BY MOUTH  DAILY WITH BREAKFAST       History (reviewed): Past Medical History:  Diagnosis Date  . Cataracts, bilateral    bilateral  . H. pylori infection    history  . Pancreatic cyst    hx mucinous cystoadenoma   Past Surgical History:  Procedure Laterality Date  . CHOLECYSTECTOMY    . ESOPHAGOGASTRODUODENOSCOPY    . PANCREATIC CYST EXCISION    . spleenectomy     History reviewed. No pertinent family history. Social History   Socioeconomic History  . Marital status: Widowed    Spouse name: Not on file  . Number of children: 4  . Years of education: 8th grade  . Highest education level: 8th grade  Occupational History  . Occupation: Psychologist, counselling  Employer: [employer]    Comment: Full time  Tobacco Use  . Smoking status: Never Smoker  . Smokeless tobacco: Never Used  Substance and Sexual Activity  . Alcohol use: No  . Drug use: Not on file  . Sexual activity: Not on file  Other Topics Concern  . Not on file  Social History Narrative   Lives alone. She takes care of grandchildren through out the week. She likes to go for walks in her free time.   Social Determinants of Health   Financial Resource Strain: Low Risk   . Difficulty of Paying Living Expenses: Not hard at all  Food Insecurity: No Food Insecurity  . Worried About Charity fundraiser  in the Last Year: Never true  . Ran Out of Food in the Last Year: Never true  Transportation Needs: No Transportation Needs  . Lack of Transportation (Medical): No  . Lack of Transportation (Non-Medical): No  Physical Activity: Insufficiently Active  . Days of Exercise per Week: 1 day  . Minutes of Exercise per Session: 20 min  Stress: No Stress Concern Present  . Feeling of Stress : Not at all  Social Connections: Moderately Isolated  . Frequency of Communication with Friends and Family: More than three times a week  . Frequency of Social Gatherings with Friends and Family: Once a week  . Attends Religious Services: More than 4 times per year  . Active Member of Clubs or Organizations: No  . Attends Archivist Meetings: Never  . Marital Status: Widowed    Activities of Daily Living In your present state of health, do you have any difficulty performing the following activities: 12/27/2020  Hearing? N  Vision? N  Difficulty concentrating or making decisions? N  Walking or climbing stairs? N  Dressing or bathing? N  Doing errands, shopping? N  Preparing Food and eating ? N  Using the Toilet? N  In the past six months, have you accidently leaked urine? N  Do you have problems with loss of bowel control? N  Managing your Medications? N  Managing your Finances? N  Housekeeping or managing your Housekeeping? N  Some recent data might be hidden    Patient Education/Literacy How often do you need to have someone help you when you read instructions, pamphlets, or other written materials from your doctor or pharmacy?: 3 - Sometimes What is the last grade level you completed in school?: 8th grade  Exercise Current Exercise Habits: Home exercise routine, Type of exercise: Other - see comments (physical therapy exercises), Time (Minutes): 20, Frequency (Times/Week): 1, Weekly Exercise (Minutes/Week): 20, Intensity: Moderate, Exercise limited by: orthopedic  condition(s)  Diet Patient reports consuming 3 meals a day and 0 snack(s) a day Patient reports that her primary diet is: Regular Patient reports that she does have regular access to food.   Depression Screen PHQ 2/9 Scores 12/27/2020 12/22/2019 06/23/2019 06/19/2018 06/12/2017 01/25/2017 03/13/2016  PHQ - 2 Score 2 0 0 0 0 0 0  PHQ- 9 Score 3 - - - - - -     Fall Risk Fall Risk  12/27/2020 12/22/2019 06/23/2019 06/19/2018 06/12/2017  Falls in the past year? 0 0 0 No No  Number falls in past yr: 0 0 0 - -  Injury with Fall? 0 0 0 - -  Risk for fall due to : No Fall Risks No Fall Risks - - -  Follow up Falls evaluation completed - - - -  Objective:   BP 128/77 (BP Location: Right Arm, Patient Position: Sitting, Cuff Size: Normal)   Pulse 92   Resp 16   Ht _0  (1.549 m)   Wt 157 lb (71.2 kg)   SpO2 95%   BMI 29.66 kg/m   Last Weight  Most recent update: 12/27/2020 10:37 AM   Weight  71.2 kg (157 lb)            Body mass index is 29.66 kg/m.  Hearing/Vision  . [place] did not have difficulty with hearing/understanding during the face-to-face interview . Nishtha did not have difficulty with her vision during the face-to-face interview . Reports that she has not had a formal eye exam by an eye care professional within the past year . Reports that she has not had a formal hearing evaluation within the past year  Cognitive Function: 6CIT Screen 12/27/2020  What Year? 0 points  What month? 0 points  What time? 0 points  Count back from 20 2 points  Months in reverse 4 points  Repeat phrase 2 points  Total Score 8    Normal Cognitive Function Screening: No: Patient had her granddaughter with her and was distracted. (Normal:0-7, Significant for Dysfunction: >8)  Immunization & Health Maintenance Record Immunization History  Administered Date(s) Administered  . Fluad Quad(high Dose 65+) 06/23/2019  . H1N1 07/17/2008  . Influenza Split 06/10/2012  . Influenza Whole  08/27/2007, 06/09/2008, 06/18/2009, 07/13/2009, 06/20/2010  . Influenza, High Dose Seasonal PF 06/12/2017, 06/19/2018, 07/02/2020  . Influenza,inj,Quad PF,6+ Mos 05/12/2013, 05/21/2014, 06/03/2015, 06/15/2016  . Moderna Sars-Covid-2 Vaccination 12/26/2019, 01/17/2020, 08/16/2020  . Pneumococcal Conjugate-13 12/02/2014  . Pneumococcal Polysaccharide-23 08/27/2007, 11/20/2011  . Td 06/19/2005  . Tdap 01/25/2017  . Zoster 07/20/2010    Health Maintenance  Topic Date Due  . OPHTHALMOLOGY EXAM  12/27/2020 (Originally 11/30/2020)  . FOOT EXAM  12/27/2021 (Originally 12/21/2020)  . DEXA SCAN  12/27/2021 (Originally 12/27/2019)  . INFLUENZA VACCINE  04/18/2021  . HEMOGLOBIN A1C  05/01/2021  . MAMMOGRAM  05/28/2021  . COLONOSCOPY (Pts 45-57yr Insurance coverage will need to be confirmed)  05/08/2022  . TETANUS/TDAP  01/26/2027  . COVID-19 Vaccine  Completed  . Hepatitis C Screening  Completed  . PNA vac Low Risk Adult  Completed  . HPV VACCINES  Aged Out       Assessment  This is a routine wellness examination for [facility]  Health Maintenance: Due or Overdue There are no preventive care reminders to display for this patient.  PKirtland BouchardHolifield does not need a referral for CCommercial Metals CompanyAssistance: Care Management:   no Social Work:    no Prescription Assistance:  no Nutrition/Diabetes Education:  no   Plan:  Personalized Goals Goals Addressed              This Visit's Progress   .  Patient Stated (pt-stated)        12/27/2020 AWV Goal: Improved Nutrition/Diet  . Patient will verbalize understanding that diet plays an important role in overall health and that a poor diet is a risk factor for many chronic medical conditions.  . Over the next year, patient will improve self management of their diet by incorporating better food choices. . Patient will utilize available community resources to help with food acquisition if needed (ex: food pantries, [address],  etc) . Patient will work with nutrition specialist if a referral was made       Personalized Health Maintenance & Screening Recommendations  Screening  mammography Bone densitometry screening Eye exam, Foot exam, Shingles vaccine  Lung Cancer Screening Recommended: no (Low Dose CT Chest recommended if Age 15-80 years, 30 pack-year currently smoking OR have quit w/in past 15 years) Hepatitis C Screening recommended: no HIV Screening recommended: no  Advanced Directives: Written information was not given per the patient's request.  Referrals & Orders Orders Placed This Encounter  Procedures  . DEXAScan  . Mammogram 3D SCREEN BREAST BILATERAL    Follow-up Plan . Follow-up with Hali Marry, MD as planned . Foot exam can be done at your next in-office visit. . Schedule your eye exam. . Referral for Bone density and Mammogram has been sent.  . Let us know if you decide to get the shingles vaccine.  . Medicare wellness in one year.   I have personally reviewed and noted the following in the patient's chart:   . Medical and social history . Use of alcohol, tobacco or illicit drugs  . Current medications and supplements . Functional ability and status . Nutritional status . Physical activity . Advanced directives . List of other physicians . Hospitalizations, surgeries, and ER visits in previous 12 months . Vitals . Screenings to include cognitive, depression, and falls . Referrals and appointments  In addition, I have reviewed and discussed with patient certain preventive protocols, quality metrics, and best practice recommendations. A written personalized care plan for preventive services as well as general preventive health recommendations were provided to patient.     Tinnie Gens, RN  12/27/2020

## 2021-01-07 ENCOUNTER — Other Ambulatory Visit: Payer: Self-pay | Admitting: *Deleted

## 2021-01-07 ENCOUNTER — Other Ambulatory Visit: Payer: Self-pay | Admitting: Family Medicine

## 2021-01-07 DIAGNOSIS — E119 Type 2 diabetes mellitus without complications: Secondary | ICD-10-CM

## 2021-01-31 ENCOUNTER — Ambulatory Visit (INDEPENDENT_AMBULATORY_CARE_PROVIDER_SITE_OTHER): Payer: Medicare Other | Admitting: Family Medicine

## 2021-01-31 ENCOUNTER — Other Ambulatory Visit: Payer: Self-pay

## 2021-01-31 ENCOUNTER — Encounter: Payer: Self-pay | Admitting: Family Medicine

## 2021-01-31 VITALS — BP 109/61 | HR 72 | Ht 61.0 in | Wt 155.0 lb

## 2021-01-31 DIAGNOSIS — E119 Type 2 diabetes mellitus without complications: Secondary | ICD-10-CM

## 2021-01-31 DIAGNOSIS — N1831 Chronic kidney disease, stage 3a: Secondary | ICD-10-CM | POA: Diagnosis not present

## 2021-01-31 DIAGNOSIS — G479 Sleep disorder, unspecified: Secondary | ICD-10-CM | POA: Diagnosis not present

## 2021-01-31 LAB — POCT GLYCOSYLATED HEMOGLOBIN (HGB A1C): HbA1c, POC (controlled diabetic range): 7.7 % — AB (ref 0.0–7.0)

## 2021-01-31 NOTE — Patient Instructions (Addendum)
Increase Levemir to 20 units.  Call us with your blood sugars in about 2 weeks.   You need the new shingles vaccine, Shingrix IM.

## 2021-01-31 NOTE — Progress Notes (Signed)
Established Patient Office Visit  Subjective:  Patient ID: Hannah Rojas, female    DOB: 02/02/1946  Age: 75 y.o. MRN: [id number]  CC:  Chief Complaint  Patient presents with  . Diabetes    HPI Hannah Rojas presents for   Diabetes - no hypoglycemic events. No wounds or sores that are not healing well. No increased thirst or urination. Checking glucose at home. Taking medications as prescribed without any side effects.  Reports she has not been sleeping well since her granddaughter has been staying with her.  Past Medical History:  Diagnosis Date  . Cataracts, bilateral    bilateral  . H. pylori infection    history  . Pancreatic cyst    hx mucinous cystoadenoma    Past Surgical History:  Procedure Laterality Date  . CHOLECYSTECTOMY    . ESOPHAGOGASTRODUODENOSCOPY    . PANCREATIC CYST EXCISION    . spleenectomy      No family history on file.  Social History   Socioeconomic History  . Marital status: Widowed    Spouse name: Not on file  . Number of children: 4  . Years of education: 8th grade  . Highest education level: 8th grade  Occupational History  . 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 Smoking status: Never Smoker  . Smokeless tobacco: Never Used  Substance and Sexual Activity  . Alcohol use: No  . Drug use: Not on file  . Sexual activity: Not on file  Other Topics Concern  . Not on file  Social History Narrative   Lives alone. She takes care of grandchildren through out the week. She likes to go for walks in her free time.   Social Determinants of Health   Financial Resource Strain: Low Risk   . Difficulty of Paying Living Expenses: Not hard at all  Food Insecurity: No Food Insecurity  . Worried About Charity fundraiser in the Last Year: Never true  . Ran Out of Food in the Last Year: Never true  Transportation Needs: No Transportation Needs  . Lack of Transportation (Medical):  No  . Lack of Transportation (Non-Medical): No  Physical Activity: Insufficiently Active  . Days of Exercise per Week: 1 day  . Minutes of Exercise per Session: 20 min  Stress: No Stress Concern Present  . Feeling of Stress : Not at all  Social Connections: Moderately Isolated  . Frequency of Communication with Friends and Family: More than three times a week  . Frequency of Social Gatherings with Friends and Family: Once a week  . Attends Religious Services: More than 4 times per year  . Active Member of Clubs or Organizations: No  . Attends Archivist Meetings: Never  . Marital Status: Widowed  Intimate Partner Violence: Not At Risk  . Fear of Current or Ex-Partner: No  . Emotionally Abused: No  . Physically Abused: No  . Sexually Abused: No    Outpatient Medications Prior to Visit  Medication Sig Dispense Refill  . Acetaminophen (TYLENOL 8 HOUR ARTHRITIS PAIN PO) Take 1 tablet by mouth at bedtime as needed.    . blood glucose meter kit and supplies Dispense based on patient and insurance preference. Use up to four times daily as directed. Dx:E11.9 1 each 0  . LEVEMIR FLEXTOUCH 100 UNIT/ML FlexPen INJECT 10 TO 20 UNITS SUBCUTANEOUSLY AT BEDTIME (Patient taking differently: 14 Units.) 15 mL 3  .  lisinopril (ZESTRIL) 2.5 MG tablet Take 1 tablet (2.5 mg total) by mouth daily. 90 tablet 3  . ONE TOUCH ULTRA TEST test strip USE 1 STRIP TO CHECK GLUCOSE UP TO 4 TIMES DAILY AS DIRECTED. 300 each 11  . rosuvastatin (CRESTOR) 20 MG tablet TAKE 1 TABLET BY MOUTH  DAILY AT BEDTIME 90 tablet 3  . XIGDUO XR 06-999 MG TB24 TAKE 1 TABLET BY MOUTH  DAILY WITH BREAKFAST 90 tablet 3   No facility-administered medications prior to visit.    Allergies  Allergen Reactions  . Ibuprofen Rash  . Iodinated Diagnostic Agents Rash    IV Contrast  . Sulfa Antibiotics Rash  . Atorvastatin Other (See Comments)    HA  . Naproxen Sodium   . Pravastatin Other (See Comments)    dizziness  .  Simvastatin Other (See Comments)    Nightmares  . Sulfonamide Derivatives     REACTION: rash  . Trazodone And Nefazodone Other (See Comments)    Nightmares     ROS Review of Systems    Objective:    Physical Exam Constitutional:      Appearance: She is well-developed.  HENT:     Head: Normocephalic and atraumatic.  Cardiovascular:     Rate and Rhythm: Normal rate and regular rhythm.     Heart sounds: Normal heart sounds.  Pulmonary:     Effort: Pulmonary effort is normal.     Breath sounds: Normal breath sounds.  Skin:    General: Skin is warm and dry.  Neurological:     Mental Status: She is alert and oriented to person, place, and time.  Psychiatric:        Behavior: Behavior normal.     BP 109/61   Pulse 72   Ht 5' 1"  (1.549 m)   Wt 155 lb (70.3 kg)   SpO2 100%   BMI 29.29 kg/m  Wt Readings from Last 3 Encounters:  01/31/21 155 lb (70.3 kg)  12/27/20 157 lb (71.2 kg)  11/01/20 158 lb (71.7 kg)     Health Maintenance Due  Topic Date Due  . OPHTHALMOLOGY EXAM  11/30/2020    There are no preventive care reminders to display for this patient.  Lab Results  Component Value Date   TSH 2.18 03/29/2020   Lab Results  Component Value Date   WBC 10.9 (H) 03/29/2020   HGB 14.2 03/29/2020   HCT 42.1 03/29/2020   MCV 93.8 03/29/2020   PLT 293 03/29/2020   Lab Results  Component Value Date   NA 141 08/02/2020   K 4.7 08/02/2020   CO2 27 08/02/2020   GLUCOSE 99 08/02/2020   BUN 19 08/02/2020   CREATININE 1.00 (H) 08/02/2020   BILITOT 0.9 12/22/2019   ALKPHOS 63 02/07/2017   AST 21 12/22/2019   ALT 17 12/22/2019   PROT 7.8 12/22/2019   ALBUMIN 4.3 02/07/2017   CALCIUM 10.2 08/02/2020   Lab Results  Component Value Date   CHOL 157 12/22/2019   Lab Results  Component Value Date   HDL 74 12/22/2019   Lab Results  Component Value Date   LDLCALC 65 12/22/2019   Lab Results  Component Value Date   TRIG 93 12/22/2019   Lab Results   Component Value Date   CHOLHDL 2.1 12/22/2019   Lab Results  Component Value Date   HGBA1C 7.7 (A) 01/31/2021      Assessment & Plan:   Problem List Items Addressed This Visit  Endocrine   Diabetes mellitus, type II ([place]) - Primary    Uncontrolled. Increase Levemir to 20 units.  She was quite frustrated today and realizing her A1c had actually gone up.  We really discussed again cutting back on carb intake.  And trying to stay active.      Relevant Orders   POCT glycosylated hemoglobin (Hb A1C) (Completed)   COMPLETE METABOLIC PANEL WITH GFR   Lipid panel   CBC     Genitourinary   KIDNEY DISEASE, CHRONIC, STAGE III    Following renal function every 6 months.      Relevant Orders   COMPLETE METABOLIC PANEL WITH GFR   Lipid panel   CBC    Other Visit Diagnoses    Sleep disturbance          Sleep Disturbance-recommend a trial of melatonin.  Trying to have a consistent bedtime and wake time.  Also using things like white noise etc.  Lab Results  Component Value Date   HGBA1C 7.7 (A) 01/31/2021     No orders of the defined types were placed in this encounter.   Follow-up: No follow-ups on file.    Beatrice Lecher, MD

## 2021-01-31 NOTE — Assessment & Plan Note (Signed)
Following renal function every 6 months. 

## 2021-01-31 NOTE — Assessment & Plan Note (Addendum)
Uncontrolled. Increase Levemir to 20 units.  She was quite frustrated today and realizing her A1c had actually gone up.  We really discussed again cutting back on carb intake.  And trying to stay active.

## 2021-02-01 LAB — CBC
HCT: 43.8 % (ref 35.0–45.0)
Hemoglobin: 14.6 g/dL (ref 11.7–15.5)
MCH: 30.4 pg (ref 27.0–33.0)
MCHC: 33.3 g/dL (ref 32.0–36.0)
MCV: 91.1 fL (ref 80.0–100.0)
MPV: 11.6 fL (ref 7.5–12.5)
Platelets: 259 10*3/uL (ref 140–400)
RBC: 4.81 10*6/uL (ref 3.80–5.10)
RDW: 13.3 % (ref 11.0–15.0)
WBC: 10.5 10*3/uL (ref 3.8–10.8)

## 2021-02-01 LAB — COMPLETE METABOLIC PANEL WITH GFR
AG Ratio: 1.4 (calc) (ref 1.0–2.5)
ALT: 16 U/L (ref 6–29)
AST: 20 U/L (ref 10–35)
Albumin: 4.4 g/dL (ref 3.6–5.1)
Alkaline phosphatase (APISO): 75 U/L (ref 37–153)
BUN/Creatinine Ratio: 21 (calc) (ref 6–22)
BUN: 22 mg/dL (ref 7–25)
CO2: 25 mmol/L (ref 20–32)
Calcium: 9.8 mg/dL (ref 8.6–10.4)
Chloride: 104 mmol/L (ref 98–110)
Creat: 1.04 mg/dL — ABNORMAL HIGH (ref 0.60–0.93)
GFR, Est African American: 61 mL/min/{1.73_m2} (ref >=60)
GFR, Est Non African American: 53 mL/min/{1.73_m2} — ABNORMAL LOW (ref >=60)
Globulin: 3.2 g/dL (calc) (ref 1.9–3.7)
Glucose, Bld: 130 mg/dL — ABNORMAL HIGH (ref 65–99)
Potassium: 4.6 mmol/L (ref 3.5–5.3)
Sodium: 140 mmol/L (ref 135–146)
Total Bilirubin: 0.9 mg/dL (ref 0.2–1.2)
Total Protein: 7.6 g/dL (ref 6.1–8.1)

## 2021-02-01 LAB — LIPID PANEL
Cholesterol: 140 mg/dL (ref <200)
HDL: 65 mg/dL (ref >=50)
LDL Cholesterol (Calc): 54 mg/dL (calc)
Non-HDL Cholesterol (Calc): 75 mg/dL (calc) (ref <130)
Total CHOL/HDL Ratio: 2.2 (calc) (ref <5.0)
Triglycerides: 133 mg/dL (ref <150)

## 2021-02-04 ENCOUNTER — Telehealth: Payer: Self-pay | Admitting: Family Medicine

## 2021-02-04 NOTE — Chronic Care Management (AMB) (Signed)
  Chronic Care Management   Note  02/04/2021 Name: Hannah Rojas MRN: [id number] DOB: 02/26/1946  Hannah Rojas is a 75 y.o. year old female who is a primary care patient of [name], Rene Kocher, MD. I reached out to [facility] by phone today in response to a referral sent by [name]'s PCP, Hali Marry, MD.   Ms. Grade was given information about Chronic Care Management services today including:  1. CCM service includes personalized support from designated clinical staff supervised by her physician, including individualized plan of care and coordination with other care providers 2. 24/7 contact phone numbers for assistance for urgent and routine care needs. 3. Service will only be billed when office clinical staff spend 20 minutes or more in a month to coordinate care. 4. Only one practitioner may furnish and bill the service in a calendar month. 5. The patient may stop CCM services at any time (effective at the end of the month) by phone call to the office staff.   Patient agreed to services and verbal consent obtained.   Follow up plan:   Lauretta Grill [employer] Scheduler

## 2021-02-23 ENCOUNTER — Ambulatory Visit: Payer: Medicare Other

## 2021-02-23 ENCOUNTER — Other Ambulatory Visit: Payer: Medicare Other

## 2021-03-07 ENCOUNTER — Ambulatory Visit (INDEPENDENT_AMBULATORY_CARE_PROVIDER_SITE_OTHER): Payer: Medicare Other | Admitting: Pharmacist

## 2021-03-07 DIAGNOSIS — E1169 Type 2 diabetes mellitus with other specified complication: Secondary | ICD-10-CM

## 2021-03-07 DIAGNOSIS — E119 Type 2 diabetes mellitus without complications: Secondary | ICD-10-CM

## 2021-03-07 DIAGNOSIS — E785 Hyperlipidemia, unspecified: Secondary | ICD-10-CM

## 2021-03-07 NOTE — Patient Instructions (Signed)
Visit Information   PATIENT GOALS:   Goals Addressed             This Visit's Progress    Medication Management       Patient Goals/Self-Care Activities Over the next 30 days, patient will:  take medications as prescribed and collaborate with provider on medication access solutions  Follow Up Plan: Telephone follow up appointment with care management team member scheduled for:  1 month          Consent to CCM Services: Ms. Galloway was given information about Chronic Care Management services today including:  CCM service includes personalized support from designated clinical staff supervised by her physician, including individualized plan of care and coordination with other care providers 24/7 contact phone numbers for assistance for urgent and routine care needs. Service will only be billed when office clinical staff spend 20 minutes or more in a month to coordinate care. Only one practitioner may furnish and bill the service in a calendar month. The patient may stop CCM services at any time (effective at the end of the month) by phone call to the office staff. The patient will be responsible for cost sharing (co-pay) of up to 20% of the service fee (after annual deductible is met).  Patient agreed to services and verbal consent obtained.   Patient verbalizes understanding of instructions provided today and agrees to view in [place].   Telephone follow up appointment with care management team member scheduled for: 1 month  [place] CARE PLAN: Patient Care Plan: Medication Management     Problem Identified: DM, HLD      Long-Range Goal: Disease Progression Prevention   Start Date: 03/07/2021  This Visit's Progress: On track  Priority: High  Note:   Current Barriers:  Unable to independently afford treatment regimen Pt reports Xigduo $200 per 90DS, filling for now but is a barrier  Pharmacist Clinical Goal(s):  Over the next 30 days, patient  will adhere to plan to optimize therapeutic regimen for diabetes as evidenced by report of adherence to recommended medication management changes through collaboration with PharmD and provider.   Interventions: 1:1 collaboration with Hali Marry, MD regarding development and update of comprehensive plan of care as evidenced by provider attestation and co-signature Inter-disciplinary care team collaboration (see longitudinal plan of care) Comprehensive medication review performed; medication list updated in electronic medical record  Diabetes:  Uncontrolled; current treatment:Levemir 20 units ; a1c 7.7  Current glucose readings:  Date BG Reading  02/26/2021 103  02/27/2021 110  02/28/2021 116  03/01/2021 101  03/02/2021 83  03/03/2021 123  03/04/2021 96  03/05/2021 112  03/06/2021 95  03/07/2021 136  Fasting glucose average = 108.  Not currently checking postprandial glucose.  Reports hypoglycemic symptoms w/ BG 83, took orange juice  Current meal patterns: breakfast: banana; lunch: subway or salad; dinner: snacks on small things or frozen dinner ; snacks: chips, or cheezy's, handful at most; drinks: diet coke zero, water   Current exercise: walking, works full time at [employer] on benefits of low-dose ACEI for renal protection & statin for protection against cardiovascular & cerebrovascular events  Recommended continue current regimen, repeat a1c at PCP office visit in August prior to decreasing insulin. If BG <70 or frequently symptomatic, advised patient to call office for insulin adjustment. Patient verbalized understanding. Also recommend cost assistance for Xigduo, initiating [place] patient assistance application, available for pt to come sign anytime after 03/09/21.  Hyperlipidemia:  Controlled; current treatment:rosuvastatin 15m; LDL 54  Recommended continue current regimen  Patient Goals/Self-Care Activities Over the next 30 days, patient will:  take medications as  prescribed and collaborate with provider on medication access solutions  Follow Up Plan: Telephone follow up appointment with care management team member scheduled for:  1 month

## 2021-03-07 NOTE — Progress Notes (Signed)
Chronic Care Management Pharmacy Note  03/07/2021 Name:  Hannah Rojas MRN:  [id number] DOB:  07-15-1946  Summary: addressed DM, HLD. Advised to stay on current dose of insulin, if BG <70 or frequently symptomatic  of hypoglycemia to call office for insulin dose adjustment  Recommendations/Changes made from today's visit:  Recommended continue current regimen, repeat a1c at PCP office visit in August prior to self-decreasing insulin. If BG <70 or frequently symptomatic, advised patient to call office for insulin adjustment. Patient verbalized understanding. Also recommend cost assistance for Xigduo, initiating AZ&Me patient assistance application, available for pt to come sign anytime after 03/09/21.    Plan: f/u with pharmacist in 1 month  Subjective: Hannah Rojas is an 75 y.o. year old female who is a primary patient of [name], Rene Kocher, MD.  The CCM team was consulted for assistance with disease management and care coordination needs.    Engaged with patient by telephone for initial visit in response to provider referral for pharmacy case management and/or care coordination services.   Consent to Services:  The patient was given information about Chronic Care Management services, agreed to services, and gave verbal consent prior to initiation of services.  Please see initial visit note for detailed documentation.   Patient Care Team: Hali Marry, MD as PCP - General Darius Bump, [facility] as Pharmacist (Pharmacist)   Objective:  Lab Results  Component Value Date   CREATININE 1.04 (H) 01/31/2021   CREATININE 1.00 (H) 08/02/2020   CREATININE 1.05 (H) 12/22/2019    Lab Results  Component Value Date   HGBA1C 7.7 (A) 01/31/2021   Last diabetic Eye exam:  Lab Results  Component Value Date/Time   HMDIABEYEEXA No Retinopathy 12/01/2019 12:00 AM    Last diabetic Foot exam: No results found for: HMDIABFOOTEX      Component Value Date/Time   CHOL  140 01/31/2021 0000   TRIG 133 01/31/2021 0000   HDL 65 01/31/2021 0000   CHOLHDL 2.2 01/31/2021 0000   VLDL 19 03/16/2016 0925   LDLCALC 54 01/31/2021 0000   LDLDIRECT 107 (H) 05/27/2014 1010    Hepatic Function Latest Ref Rng & Units 01/31/2021 12/22/2019 02/12/2019  Total Protein 6.1 - 8.1 g/dL 7.6 7.8 7.3  Albumin 3.6 - 5.1 g/dL - - -  AST 10 - 35 U/L 20 21 20   ALT 6 - 29 U/L 16 17 17   Alk Phosphatase 33 - 130 U/L - - -  Total Bilirubin 0.2 - 1.2 mg/dL 0.9 0.9 0.5    Lab Results  Component Value Date/Time   TSH 2.18 03/29/2020 08:44 AM   TSH 1.940 03/04/2012 10:30 AM    CBC Latest Ref Rng & Units 01/31/2021 03/29/2020 02/11/2016  WBC 3.8 - 10.8 Thousand/uL 10.5 10.9(H) 11.8(H)  Hemoglobin 11.7 - 15.5 g/dL 14.6 14.2 13.6  Hematocrit 35.0 - 45.0 % 43.8 42.1 40.3  Platelets 140 - 400 Thousand/uL 259 293 353    Social History   Tobacco Use  Smoking Status Never  Smokeless Tobacco Never   BP Readings from Last 3 Encounters:  01/31/21 109/61  12/27/20 128/77  11/01/20 117/62   Pulse Readings from Last 3 Encounters:  01/31/21 72  12/27/20 92  11/01/20 66   Wt Readings from Last 3 Encounters:  01/31/21 155 lb (70.3 kg)  12/27/20 157 lb (71.2 kg)  11/01/20 158 lb (71.7 kg)    Assessment: Review of patient past medical history, allergies, medications, health status, including review of consultants  reports, laboratory and other test data, was performed as part of comprehensive evaluation and provision of chronic care management services.   SDOH:  (Social Determinants of Health) assessments and interventions performed:    CCM Care Plan  Allergies  Allergen Reactions   Ibuprofen Rash   Iodinated Diagnostic Agents Rash    IV Contrast   Sulfa Antibiotics Rash   Atorvastatin Other (See Comments)    HA   Naproxen Sodium    Pravastatin Other (See Comments)    dizziness   Simvastatin Other (See Comments)    Nightmares   Sulfonamide Derivatives     REACTION: rash    Trazodone And Nefazodone Other (See Comments)    Nightmares     Medications Reviewed Today     Reviewed by Darius Bump, [facility] (Pharmacist) on 03/07/21 at 54  Med List Status: <None>   Medication Order Taking? Sig Documenting Provider Last Dose Status Informant  Acetaminophen (TYLENOL 8 HOUR ARTHRITIS PAIN PO) [id number] Yes Take 1 tablet by mouth at bedtime as needed. [provider] Taking Active Self  blood glucose meter kit and supplies [id number] Yes Dispense based on patient and insurance preference. Use up to four times daily as directed. Dx:E11.9 Hali Marry, MD Taking Active   LEVEMIR FLEXTOUCH 100 UNIT/ML FlexPen [id number] Yes INJECT 10 TO 20 UNITS SUBCUTANEOUSLY AT BEDTIME  Patient taking differently: 14 Units.   Hali Marry, MD Taking Active            Med Note [name] Mar 07, 2021 11:18 AM) Currently at 20 units per increase, but thinking about backing down.  lisinopril (ZESTRIL) 2.5 MG tablet [id number] Yes Take 1 tablet (2.5 mg total) by mouth daily. Hali Marry, MD Taking Active   ONE TOUCH ULTRA TEST test strip [id number] Yes USE 1 STRIP TO CHECK GLUCOSE UP TO 4 TIMES DAILY AS DIRECTED. 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/06/2013   Oral lesion - Fibroma/Reactive Changes to tounge 08/07/2012   Pancreatic cyst 07/12/2011   Diabetes mellitus, type II ([place]) 05/17/2007   KIDNEY DISEASE,  CHRONIC, STAGE III 05/17/2007   ABNORMAL SERUM ENZYME LEVEL NEC 05/16/2007   CARRIER, VIRAL HEPATITIS C 05/16/2007   SCOLIOSIS 06/26/2006    Immunization History  Administered Date(s) Administered   Fluad Quad(high Dose 65+) 06/23/2019   H1N1 07/17/2008   Influenza Split 06/10/2012   Influenza Whole 08/27/2007, 06/09/2008, 06/18/2009, 07/13/2009, 06/20/2010   Influenza, High Dose Seasonal PF 06/12/2017, 06/19/2018, 07/02/2020   Influenza,inj,Quad PF,6+ Mos 05/12/2013, 05/21/2014, 06/03/2015, 06/15/2016   Moderna Sars-Covid-2 Vaccination 12/26/2019, 01/17/2020, 08/16/2020   Pneumococcal Conjugate-13 12/02/2014   Pneumococcal Polysaccharide-23 08/27/2007, 11/20/2011   Td 06/19/2005   Tdap 01/25/2017   Zoster, Live 07/20/2010    Conditions to be addressed/monitored: HLD and DMII  Care Plan : Medication Management  Updates made by Darius Bump, [place] since 03/07/2021  12:00 AM     Problem: DM, HLD      Long-Range Goal: Disease Progression Prevention   Start Date: 03/07/2021  This Visit's Progress: On track  Priority: High  Note:   Current Barriers:  Unable to independently afford treatment regimen Pt reports Xigduo $200 per 90DS, filling for now but is a barrier  Pharmacist Clinical Goal(s):  Over the next 30 days, patient will adhere to plan to optimize therapeutic regimen for diabetes as evidenced by report of adherence to recommended medication management changes through collaboration with PharmD and provider.   Interventions: 1:1 collaboration with Hali Marry, MD regarding development and update of comprehensive plan of care as evidenced by provider attestation and co-signature Inter-disciplinary care team collaboration (see longitudinal plan of care) Comprehensive medication review performed; medication list updated in electronic medical record  Diabetes:  Uncontrolled; current treatment:Levemir 20 units ; a1c 7.7  Current glucose readings:  Date BG  Reading  02/26/2021 103  02/27/2021 110  02/28/2021 116  03/01/2021 101  03/02/2021 83  03/03/2021 123  03/04/2021 96  03/05/2021 112  03/06/2021 95  03/07/2021 136  Fasting glucose average = 108.  Not currently checking postprandial glucose.  Reports hypoglycemic symptoms w/ BG 83, took orange juice  Current meal patterns: breakfast: banana; lunch: subway or salad; dinner: snacks on small things or frozen dinner ; snacks: chips, or cheezy's, handful at most; drinks: diet coke zero, water   Current exercise: walking, works full time at [employer] on benefits of low-dose ACEI for renal protection & statin for protection against cardiovascular & cerebrovascular events  Recommended continue current regimen, repeat a1c at PCP office visit in August prior to decreasing insulin. If BG <70 or frequently symptomatic, advised patient to call office for insulin adjustment. Patient verbalized understanding. Also recommend cost assistance for Xigduo, initiating AZ&Me patient assistance application, available for pt to come sign anytime after 03/09/21.   Hyperlipidemia:  Controlled; current treatment:rosuvastatin 63m; LDL 54  Recommended continue current regimen  Patient Goals/Self-Care Activities Over the next 30 days, patient will:  take medications as prescribed and collaborate with provider on medication access solutions  Follow Up Plan: Telephone follow up appointment with care management team member scheduled for:  1 month      Medication Assistance:  Initiating PAP for Xigduo with AZ&Me program, pt may come by office as early as 03/09/21 to sign paperwork.  Patient's [facility] pharmacy [facility]  [place] [place] [place]Phone: [phone]: [address]Phone: [phone]: [phone] [facility]  Mail Service  [address] [address] [address] [place] [place] [place]Phone: [phone]: [phone] Uses pill box? Yes Pt endorses 100% compliance  Follow Up:  Patient agrees to Care Plan and Follow-up.  Plan: Telephone follow up appointment with care management team member scheduled for:  1 month  KDarius Bump

## 2021-03-16 ENCOUNTER — Other Ambulatory Visit: Payer: Self-pay | Admitting: *Deleted

## 2021-03-16 DIAGNOSIS — E119 Type 2 diabetes mellitus without complications: Secondary | ICD-10-CM

## 2021-03-16 MED ORDER — GLUCOSE BLOOD VI STRP: ORAL_STRIP | 12 refills | Status: DC

## 2021-03-23 ENCOUNTER — Ambulatory Visit (INDEPENDENT_AMBULATORY_CARE_PROVIDER_SITE_OTHER): Payer: Medicare Other

## 2021-03-23 ENCOUNTER — Other Ambulatory Visit: Payer: Self-pay

## 2021-03-23 DIAGNOSIS — Z780 Asymptomatic menopausal state: Secondary | ICD-10-CM | POA: Diagnosis not present

## 2021-03-23 DIAGNOSIS — M8589 Other specified disorders of bone density and structure, multiple sites: Secondary | ICD-10-CM | POA: Diagnosis not present

## 2021-03-23 DIAGNOSIS — Z1231 Encounter for screening mammogram for malignant neoplasm of breast: Secondary | ICD-10-CM | POA: Diagnosis not present

## 2021-03-23 DIAGNOSIS — Z0000 Encounter for general adult medical examination without abnormal findings: Secondary | ICD-10-CM

## 2021-03-29 ENCOUNTER — Other Ambulatory Visit: Payer: Self-pay | Admitting: Family Medicine

## 2021-03-29 ENCOUNTER — Telehealth: Payer: Self-pay | Admitting: *Deleted

## 2021-03-29 DIAGNOSIS — E119 Type 2 diabetes mellitus without complications: Secondary | ICD-10-CM

## 2021-03-29 NOTE — Telephone Encounter (Signed)
Pt called about refill on her Xigduo. She said that she has been out for 4 days. She called her mail order for a refill and they denied the request because she was unable to pay for this today. She asked if she can get a refill for a short supply for now.   I informed her that we have received her approval for her Zigduo from [place] however I am unsure how long it will be before she can get a refill of her medication.   Will fwd to pcp for advice.   30 day supply sent to her local pharmacy.

## 2021-04-01 NOTE — Progress Notes (Deleted)
Chronic Care Management Pharmacy Note  04/01/2021 Name:  Hannah Rojas MRN:  [id number] DOB:  04-10-1946  Summary:  Recommendations/Changes made from today's visit:  Plan:  Subjective: Hannah Rojas is an 75 y.o. year old female who is a primary patient of [name], Rene Kocher, MD.  The CCM team was consulted for assistance with disease management and care coordination needs.    {CCMTELEPHONEFACETOFACE:21091510} for {CCMINITIALFOLLOWUPCHOICE:21091511} in response to provider referral for pharmacy case management and/or care coordination services.   Consent to Services:  {CCMCONSENTOPTIONS:25074}  Patient Care Team: Hali Marry, MD as PCP - General Darius Bump, [facility] as Pharmacist (Pharmacist)  Recent office visits: ***  Recent consult visits: [facility] visits: {Hospital DC Yes/No:21091515}  Objective:  Lab Results  Component Value Date   CREATININE 1.04 (H) 01/31/2021   CREATININE 1.00 (H) 08/02/2020   CREATININE 1.05 (H) 12/22/2019    Lab Results  Component Value Date   HGBA1C 7.7 (A) 01/31/2021   Last diabetic Eye exam:  Lab Results  Component Value Date/Time   HMDIABEYEEXA No Retinopathy 12/01/2019 12:00 AM    Last diabetic Foot exam: No results found for: HMDIABFOOTEX      Component Value Date/Time   CHOL 140 01/31/2021 0000   TRIG 133 01/31/2021 0000   HDL 65 01/31/2021 0000   CHOLHDL 2.2 01/31/2021 0000   VLDL 19 03/16/2016 0925   LDLCALC 54 01/31/2021 0000   LDLDIRECT 107 (H) 05/27/2014 1010    Hepatic Function Latest Ref Rng & Units 01/31/2021 12/22/2019 02/12/2019  Total Protein 6.1 - 8.1 g/dL 7.6 7.8 7.3  Albumin 3.6 - 5.1 g/dL - - -  AST 10 - 35 U/L _0 ALT 6 - 29 U/L _1 Alk Phosphatase 33 - 130 U/L - - -  Total Bilirubin 0.2 - 1.2 mg/dL 0.9 0.9 0.5    Lab Results  Component Value Date/Time   TSH 2.18 03/29/2020 08:44 AM   TSH 1.940 03/04/2012 10:30 AM    CBC Latest Ref Rng & Units  01/31/2021 03/29/2020 02/11/2016  WBC 3.8 - 10.8 Thousand/uL 10.5 10.9(H) 11.8(H)  Hemoglobin 11.7 - 15.5 g/dL 14.6 14.2 13.6  Hematocrit 35.0 - 45.0 % 43.8 42.1 40.3  Platelets 140 - 400 Thousand/uL 259 293 353    No results found for: VD25OH  Clinical ASCVD: {YES/NO:21197} The 10-year ASCVD risk score Mikey Bussing DC Jr., et al., 2013) is: 18.9%   Values used to calculate the score:     Age: 86 years     Sex: Female     Is Non-Hispanic African American: No     Diabetic: Yes     Tobacco smoker: No     Systolic Blood Pressure: 592 mmHg     Is BP treated: No     HDL Cholesterol: 65 mg/dL     Total Cholesterol: 140 mg/dL    Other: (CHADS2VASc if Afib, PHQ9 if depression, MMRC or CAT for COPD, ACT, DEXA)  Social History   Tobacco Use  Smoking Status Never  Smokeless Tobacco Never   BP Readings from Last 3 Encounters:  01/31/21 109/61  12/27/20 128/77  11/01/20 117/62   Pulse Readings from Last 3 Encounters:  01/31/21 72  12/27/20 92  11/01/20 66   Wt Readings from Last 3 Encounters:  01/31/21 155 lb (70.3 kg)  12/27/20 157 lb (71.2 kg)  11/01/20 158 lb (71.7 kg)    Assessment: Review of patient past medical history, allergies,  medications, health status, including review of consultants reports, laboratory and other test data, was performed as part of comprehensive evaluation and provision of chronic care management services.   SDOH:  (Social Determinants of Health) assessments and interventions performed:    CCM Care Plan  Allergies  Allergen Reactions   Ibuprofen Rash   Iodinated Diagnostic Agents Rash    IV Contrast   Sulfa Antibiotics Rash   Atorvastatin Other (See Comments)    HA   Naproxen Sodium    Pravastatin Other (See Comments)    dizziness   Simvastatin Other (See Comments)    Nightmares   Sulfonamide Derivatives     REACTION: rash   Trazodone And Nefazodone Other (See Comments)    Nightmares     Medications Reviewed Today     Reviewed by Darius Bump, [facility] (Pharmacist) on 03/07/21 at 64  Med List Status: <None>   Medication Order Taking? Sig Documenting Provider Last Dose Status Informant  Acetaminophen (TYLENOL 8 HOUR ARTHRITIS PAIN PO) [id number] Yes Take 1 tablet by mouth at bedtime as needed. [provider] Taking Active Self  blood glucose meter kit and supplies [id number] Yes Dispense based on patient and insurance preference. Use up to four times daily as directed. Dx:E11.9 Hali Marry, MD Taking Active   LEVEMIR FLEXTOUCH 100 UNIT/ML FlexPen [id number] Yes INJECT 10 TO 20 UNITS SUBCUTANEOUSLY AT BEDTIME  Patient taking differently: 14 Units.   Hali Marry, MD Taking Active            Med Note [name] Mar 07, 2021 11:18 AM) Currently at 20 units per increase, but thinking about backing down.  lisinopril (ZESTRIL) 2.5 MG tablet [id number] Yes Take 1 tablet (2.5 mg total) by mouth daily. Hali Marry, MD Taking Active   ONE TOUCH ULTRA TEST test strip [id number] Yes USE 1 STRIP TO CHECK GLUCOSE UP TO 4 TIMES DAILY AS DIRECTED. Hali Marry, MD Taking Active   rosuvastatin (CRESTOR) 20 MG tablet [id number] Yes TAKE 1 TABLET BY MOUTH  DAILY AT BEDTIME Hali Marry, MD Taking Active   XIGDUO XR 06-999 MG TB24 [id number] Yes TAKE 1 TABLET BY MOUTH  DAILY WITH BREAKFAST Hali Marry, MD Taking Active             Patient Active Problem List   Diagnosis Date Noted   Seasonal allergies 03/29/2020   History of iron deficiency 03/29/2020   Hyperlipidemia associated with type 2 diabetes mellitus ([place]) 03/13/2016   Obesity (BMI 30-39.9) 11/19/2013   History of splenectomy 01/06/2013   Oral lesion - Fibroma/Reactive Changes to tounge 08/07/2012   Pancreatic cyst 07/12/2011   Diabetes mellitus, type II ([place]) 05/17/2007   KIDNEY DISEASE, CHRONIC, STAGE III 05/17/2007   ABNORMAL SERUM ENZYME LEVEL NEC 05/16/2007   CARRIER, VIRAL HEPATITIS C 05/16/2007    SCOLIOSIS 06/26/2006    Immunization History  Administered Date(s) Administered   Fluad Quad(high Dose 65+) 06/23/2019   H1N1 07/17/2008   Influenza Split 06/10/2012   Influenza Whole 08/27/2007, 06/09/2008, 06/18/2009, 07/13/2009, 06/20/2010   Influenza, High Dose Seasonal PF 06/12/2017, 06/19/2018, 07/02/2020   Influenza,inj,Quad PF,6+ Mos 05/12/2013, 05/21/2014, 06/03/2015, 06/15/2016   Moderna Sars-Covid-2 Vaccination 12/26/2019, 01/17/2020, 08/16/2020   Pneumococcal Conjugate-13 12/02/2014   Pneumococcal Polysaccharide-23 08/27/2007, 11/20/2011   Td 06/19/2005   Tdap 01/25/2017   Zoster, Live 07/20/2010    Conditions to be addressed/monitored: {CCM ASSESSMENT DISEASE OPTIONS:25047}  There are no care plans that you  recently modified to display for this patient.   Medication Assistance: {MEDASSISTANCEINFO:25044}  Patient's preferred pharmacy is:  [place], [place] [address] [place] [address] Phone: [phone] Fax: [place] [address] [place] [address] Phone: [phone] Fax: [phone]  [facility]  ([place], [place] [address] Phone: [phone] Fax: [phone]  Uses pill box? {Yes or If no, why not?:20788} Pt endorses ***% compliance  Follow Up:  {FOLLOWUP:24991}  Plan: {CM FOLLOW UP PLAN:25073}  SIG***

## 2021-04-04 ENCOUNTER — Telehealth: Payer: Self-pay | Admitting: Family Medicine

## 2021-04-04 ENCOUNTER — Telehealth: Payer: Medicare Other

## 2021-04-04 ENCOUNTER — Telehealth: Payer: Self-pay | Admitting: Pharmacist

## 2021-04-04 NOTE — Telephone Encounter (Signed)
Unsuccessful outreach to Ms. Seng at 2:55pm on 04/04/21.   I missed our telephone appointment originally scheduled for 9am on 04/04/21 due to system wide server outage.   Left patient a VM to call office back and get rescheduled with pharmacist.   If pt calls to rescheduled: this visit type would be "CCM telephone," duration 30 minutes, with notation of "CCM f/u" in the appt notes.   Will also make future attempts to reconnect.  Darius Bump

## 2021-04-04 NOTE — Telephone Encounter (Signed)
Patient was in and she was wanting a call from [place] whenever she's not busy. AM

## 2021-04-06 ENCOUNTER — Ambulatory Visit (INDEPENDENT_AMBULATORY_CARE_PROVIDER_SITE_OTHER): Payer: Medicare Other | Admitting: Pharmacist

## 2021-04-06 ENCOUNTER — Other Ambulatory Visit: Payer: Self-pay

## 2021-04-06 DIAGNOSIS — E1169 Type 2 diabetes mellitus with other specified complication: Secondary | ICD-10-CM

## 2021-04-06 DIAGNOSIS — E785 Hyperlipidemia, unspecified: Secondary | ICD-10-CM

## 2021-04-06 DIAGNOSIS — E119 Type 2 diabetes mellitus without complications: Secondary | ICD-10-CM | POA: Diagnosis not present

## 2021-04-06 NOTE — Patient Instructions (Signed)
Visit Information  PATIENT GOALS:  Goals Addressed             This Visit's Progress    Medication Management       Patient Goals/Self-Care Activities Over the next 7 days, patient will:  take medications as prescribed and collaborate with provider on medication access solutions  Follow Up Plan: Telephone follow up appointment with care management team member scheduled for:  1 week        Patient verbalizes understanding of instructions provided today and agrees to view in [place].   Telephone follow up appointment with care management team member scheduled for: 1 week  Hannah Rojas

## 2021-04-06 NOTE — Progress Notes (Signed)
Chronic Care Management Pharmacy Note  04/06/2021 Name:  Hannah Rojas MRN:  [id number] DOB:  [birth year]  Summary: has been without xigduo for 3-4 weeks, $220 per fill through mail order and did not fill d/t cost.   Recommendations/Changes made from today's visit: Patient approved for xigduo cost assistance! Coordinated w/[employer] rep, who states approval was 03/26/21 & runs through 09/17/21. Medication supply is to be delivered 04/06/21 to patient's residence. Advised patient to restart xigduo medication, if BG runs 70-80 and symptomatic, decrease levemir from 20 units to 15 units nightly. Repeat a1c at PCP office visit in August to make more decisions about insulin dosing. Patient verbalized understanding.   Plan: f/u with pharmacist in 1 week  Subjective: Hannah Rojas is an 75 y.o. year old female who is a primary patient of [name], Rene Kocher, MD.  The CCM team was consulted for assistance with disease management and care coordination needs.    Engaged with patient by telephone for follow up visit in response to provider referral for pharmacy case management and/or care coordination services.   Consent to Services:  The patient was given information about Chronic Care Management services, agreed to services, and gave verbal consent prior to initiation of services.  Please see initial visit note for detailed documentation.   Patient Care Team: Hali Marry, MD as PCP - General Darius Bump, [facility] as Pharmacist (Pharmacist)   Objective:  Lab Results  Component Value Date   CREATININE 1.04 (H) 01/31/2021   CREATININE 1.00 (H) 08/02/2020   CREATININE 1.05 (H) 12/22/2019    Lab Results  Component Value Date   HGBA1C 7.7 (A) 01/31/2021   Last diabetic Eye exam:  Lab Results  Component Value Date/Time   HMDIABEYEEXA No Retinopathy 12/01/2019 12:00 AM    Last diabetic Foot exam: No results found for: HMDIABFOOTEX      Component Value Date/Time   CHOL 140  01/31/2021 0000   TRIG 133 01/31/2021 0000   HDL 65 01/31/2021 0000   CHOLHDL 2.2 01/31/2021 0000   VLDL 19 03/16/2016 0925   LDLCALC 54 01/31/2021 0000   LDLDIRECT 107 (H) 05/27/2014 1010    Hepatic Function Latest Ref Rng & Units 01/31/2021 12/22/2019 02/12/2019  Total Protein 6.1 - 8.1 g/dL 7.6 7.8 7.3  Albumin 3.6 - 5.1 g/dL - - -  AST 10 - 35 U/L 20 21 20   ALT 6 - 29 U/L 16 17 17   Alk Phosphatase 33 - 130 U/L - - -  Total Bilirubin 0.2 - 1.2 mg/dL 0.9 0.9 0.5    Lab Results  Component Value Date/Time   TSH 2.18 03/29/2020 08:44 AM   TSH 1.940 03/04/2012 10:30 AM    CBC Latest Ref Rng & Units 01/31/2021 03/29/2020 02/11/2016  WBC 3.8 - 10.8 Thousand/uL 10.5 10.9(H) 11.8(H)  Hemoglobin 11.7 - 15.5 g/dL 14.6 14.2 13.6  Hematocrit 35.0 - 45.0 % 43.8 42.1 40.3  Platelets 140 - 400 Thousand/uL 259 293 353    Social History   Tobacco Use  Smoking Status Never  Smokeless Tobacco Never   BP Readings from Last 3 Encounters:  01/31/21 109/61  12/27/20 128/77  11/01/20 117/62   Pulse Readings from Last 3 Encounters:  01/31/21 72  12/27/20 92  11/01/20 66   Wt Readings from Last 3 Encounters:  01/31/21 155 lb (70.3 kg)  12/27/20 157 lb (71.2 kg)  11/01/20 158 lb (71.7 kg)    Assessment: Review of patient past medical history, allergies, medications, health  status, including review of consultants reports, laboratory and other test data, was performed as part of comprehensive evaluation and provision of chronic care management services.   SDOH:  (Social Determinants of Health) assessments and interventions performed:    CCM Care Plan  Allergies  Allergen Reactions   Ibuprofen Rash   Iodinated Diagnostic Agents Rash    IV Contrast   Sulfa Antibiotics Rash   Atorvastatin Other (See Comments)    HA   Naproxen Sodium    Pravastatin Other (See Comments)    dizziness   Simvastatin Other (See Comments)    Nightmares   Sulfonamide Derivatives     REACTION: rash    Trazodone And Nefazodone Other (See Comments)    Nightmares     Medications Reviewed Today     Reviewed by Darius Bump, [facility] (Pharmacist) on 03/07/21 at 28  Med List Status: <None>   Medication Order Taking? Sig Documenting Provider Last Dose Status Informant  Acetaminophen (TYLENOL 8 HOUR ARTHRITIS PAIN PO) [id number] Yes Take 1 tablet by mouth at bedtime as needed. [provider] Taking Active Self  blood glucose meter kit and supplies [id number] Yes Dispense based on patient and insurance preference. Use up to four times daily as directed. Dx:E11.9 Hali Marry, MD Taking Active   LEVEMIR FLEXTOUCH 100 UNIT/ML FlexPen [id number] Yes INJECT 10 TO 20 UNITS SUBCUTANEOUSLY AT BEDTIME  Patient taking differently: 14 Units.   Hali Marry, MD Taking Active            Med Note [name] Mar 07, 2021 11:18 AM) Currently at 20 units per increase, but thinking about backing down.  lisinopril (ZESTRIL) 2.5 MG tablet [id number] Yes Take 1 tablet (2.5 mg total) by mouth daily. Hali Marry, MD Taking Active   ONE TOUCH ULTRA TEST test strip [id number] Yes USE 1 STRIP TO CHECK GLUCOSE UP TO 4 TIMES DAILY AS DIRECTED. Hali Marry, MD Taking Active   rosuvastatin (CRESTOR) 20 MG tablet [id number] Yes TAKE 1 TABLET BY MOUTH  DAILY AT BEDTIME Hali Marry, MD Taking Active   XIGDUO XR 06-999 MG TB24 [id number] Yes TAKE 1 TABLET BY MOUTH  DAILY WITH BREAKFAST Hali Marry, MD Taking Active             Patient Active Problem List   Diagnosis Date Noted   Seasonal allergies 03/29/2020   History of iron deficiency 03/29/2020   Hyperlipidemia associated with type 2 diabetes mellitus ([place]) 03/13/2016   Obesity (BMI 30-39.9) 11/19/2013   History of splenectomy 01/06/2013   Oral lesion - Fibroma/Reactive Changes to tounge 08/07/2012   Pancreatic cyst 07/12/2011   Diabetes mellitus, type II ([place]) 05/17/2007   KIDNEY DISEASE,  CHRONIC, STAGE III 05/17/2007   ABNORMAL SERUM ENZYME LEVEL NEC 05/16/2007   CARRIER, VIRAL HEPATITIS C 05/16/2007   SCOLIOSIS 06/26/2006    Immunization History  Administered Date(s) Administered   Fluad Quad(high Dose 65+) 06/23/2019   H1N1 07/17/2008   Influenza Split 06/10/2012   Influenza Whole 08/27/2007, 06/09/2008, 06/18/2009, 07/13/2009, 06/20/2010   Influenza, High Dose Seasonal PF 06/12/2017, 06/19/2018, 07/02/2020   Influenza,inj,Quad PF,6+ Mos 05/12/2013, 05/21/2014, 06/03/2015, 06/15/2016   Moderna Sars-Covid-2 Vaccination 12/26/2019, 01/17/2020, 08/16/2020   Pneumococcal Conjugate-13 12/02/2014   Pneumococcal Polysaccharide-23 08/27/2007, 11/20/2011   Td 06/19/2005   Tdap 01/25/2017   Zoster, Live 07/20/2010    Conditions to be addressed/monitored: HLD and DMII  Care Plan : Medication Management  Updates made by Jens Som,  Felecia Shelling, RPH since 04/06/2021 12:00 AM     Problem: DM, HLD      Long-Range Goal: Disease Progression Prevention   Start Date: 03/07/2021  Recent Progress: On track  Priority: High  Note:   Current Barriers:  Unable to independently afford treatment regimen Pt reports Xigduo $200 per 90DS, has gone without medicine for past 3-4 weeks  Pharmacist Clinical Goal(s):  Over the next 30 days, patient will adhere to plan to optimize therapeutic regimen for diabetes as evidenced by report of adherence to recommended medication management changes through collaboration with PharmD and provider.   Interventions: 1:1 collaboration with Hali Marry, MD regarding development and update of comprehensive plan of care as evidenced by provider attestation and co-signature Inter-disciplinary care team collaboration (see longitudinal plan of care) Comprehensive medication review performed; medication list updated in electronic medical record  Diabetes:  Uncontrolled; current treatment:Levemir 20 units nightly ; a1c 7.7  Current glucose readings:   Date BG Reading  02/26/2021 103  02/27/2021 110  02/28/2021 116  03/01/2021 101  03/02/2021 83  03/03/2021 123  03/04/2021 96  03/05/2021 112  03/06/2021 95  03/07/2021 136  Fasting glucose average = 108.  Not currently checking postprandial glucose.  Previously reports hypoglycemic symptoms w/ BG 83, took orange juice  Previously discussed current meal patterns: breakfast: banana; lunch: subway or salad; dinner: snacks on small things or frozen dinner ; snacks: chips, or cheezy's, handful at most; drinks: diet coke zero, water   Current exercise: walking, works full time at [employer]  Previously educated on benefits of low-dose ACEI for renal protection & statin for protection against cardiovascular & cerebrovascular events  Patient approved for xigduo cost assistance! Coordinated w/[employer] rep, who states approval was 03/26/21 & runs through 09/17/21. Medication supply is to be delivered 04/06/21 to patient's residence. Advised patient to restart xigduo medication, if BG runs 70-80 and symptomatic, decrease levemir from 20 units to 15 units nightly. Repeat a1c at PCP office visit in August to make more decisions about insulin dosing. Patient verbalized understanding.   Hyperlipidemia:  Controlled; current treatment:rosuvastatin 40m; LDL 54  Recommended continue current regimen  Patient Goals/Self-Care Activities Over the next 7 days, patient will:  take medications as prescribed and collaborate with provider on medication access solutions  Follow Up Plan: Telephone follow up appointment with care management team member scheduled for:  1 week     Medication Assistance:  Xigduoobtained through [employer] medication assistance program.  Enrollment ends 09/17/21.  [place] [place]: [phone]: [address] [place]Phone: [phone]: [phone] [facility]  [place] [address] [address]Phone: [phone]: [phone]  Follow Up:  Patient agrees to Care Plan and Follow-up.  Plan: Telephone follow up appointment with care management team member scheduled for:  1 week  KDarius Bump

## 2021-04-11 ENCOUNTER — Telehealth: Payer: Medicare Other

## 2021-04-14 ENCOUNTER — Telehealth: Payer: Medicare Other

## 2021-04-19 ENCOUNTER — Other Ambulatory Visit: Payer: Self-pay | Admitting: Family Medicine

## 2021-05-02 ENCOUNTER — Ambulatory Visit (INDEPENDENT_AMBULATORY_CARE_PROVIDER_SITE_OTHER): Payer: Medicare Other | Admitting: Family Medicine

## 2021-05-02 ENCOUNTER — Encounter: Payer: Self-pay | Admitting: Family Medicine

## 2021-05-02 ENCOUNTER — Other Ambulatory Visit: Payer: Self-pay

## 2021-05-02 VITALS — BP 123/47 | HR 77 | Ht 61.0 in | Wt 153.0 lb

## 2021-05-02 DIAGNOSIS — R5383 Other fatigue: Secondary | ICD-10-CM | POA: Diagnosis not present

## 2021-05-02 DIAGNOSIS — E119 Type 2 diabetes mellitus without complications: Secondary | ICD-10-CM | POA: Diagnosis not present

## 2021-05-02 DIAGNOSIS — N1831 Chronic kidney disease, stage 3a: Secondary | ICD-10-CM | POA: Diagnosis not present

## 2021-05-02 LAB — POCT GLYCOSYLATED HEMOGLOBIN (HGB A1C): Hemoglobin A1C: 7.3 % — AB (ref 4.0–5.6)

## 2021-05-02 NOTE — Assessment & Plan Note (Signed)
Follow renal function every 6 months. 

## 2021-05-02 NOTE — Progress Notes (Signed)
Established Patient Office Visit  Subjective:  Patient ID: Hannah Rojas, female    DOB: 1946-09-16  Age: 75 y.o. MRN: [id number]  CC:  Chief Complaint  Patient presents with   Diabetes     HPI Hannah Rojas presents for   Diabetes - no hypoglycemic events. No wounds or sores that are not healing well. No increased thirst or urination. Checking glucose at home.  She did run out of her Merleen Nicely for about 2 weeks while we were waiting to get prior approval.  She says after about a week of being off of it she actually started to feel a lot better she had a lot more energy she was able to do things when she got home from work.  Prior to that she was really struggling with just motivation and lack of energy.  Since starting back on the Xigduo she has noticed she is felt more tired again.  She is currently on 18 units of Levemir.   Past Medical History:  Diagnosis Date   Cataracts, bilateral    bilateral   H. pylori infection    history   Pancreatic cyst    hx mucinous cystoadenoma    Past Surgical History:  Procedure Laterality Date   CHOLECYSTECTOMY     ESOPHAGOGASTRODUODENOSCOPY     PANCREATIC CYST EXCISION     spleenectomy      No family history on file.  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 She takes care of grandchildren through out the week. She likes to go for walks in her free time.   Social Determinants of Health   Financial Resource Strain: Low Risk     Difficulty of Paying Living Expenses: Not hard at all  Food Insecurity: No Food Insecurity   Worried About Charity fundraiser in the Last Year: Never true   [place] in the Last Year: Never true  Transportation Needs: No Transportation Needs   Lack of Transportation (Medical): No   Lack of Transportation (Non-Medical): No  Physical Activity: Insufficiently Active   Days of Exercise per Week: 1 day   Minutes of Exercise per Session: 20 min  Stress: No Stress Concern Present   Feeling of Stress : Not at all  Social Connections: Moderately Isolated   Frequency of Communication with Friends and Family: More than three times a week   Frequency of Social Gatherings with Friends and Family: Once a week   Attends Religious Services: More than 4 times per year   Active Member of [employer] or Organizations: No   Attends Archivist Meetings: Never   Marital Status: Widowed  Human resources officer Violence: Not At Risk   Fear of Current or Ex-Partner: No   Emotionally Abused: No   Physically Abused: No   Sexually Abused: No    Outpatient Medications Prior to Visit  Medication Sig  Dispense Refill   Acetaminophen (TYLENOL 8 HOUR ARTHRITIS PAIN PO) Take 1 tablet by mouth at bedtime as needed.     blood glucose meter kit and supplies Dispense based on patient and insurance preference. Use up to four times daily as directed. Dx:E11.9 1 each 0   glucose blood test strip TO CHECK GLUCOSE UP TO 4 TIMES DAILY AS DIRECTED. 300 each 12   LEVEMIR FLEXTOUCH 100 UNIT/ML FlexPen INJECT 10 TO 20 UNITS SUBCUTANEOUSLY AT BEDTIME (Patient taking differently: 14 Units.) 15 mL 3   lisinopril (ZESTRIL) 2.5 MG tablet Take 1 tablet by mouth once daily 90 tablet 0   melatonin 5 MG TABS Take 5 mg by mouth at bedtime.     rosuvastatin (CRESTOR) 20 MG tablet TAKE 1 TABLET BY MOUTH AT BEDTIME 90 tablet 0   XIGDUO XR 06-999 MG TB24 Take 1 tablet by mouth once daily with breakfast 30 tablet 0   No  facility-administered medications prior to visit.    Allergies  Allergen Reactions   Ibuprofen Rash   Iodinated Diagnostic Agents Rash    IV Contrast   Sulfa Antibiotics Rash   Atorvastatin Other (See Comments)    HA   Naproxen Sodium    Pravastatin Other (See Comments)    dizziness   Simvastatin Other (See Comments)    Nightmares   Sulfonamide Derivatives     REACTION: rash   Trazodone And Nefazodone Other (See Comments)    Nightmares     ROS Review of Systems    Objective:    Physical Exam Constitutional:      Appearance: Normal appearance. She is well-developed.  HENT:     Head: Normocephalic and atraumatic.  Cardiovascular:     Rate and Rhythm: Normal rate and regular rhythm.     Heart sounds: Normal heart sounds.  Pulmonary:     Effort: Pulmonary effort is normal.     Breath sounds: Normal breath sounds.  Skin:    General: Skin is warm and dry.  Neurological:     Mental Status: She is alert and oriented to person, place, and time.  Psychiatric:        Behavior: Behavior normal.    BP (!) 123/47   Pulse 77   Ht 5' 1"  (1.549 m)   Wt 153 lb (69.4 kg)   SpO2 100%   BMI 28.91 kg/m  Wt Readings from Last 3 Encounters:  05/02/21 153 lb (69.4 kg)  01/31/21 155 lb (70.3 kg)  12/27/20 157 lb (71.2 kg)     There are no preventive care reminders to display for this patient.   There are no preventive care reminders to display for this patient.  Lab Results  Component Value Date   TSH 2.18 03/29/2020   Lab Results  Component Value Date   WBC 10.5 01/31/2021   HGB 14.6 01/31/2021   HCT 43.8 01/31/2021   MCV 91.1 01/31/2021   PLT 259 01/31/2021   Lab Results  Component Value Date   NA 140 01/31/2021   K 4.6 01/31/2021   CO2 25 01/31/2021   GLUCOSE 130 (H) 01/31/2021   BUN 22 01/31/2021   CREATININE 1.04 (H) 01/31/2021   BILITOT 0.9 01/31/2021   ALKPHOS 63 02/07/2017   AST 20 01/31/2021   ALT 16 01/31/2021   PROT 7.6 01/31/2021   ALBUMIN  4.3 02/07/2017   CALCIUM 9.8 01/31/2021   Lab Results  Component Value Date   CHOL 140 01/31/2021   Lab Results  Component Value Date   HDL 65 01/31/2021   Lab Results  Component Value Date   LDLCALC 54 01/31/2021   Lab Results  Component Value Date   TRIG 133 01/31/2021   Lab Results  Component Value Date   CHOLHDL 2.2 01/31/2021   Lab Results  Component Value Date   HGBA1C 7.3 (A) 05/02/2021      Assessment & Plan:   Problem List Items Addressed This Visit       Endocrine   Diabetes mellitus, type II ([place]) - Primary    Decrease your Levemir shot to 10 units daily for 2 weeks.  If your blood sugars are still under 110 each morning then we will probably stop the insulin completely and see how you feel.  Please let me know if you are feeling any better or not.  If you are not feeling better then we may try cutting your pill in half, and see if you feel better since she did have more energy when you ran out of it for about 2 weeks.      Relevant Orders   POCT glycosylated hemoglobin (Hb A1C) (Completed)     Genitourinary   KIDNEY DISEASE, CHRONIC, STAGE III    Follow renal function every 6 months.      Other Visit Diagnoses     Fatigue, unspecified type           No orders of the defined types were placed in this encounter.   Follow-up: Return in about 3 months (around 08/02/2021) for Diabetes follow-up.    Beatrice Lecher, MD

## 2021-05-02 NOTE — Patient Instructions (Signed)
Decrease your Levemir shot to 10 units daily for 2 weeks.  If your blood sugars are still under 110 each morning then we will probably stop the insulin completely and see how you feel.  Please let me know if you are feeling any better or not.  If you are not feeling better then we may try cutting your pill in half, and see if you feel better since she did have more energy when you ran out of it for about 2 weeks.

## 2021-05-02 NOTE — Assessment & Plan Note (Signed)
Decrease your Levemir shot to 10 units daily for 2 weeks.  If your blood sugars are still under 110 each morning then we will probably stop the insulin completely and see how you feel.  Please let me know if you are feeling any better or not.  If you are not feeling better then we may try cutting your pill in half, and see if you feel better since she did have more energy when you ran out of it for about 2 weeks.

## 2021-05-09 ENCOUNTER — Ambulatory Visit (INDEPENDENT_AMBULATORY_CARE_PROVIDER_SITE_OTHER): Payer: Medicare Other | Admitting: Pharmacist

## 2021-05-09 ENCOUNTER — Other Ambulatory Visit: Payer: Self-pay

## 2021-05-09 DIAGNOSIS — E785 Hyperlipidemia, unspecified: Secondary | ICD-10-CM

## 2021-05-09 DIAGNOSIS — E119 Type 2 diabetes mellitus without complications: Secondary | ICD-10-CM

## 2021-05-09 DIAGNOSIS — E1169 Type 2 diabetes mellitus with other specified complication: Secondary | ICD-10-CM | POA: Diagnosis not present

## 2021-05-09 NOTE — Progress Notes (Signed)
Chronic Care Management Pharmacy Note  05/09/2021 Name:  Hannah Rojas MRN:  [id number] DOB:  09/13/1946  Summary: addressed HLD, DM. Pt stopped xigduo d/t excessive fatigue.  Recommendations/Changes made from today's visit: Recommend trial of metformin 534m BID and STOPPING xigduo to see if it is metformin component vs dapagliflozin component within xigduo causing fatigue. After trial of metformin alone, if tolerates, consider adding rybelsus for additional patient personal goal of weight loss. Goal regimen (if tolerates): metformin + rybelsus, and STOPPING insulin.  Plan: f/u with pharmacist in 2 weeks  Subjective: Hannah BECVARis an 75y.o. year old female who is a primary patient of [name], CRene Kocher MD.  The CCM team was consulted for assistance with disease management and care coordination needs.   Engaged with patient by telephone Engaged with patient by telephone for follow up visit in response to provider referral for pharmacy case management and/or care coordination services.   Consent to Services:  The patient was given information about Chronic Care Management services, agreed to services, and gave verbal consent prior to initiation of services.  Please see initial visit note for detailed documentation.   Patient Care Team: MHali Marry MD as PCP - General KDarius Bump [facility] Pharmacist (Pharmacist)   Objective:  Lab Results  Component Value Date   CREATININE 1.04 (H) 01/31/2021   CREATININE 1.00 (H) 08/02/2020   CREATININE 1.05 (H) 12/22/2019    Lab Results  Component Value Date   HGBA1C 7.3 (A) 05/02/2021   Last diabetic Eye exam:  Lab Results  Component Value Date/Time   HMDIABEYEEXA No Retinopathy 12/01/2019 12:00 AM    Last diabetic Foot exam: No results found for: HMDIABFOOTEX      Component Value Date/Time   CHOL 140 01/31/2021 0000   TRIG 133 01/31/2021 0000   HDL 65 01/31/2021 0000   CHOLHDL 2.2 01/31/2021  0000   VLDL 19 03/16/2016 0925   LDLCALC 54 01/31/2021 0000   LDLDIRECT 107 (H) 05/27/2014 1010    Hepatic Function Latest Ref Rng & Units 01/31/2021 12/22/2019 02/12/2019  Total Protein 6.1 - 8.1 g/dL 7.6 7.8 7.3  Albumin 3.6 - 5.1 g/dL - - -  AST 10 - 35 U/L 20 21 20   ALT 6 - 29 U/L 16 17 17   Alk Phosphatase 33 - 130 U/L - - -  Total Bilirubin 0.2 - 1.2 mg/dL 0.9 0.9 0.5    Lab Results  Component Value Date/Time   TSH 2.18 03/29/2020 08:44 AM   TSH 1.940 03/04/2012 10:30 AM    CBC Latest Ref Rng & Units 01/31/2021 03/29/2020 02/11/2016  WBC 3.8 - 10.8 Thousand/uL 10.5 10.9(H) 11.8(H)  Hemoglobin 11.7 - 15.5 g/dL 14.6 14.2 13.6  Hematocrit 35.0 - 45.0 % 43.8 42.1 40.3  Platelets 140 - 400 Thousand/uL 259 293 353    Social History   Tobacco Use  Smoking Status Never  Smokeless Tobacco Never   BP Readings from Last 3 Encounters:  05/02/21 (!) 123/47  01/31/21 109/61  12/27/20 128/77   Pulse Readings from Last 3 Encounters:  05/02/21 77  01/31/21 72  12/27/20 92   Wt Readings from Last 3 Encounters:  05/02/21 153 lb (69.4 kg)  01/31/21 155 lb (70.3 kg)  12/27/20 157 lb (71.2 kg)    Assessment: Review of patient past medical history, allergies, medications, health status, including review of consultants reports, laboratory and other test data, was performed as part of comprehensive evaluation and provision of  chronic care management services.   SDOH:  (Social Determinants of Health) assessments and interventions performed:    CCM Care Plan  Allergies  Allergen Reactions   Ibuprofen Rash   Iodinated Diagnostic Agents Rash    IV Contrast   Sulfa Antibiotics Rash   Atorvastatin Other (See Comments)    HA   Naproxen Sodium    Pravastatin Other (See Comments)    dizziness   Simvastatin Other (See Comments)    Nightmares   Sulfonamide Derivatives     REACTION: rash   Trazodone And Nefazodone Other (See Comments)    Nightmares     Medications Reviewed Today      Reviewed by Hali Marry, MD (Physician) on 05/02/21 at 0844  Med List Status: <None>   Medication Order Taking? Sig Documenting Provider Last Dose Status Informant  Acetaminophen (TYLENOL 8 HOUR ARTHRITIS PAIN PO) [id number] No Take 1 tablet by mouth at bedtime as needed. [provider] Taking Active Self  blood glucose meter kit and supplies [id number] No Dispense based on patient and insurance preference. Use up to four times daily as directed. Dx:E11.9 Hali Marry, MD Taking Active   glucose blood test strip [id number]  TO CHECK GLUCOSE UP TO 4 TIMES DAILY AS DIRECTED. Hali Marry, MD  Active   LEVEMIR FLEXTOUCH 100 UNIT/ML FlexPen [id number] No INJECT 10 TO 20 UNITS SUBCUTANEOUSLY AT BEDTIME  Patient taking differently: 14 Units.   Hali Marry, MD Taking Active            Med Note [name] Mar 07, 2021 11:18 AM) Currently at 20 units per increase, but thinking about backing down.  lisinopril (ZESTRIL) 2.5 MG tablet [id number]  Take 1 tablet by mouth once daily Hali Marry, MD  Active   melatonin 5 MG TABS [id number]  Take 5 mg by mouth at bedtime. 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 08/07/2012   Pancreatic cyst 07/12/2011   Diabetes mellitus, type II ([place]) 05/17/2007   KIDNEY DISEASE, CHRONIC, STAGE III 05/17/2007   ABNORMAL SERUM ENZYME LEVEL NEC  05/16/2007   CARRIER, VIRAL HEPATITIS C 05/16/2007   SCOLIOSIS 06/26/2006    Immunization History  Administered Date(s) Administered   Fluad Quad(high Dose 65+) 06/23/2019   H1N1 07/17/2008   Influenza Split 06/10/2012   Influenza Whole 08/27/2007, 06/09/2008, 06/18/2009, 07/13/2009, 06/20/2010   Influenza, High Dose Seasonal PF 06/12/2017, 06/19/2018, 07/02/2020   Influenza,inj,Quad PF,6+ Mos 05/12/2013, 05/21/2014, 06/03/2015, 06/15/2016   Moderna Sars-Covid-2 Vaccination 12/26/2019, 01/17/2020, 08/16/2020   Pneumococcal Conjugate-13 12/02/2014   Pneumococcal Polysaccharide-23 08/27/2007, 11/20/2011   Td 06/19/2005   Tdap 01/25/2017   Zoster, Live 07/20/2010    Conditions to be addressed/monitored: HLD and DMII  There are no care plans that you recently modified to display for this patient.   Medication Assistance:  TBD.  Previously obtained xigduo PAP but pt was intolerant   Patient's preferred pharmacy is:  [place], [place] - [address] [place] [address] Phone: [phone] Fax: [facility] [address], [place] [address] Phone: [phone] Fax: [phone]  [facility]  ([place]) - [place], [place] [address] Phone: [phone] Fax: [phone]  Uses pill box? Yes Pt endorses 100% compliance  Follow Up:  Patient agrees to Care Plan and Follow-up.  Plan: Telephone follow up appointment with care management team member scheduled for:  2 weeks  Darius Bump

## 2021-05-11 NOTE — Patient Instructions (Signed)
Visit Information  PATIENT GOALS:  Goals Addressed             This Visit's Progress    Medication Management       Patient Goals/Self-Care Activities Over the next 14 days, patient will:  take medications as prescribed and collaborate with provider on medication access solutions  Follow Up Plan: Telephone follow up appointment with care management team member scheduled for:  2 weeks        Patient verbalizes understanding of instructions provided today and agrees to view in [facility].   Telephone follow up appointment with care management team member scheduled for:2 weeks  Darius Bump

## 2021-05-12 MED ORDER — METFORMIN HCL ER 500 MG PO TB24: 1000.0000 mg | ORAL_TABLET | Freq: Every day | ORAL | 1 refills | Status: DC

## 2021-05-12 NOTE — Addendum Note (Signed)
Addended by: Beatrice Lecher D on: 05/12/2021 08:05 AM   Modules accepted: Orders

## 2021-05-16 ENCOUNTER — Telehealth: Payer: Self-pay

## 2021-05-16 NOTE — Telephone Encounter (Signed)
Yes, please discontinue the Xigduo and continue the metformin in its place.  Continue with the 10 units of Levemir.  The rosuvastatin is correct as well please take that at bedtime it is to reduce cholesterol levels.  And reduce risk for heart attack and stroke.

## 2021-05-16 NOTE — Telephone Encounter (Signed)
Pt called to verify that RXs for metformin and rosuvastatin that were sent to the pharmacy were correct.  She states that she has cut back on the Levemir to 10 minutes once daily and she is not feeling any better.  She also wanted to know if she was supposed to discontinue the Xigduo.  Please verify medications.  Charyl Bigger, CMA

## 2021-05-17 NOTE — Telephone Encounter (Signed)
Pt informed.  Pt expressed understanding and is agreeable.  T. Joandry Slagter, CMA  

## 2021-05-24 ENCOUNTER — Ambulatory Visit (INDEPENDENT_AMBULATORY_CARE_PROVIDER_SITE_OTHER): Payer: Medicare Other | Admitting: Pharmacist

## 2021-05-24 ENCOUNTER — Other Ambulatory Visit: Payer: Self-pay

## 2021-05-24 DIAGNOSIS — E119 Type 2 diabetes mellitus without complications: Secondary | ICD-10-CM

## 2021-05-24 DIAGNOSIS — E1169 Type 2 diabetes mellitus with other specified complication: Secondary | ICD-10-CM

## 2021-05-24 DIAGNOSIS — E785 Hyperlipidemia, unspecified: Secondary | ICD-10-CM

## 2021-05-24 NOTE — Progress Notes (Signed)
[facility] BG 135, 133     Chronic Care Management Pharmacy Note  05/25/2021 Name:  Hannah Rojas MRN:  [id number] DOB:  08/20/1946  Summary: addressed HLD and primarily DM. Pt stopped xigduo due to fatigue, now on metformin alone and states she is feeling better.  Recommendations/Changes made from today's visit: Recommend  continue metformin 517m BID for 1 month to adjust to medication change. Next, consider adding ozempic for additional patient personal goal of weight loss. Goal regimen (if tolerates): metformin + ozempic, and STOPPING insulin.  Plan: f/u with pharmacist in 1 month   Subjective: Hannah DILLARDis an 75y.o. year old female who is a primary patient of [name], CRene Kocher MD.  The CCM team was consulted for assistance with disease management and care coordination needs.    Engaged with patient by telephone for follow up visit in response to provider referral for pharmacy case management and/or care coordination services.   Consent to Services:  The patient was given information about Chronic Care Management services, agreed to services, and gave verbal consent prior to initiation of services.  Please see initial visit note for detailed documentation.   Patient Care Team: MHali Marry MD as PCP - General KDarius Bump [facility] Pharmacist (Pharmacist)   Objective:  Lab Results  Component Value Date   CREATININE 1.04 (H) 01/31/2021   CREATININE 1.00 (H) 08/02/2020   CREATININE 1.05 (H) 12/22/2019    Lab Results  Component Value Date   HGBA1C 7.3 (A) 05/02/2021   Last diabetic Eye exam:  Lab Results  Component Value Date/Time   HMDIABEYEEXA No Retinopathy 12/01/2019 12:00 AM    Last diabetic Foot exam: No results found for: HMDIABFOOTEX      Component Value Date/Time   CHOL 140 01/31/2021 0000   TRIG 133 01/31/2021 0000   HDL 65 01/31/2021 0000   CHOLHDL 2.2 01/31/2021 0000   VLDL 19 03/16/2016 0925   LDLCALC 54 01/31/2021  0000   LDLDIRECT 107 (H) 05/27/2014 1010    Hepatic Function Latest Ref Rng & Units 01/31/2021 12/22/2019 02/12/2019  Total Protein 6.1 - 8.1 g/dL 7.6 7.8 7.3  Albumin 3.6 - 5.1 g/dL - - -  AST 10 - 35 U/L _0 ALT 6 - 29 U/L _1 Alk Phosphatase 33 - 130 U/L - - -  Total Bilirubin 0.2 - 1.2 mg/dL 0.9 0.9 0.5    Lab Results  Component Value Date/Time   TSH 2.18 03/29/2020 08:44 AM   TSH 1.940 03/04/2012 10:30 AM    CBC Latest Ref Rng & Units 01/31/2021 03/29/2020 02/11/2016  WBC 3.8 - 10.8 Thousand/uL 10.5 10.9(H) 11.8(H)  Hemoglobin 11.7 - 15.5 g/dL 14.6 14.2 13.6  Hematocrit 35.0 - 45.0 % 43.8 42.1 40.3  Platelets 140 - 400 Thousand/uL 259 293 353    No results found for: VD25OH  Clinical ASCVD: No  The 10-year ASCVD risk score (Mikey BussingDC Jr., et al., 2013) is: 23.3%   Values used to calculate the score:     Age: 75years     Sex: Female     Is Non-Hispanic African American: No     Diabetic: Yes     Tobacco smoker: No     Systolic Blood Pressure: 1935mmHg     Is BP treated: No     HDL Cholesterol: 65 mg/dL     Total Cholesterol: 140 mg/dL     Social History   Tobacco  Use  Smoking Status Never  Smokeless Tobacco Never   BP Readings from Last 3 Encounters:  05/02/21 (!) 123/47  01/31/21 109/61  12/27/20 128/77   Pulse Readings from Last 3 Encounters:  05/02/21 77  01/31/21 72  12/27/20 92   Wt Readings from Last 3 Encounters:  05/02/21 153 lb (69.4 kg)  01/31/21 155 lb (70.3 kg)  12/27/20 157 lb (71.2 kg)    Assessment: Review of patient past medical history, allergies, medications, health status, including review of consultants reports, laboratory and other test data, was performed as part of comprehensive evaluation and provision of chronic care management services.   SDOH:  (Social Determinants of Health) assessments and interventions performed:    CCM Care Plan  Allergies  Allergen Reactions   Ibuprofen Rash   Iodinated Diagnostic Agents  Rash    IV Contrast   Sulfa Antibiotics Rash   Atorvastatin Other (See Comments)    HA   Naproxen Sodium    Pravastatin Other (See Comments)    dizziness   Simvastatin Other (See Comments)    Nightmares   Sulfonamide Derivatives     REACTION: rash   Trazodone And Nefazodone Other (See Comments)    Nightmares    Xigduo Xr [Dapagliflozin-Metformin Hcl Er] Other (See Comments)    Excessive fatigue. Tolerates metformin alone.    Medications Reviewed Today     Reviewed by Darius Bump, [facility] (Pharmacist) on 05/09/21 at 9  Med List Status: <None>   Medication Order Taking? Sig Documenting Provider Last Dose Status Informant  Acetaminophen (TYLENOL 8 HOUR ARTHRITIS PAIN PO) [id number] Yes Take 1 tablet by mouth at bedtime as needed. [provider] Taking Active Self  blood glucose meter kit and supplies [id number] Yes Dispense based on patient and insurance preference. Use up to four times daily as directed. Dx:E11.9 Hali Marry, MD Taking Active   glucose blood test strip [id number] Yes TO CHECK GLUCOSE UP TO 4 TIMES DAILY AS DIRECTED. Hali Marry, MD Taking Active   LEVEMIR FLEXTOUCH 100 UNIT/ML FlexPen [id number] Yes INJECT 10 TO 20 UNITS SUBCUTANEOUSLY AT BEDTIME  Patient taking differently: 14 Units.   Hali Marry, MD Taking Active            Med Note [name] May 09, 2021  2:36 PM) Taking 10 units daily as of 05/09/21 review  lisinopril (ZESTRIL) 2.5 MG tablet [id number] Yes Take 1 tablet by mouth once daily Hali Marry, MD Taking Active   melatonin 5 MG TABS [id number] Yes Take 5 mg by mouth at bedtime. [provider] Taking Active   rosuvastatin (CRESTOR) 20 MG tablet [id number] Yes TAKE 1 TABLET BY MOUTH AT BEDTIME Hali Marry, MD Taking Active   XIGDUO XR 06-999 MG TB24 [id number] Yes Take 1 tablet by mouth once daily with breakfast Hali Marry, MD Taking Active             Patient  Active Problem List   Diagnosis Date Noted   Seasonal allergies 03/29/2020   History of iron deficiency 03/29/2020   Hyperlipidemia associated with type 2 diabetes mellitus ([place]) 03/13/2016   Obesity (BMI 30-39.9) 11/19/2013   History of splenectomy 01/06/2013   Oral lesion - Fibroma/Reactive Changes to tounge 08/07/2012   Pancreatic cyst 07/12/2011   Diabetes mellitus, type II ([place]) 05/17/2007   KIDNEY DISEASE, CHRONIC, STAGE III 05/17/2007   ABNORMAL SERUM ENZYME LEVEL NEC 05/16/2007   CARRIER, VIRAL HEPATITIS C 05/16/2007  SCOLIOSIS 06/26/2006    Immunization History  Administered Date(s) Administered   Fluad Quad(high Dose 65+) 06/23/2019   H1N1 07/17/2008   Influenza Split 06/10/2012   Influenza Whole 08/27/2007, 06/09/2008, 06/18/2009, 07/13/2009, 06/20/2010   Influenza, High Dose Seasonal PF 06/12/2017, 06/19/2018, 07/02/2020   Influenza,inj,Quad PF,6+ Mos 05/12/2013, 05/21/2014, 06/03/2015, 06/15/2016   Moderna Sars-Covid-2 Vaccination 12/26/2019, 01/17/2020, 08/16/2020   Pneumococcal Conjugate-13 12/02/2014   Pneumococcal Polysaccharide-23 08/27/2007, 11/20/2011   Td 06/19/2005   Tdap 01/25/2017   Zoster, Live 07/20/2010    Conditions to be addressed/monitored: HLD and DMII  Care Plan : Medication Management  Updates made by Darius Bump, [place] since 05/25/2021 12:00 AM     Problem: DM, HLD      Long-Range Goal: Disease Progression Prevention   Start Date: 03/07/2021  Recent Progress: On track  Priority: High  Note:   Current Barriers:  Unable to independently afford treatment regimen Pt reports excessive fatigue while on xigduo, feels much more energetic and "overall better" without it  Pharmacist Clinical Goal(s):  Over the next 30 days, patient will adhere to plan to optimize therapeutic regimen for diabetes as evidenced by report of adherence to recommended medication management changes through collaboration with PharmD and provider.    Interventions: 1:1 collaboration with Hali Marry, MD regarding development and update of comprehensive plan of care as evidenced by provider attestation and co-signature Inter-disciplinary care team collaboration (see longitudinal plan of care) Comprehensive medication review performed; medication list updated in electronic medical record  Diabetes:  Uncontrolled; current treatment:Levemir 10 units nightly, stopped xigduo d/t extreme fatigue & restarted metformin alone, states she is feeling better; a1c 7.3  Current glucose readings: 130s fasting glucose. Not currently checking postprandial glucose.  Denies hypoglycemic symptoms   Previously discussed current meal patterns: breakfast: banana; lunch: subway or salad; dinner: snacks on small things or frozen dinner ; snacks: chips, or cheezy's, handful at most; drinks: diet coke zero, water   Current exercise: walking, works full time at [employer]  Previously educated on benefits of low-dose ACEI for renal protection & statin for protection against cardiovascular & cerebrovascular events  Recommend  continue metformin 578m BID for 1 month to adjust to medication change. Next, consider adding ozempic for additional patient personal goal of weight loss. Goal regimen (if tolerates): metformin + ozempic, and STOPPING insulin.  Hyperlipidemia:  Controlled; current treatment:rosuvastatin 231m LDL 54  Recommended continue current regimen  Patient Goals/Self-Care Activities Over the next 30 days, patient will:  take medications as prescribed and collaborate with provider on medication access solutions  Follow Up Plan: Face to face follow up appointment with care management team member scheduled for:  1 month     Medication Assistance:  TBD, likely to do novonordisk for ozempic in future  [place]:  [place] [address] [place]one:  [phone]: [address] [address]one: [phone]: [phone] [address]  ([place] [address] [place]hone: [phone]: [phone] pill box? Yes Pt endorses 100% compliance  Follow Up:  Patient agrees to Care Plan and Follow-up.  Plan: Telephone follow up appointment with care management team member scheduled for:  1 month  KeDarius Bump

## 2021-05-25 NOTE — Patient Instructions (Addendum)
Visit Information  PATIENT GOALS:  Goals Addressed             This Visit's Progress    Medication Management       Patient Goals/Self-Care Activities Over the next 30 days, patient will:  take medications as prescribed and collaborate with provider on medication access solutions  Follow Up Plan: Face to face follow up appointment with care management team member scheduled for:  1 month        Patient verbalizes understanding of instructions provided today and agrees to view in [place].   Face to Face appointment with care management team member scheduled for:  1 month  Hannah Rojas

## 2021-06-17 DIAGNOSIS — E1169 Type 2 diabetes mellitus with other specified complication: Secondary | ICD-10-CM

## 2021-06-17 DIAGNOSIS — E785 Hyperlipidemia, unspecified: Secondary | ICD-10-CM | POA: Diagnosis not present

## 2021-06-17 DIAGNOSIS — E119 Type 2 diabetes mellitus without complications: Secondary | ICD-10-CM

## 2021-06-23 ENCOUNTER — Encounter: Payer: Self-pay | Admitting: Family Medicine

## 2021-07-06 ENCOUNTER — Ambulatory Visit (INDEPENDENT_AMBULATORY_CARE_PROVIDER_SITE_OTHER): Payer: Medicare Other | Admitting: Pharmacist

## 2021-07-06 DIAGNOSIS — E119 Type 2 diabetes mellitus without complications: Secondary | ICD-10-CM

## 2021-07-06 DIAGNOSIS — E1169 Type 2 diabetes mellitus with other specified complication: Secondary | ICD-10-CM

## 2021-07-06 NOTE — Patient Instructions (Signed)
Visit Information  PATIENT GOALS:  Goals Addressed             This Visit's Progress    Medication Management       Patient Goals/Self-Care Activities Over the next 14 days, patient will:  take medications as prescribed and collaborate with provider on medication access solutions  Follow Up Plan: Telephone follow up appointment with care management team member scheduled for:   2-3        Patient verbalizes understanding of instructions provided today and agrees to view in [place].   Telephone follow up appointment with care management team member scheduled for: 2-3 weeks  Hannah Rojas

## 2021-07-06 NOTE — Progress Notes (Signed)
Chronic Care Management Pharmacy Note  07/06/2021 Name:  Hannah Rojas MRN:  [id number] DOB:  04-07-1946  Summary: addressed HLD and primarily DM. Pt stopped xigduo due to fatigue, now on metformin + levemir 10 units nightly.   Recommendations/Changes made from today's visit: Beginning ozempic with goal of stopping insulin. Begin 0.58m weekly, provided sample at today's visit and counseled on administration technique. Patient plans to start first dose Monday 07/11/21. Also provided patient with paperwork for ozempic cost assistance, she will fill out and return.  Plan: f/u with pharmacist in 2-3 weeks  Subjective: Hannah ACKERTis an 75y.o. year old female who is a primary patient of [name], CRene Kocher MD.  The CCM team was consulted for assistance with disease management and care coordination needs.    Engaged with patient face to face for follow up visit in response to provider referral for pharmacy case management and/or care coordination services.   Consent to Services:  The patient was given information about Chronic Care Management services, agreed to services, and gave verbal consent prior to initiation of services.  Please see initial visit note for detailed documentation.   Patient Care Team: MHali Marry MD as PCP - General KDarius Bump [facility]St[facility]as Pharmac[facility] (Pharmacist)   Objective:  Lab Results  Component Value Date   CREATININE 1.04 (H) 01/31/2021   CREATININE 1.00 (H) 08/02/2020   CREATININE 1.05 (H) 12/22/2019    Lab Results  Component Value Date   HGBA1C 7.3 (A) 05/02/2021   Last diabetic Eye exam:  Lab Results  Component Value Date/Time   HMDIABEYEEXA No Retinopathy 12/01/2019 12:00 AM    Last diabetic Foot exam: No results found for: HMDIABFOOTEX      Component Value Date/Time   CHOL 140 01/31/2021 0000   TRIG 133 01/31/2021 0000   HDL 65 01/31/2021 0000   CHOLHDL 2.2 01/31/2021 0000   VLDL 19 03/16/2016 0925   LDLCALC  54 01/31/2021 0000   LDLDIRECT 107 (H) 05/27/2014 1010    Hepatic Function Latest Ref Rng & Units 01/31/2021 12/22/2019 02/12/2019  Total Protein 6.1 - 8.1 g/dL 7.6 7.8 7.3  Albumin 3.6 - 5.1 g/dL - - -  AST 10 - 35 U/L 20 21 20   ALT 6 - 29 U/L 16 17 17   Alk Phosphatase 33 - 130 U/L - - -  Total Bilirubin 0.2 - 1.2 mg/dL 0.9 0.9 0.5    Lab Results  Component Value Date/Time   TSH 2.18 03/29/2020 08:44 AM   TSH 1.940 03/04/2012 10:30 AM    CBC Latest Ref Rng & Units 01/31/2021 03/29/2020 02/11/2016  WBC 3.8 - 10.8 Thousand/uL 10.5 10.9(H) 11.8(H)  Hemoglobin 11.7 - 15.5 g/dL 14.6 14.2 13.6  Hematocrit 35.0 - 45.0 % 43.8 42.1 40.3  Platelets 140 - 400 Thousand/uL 259 293 353    No results found for: VD25OH  Clinical ASCVD: No  The 10-year ASCVD risk score (Arnett DK, et al., 2019) is: 23.3%   Values used to calculate the score:     Age: 75years     Sex: Female     Is Non-Hispanic African American: No     Diabetic: Yes     Tobacco smoker: No     Systolic Blood Pressure: 1599mmHg     Is BP treated: No     HDL Cholesterol: 65 mg/dL     Total Cholesterol: 140 mg/dL     Social History   Tobacco Use  Smoking Status  Never  Smokeless Tobacco Never   BP Readings from Last 3 Encounters:  05/02/21 (!) 123/47  01/31/21 109/61  12/27/20 128/77   Pulse Readings from Last 3 Encounters:  05/02/21 77  01/31/21 72  12/27/20 92   Wt Readings from Last 3 Encounters:  05/02/21 153 lb (69.4 kg)  01/31/21 155 lb (70.3 kg)  12/27/20 157 lb (71.2 kg)    Assessment: Review of patient past medical history, allergies, medications, health status, including review of consultants reports, laboratory and other test data, was performed as part of comprehensive evaluation and provision of chronic care management services.   SDOH:  (Social Determinants of Health) assessments and interventions performed:    CCM Care Plan  Allergies  Allergen Reactions   Ibuprofen Rash   Iodinated  Diagnostic Agents Rash    IV Contrast   Sulfa Antibiotics Rash   Atorvastatin Other (See Comments)    HA   Naproxen Sodium    Pravastatin Other (See Comments)    dizziness   Simvastatin Other (See Comments)    Nightmares   Sulfonamide Derivatives     REACTION: rash   Trazodone And Nefazodone Other (See Comments)    Nightmares    Xigduo Xr [Dapagliflozin-Metformin Hcl Er] Other (See Comments)    Excessive fatigue. Tolerates metformin alone.    Medications Reviewed Today     Reviewed by Darius Bump, [facility] (Pharmacist) on 07/06/21 at 1539  Med List Status: <None>   Medication Order Taking? Sig Documenting Provider Last Dose Status Informant  Acetaminophen (TYLENOL 8 HOUR ARTHRITIS PAIN PO) [id number] Yes Take 1 tablet by mouth at bedtime as needed. [provider] Taking Active Self  blood glucose meter kit and supplies [id number] Yes Dispense based on patient and insurance preference. Use up to four times daily as directed. Dx:E11.9 Hali Marry, MD Taking Active   glucose blood test strip [id number] Yes TO CHECK GLUCOSE UP TO 4 TIMES DAILY AS DIRECTED. Hali Marry, MD Taking Active   LEVEMIR FLEXTOUCH 100 UNIT/ML FlexPen [id number] Yes INJECT 10 TO 20 UNITS SUBCUTANEOUSLY AT BEDTIME  Patient taking differently: 14 Units.   Hali Marry, MD Taking Active            Med Note [name] May 09, 2021  2:36 PM) Taking 10 units daily as of 05/09/21 review  lisinopril (ZESTRIL) 2.5 MG tablet [id number] Yes Take 1 tablet by mouth once daily Hali Marry, MD Taking Active   melatonin 5 MG TABS [id number] Yes Take 5 mg by mouth at bedtime. [provider] Taking Active   metFORMIN (GLUCOPHAGE-XR) 500 MG 24 hr tablet [id number] Yes Take 2 tablets (1,000 mg total) by mouth daily with breakfast. Hali Marry, MD Taking Active   rosuvastatin (CRESTOR) 20 MG tablet [id number] Yes TAKE 1 TABLET BY MOUTH AT BEDTIME Hali Marry, MD Taking Active             Patient Active Problem List   Diagnosis Date Noted   Seasonal allergies 03/29/2020   History of iron deficiency 03/29/2020   Hyperlipidemia associated with type 2 diabetes mellitus ([place]) 03/13/2016   Obesity (BMI 30-39.9) 11/19/2013   History of splenectomy 01/06/2013   Oral lesion - Fibroma/Reactive Changes to tounge 08/07/2012   Pancreatic cyst 07/12/2011   Diabetes mellitus, type II ([place]) 05/17/2007   KIDNEY DISEASE, CHRONIC, STAGE III 05/17/2007   ABNORMAL SERUM ENZYME LEVEL NEC 05/16/2007   CARRIER, VIRAL HEPATITIS C 05/16/2007  SCOLIOSIS 06/26/2006    Immunization History  Administered Date(s) Administered   Fluad Quad(high Dose 65+) 06/23/2019   H1N1 07/17/2008   Influenza Split 06/10/2012   Influenza Whole 08/27/2007, 06/09/2008, 06/18/2009, 07/13/2009, 06/20/2010   Influenza, High Dose Seasonal PF 06/12/2017, 06/19/2018, 07/02/2020   Influenza,inj,Quad PF,6+ Mos 05/12/2013, 05/21/2014, 06/03/2015, 06/15/2016   Moderna Sars-Covid-2 Vaccination 12/26/2019, 01/17/2020, 08/16/2020   Pneumococcal Conjugate-13 12/02/2014   Pneumococcal Polysaccharide-23 08/27/2007, 11/20/2011   Td 06/19/2005   Tdap 01/25/2017   Zoster, Live 07/20/2010    Conditions to be addressed/monitored: HLD and DMII  Care Plan : Medication Management  Updates made by Darius Bump, [place] since 07/06/2021 12:00 AM     Problem: DM, HLD      Long-Range Goal: Disease Progression Prevention   Start Date: 03/07/2021  Recent Progress: On track  Priority: High  Note:   Current Barriers:  Unable to independently afford treatment regimen Pt reports excessive fatigue while on xigduo, feels much more energetic and "overall better" without it and now using metformin and tolerating well  Pharmacist Clinical Goal(s):  Over the next 14 days, patient will adhere to plan to optimize therapeutic regimen for diabetes as evidenced by report of adherence to  recommended medication management changes through collaboration with PharmD and provider.   Interventions: 1:1 collaboration with Hali Marry, MD regarding development and update of comprehensive plan of care as evidenced by provider attestation and co-signature Inter-disciplinary care team collaboration (see longitudinal plan of care) Comprehensive medication review performed; medication list updated in electronic medical record  Diabetes:  Uncontrolled; current treatment:Levemir 10 units nightly, metformin 521m BID  Current glucose readings: 130s fasting glucose. Not currently checking postprandial glucose.  Denies hypoglycemic symptoms   Previously discussed current meal patterns: breakfast: banana; lunch: subway or salad; dinner: snacks on small things or frozen dinner ; snacks: chips, or cheezy's, handful at most; drinks: diet coke zero, water   Current exercise: walking, works full time at [employer] Previously educated on benefits of low-dose ACEI for renal protection & statin for protection against cardiovascular & cerebrovascular events  Recommend initiate ozempic 0.231mweekly with goal of stopping insulin. Provided sample to begin, patient states will start Monday 07/11/21. Patient provided cost assistance paperwork to fill out and return for ozempic coverage.  Hyperlipidemia:  Controlled; current treatment:rosuvastatin 2025mLDL 54  Recommended continue current regimen  Patient Goals/Self-Care Activities Over the next 14 days, patient will:  take medications as prescribed and collaborate with provider on medication access solutions  Follow Up Plan: Telephone follow up appointment with care management team member scheduled for: 2-3 weeks      Medication Assistance: Application for ozempic  medication assistance program. in process.  Anticipated assistance start date TBD.  See plan of care for additional detail.  [facility] [place]one: [phone]: [phone] pill box? Yes Pt endorses 100% compliance  Follow Up:  Patient agrees to Care Plan and Follow-up.  Plan: Telephone follow up appointment with care management team member scheduled for:  2 -3 weeks  KeeLarinda ButteryharmD Clinical Pharmacist [phone]

## 2021-07-16 ENCOUNTER — Other Ambulatory Visit: Payer: Self-pay | Admitting: Family Medicine

## 2021-07-18 ENCOUNTER — Ambulatory Visit: Payer: Medicare Other | Admitting: Pharmacist

## 2021-07-18 ENCOUNTER — Other Ambulatory Visit: Payer: Self-pay

## 2021-07-18 DIAGNOSIS — E1169 Type 2 diabetes mellitus with other specified complication: Secondary | ICD-10-CM | POA: Diagnosis not present

## 2021-07-18 DIAGNOSIS — E119 Type 2 diabetes mellitus without complications: Secondary | ICD-10-CM | POA: Diagnosis not present

## 2021-07-18 DIAGNOSIS — E785 Hyperlipidemia, unspecified: Secondary | ICD-10-CM | POA: Diagnosis not present

## 2021-07-18 NOTE — Patient Instructions (Signed)
Visit Information  PATIENT GOALS:  Goals Addressed             This Visit's Progress    Medication Management       Patient Goals/Self-Care Activities Over the next 14 days, patient will:  take medications as prescribed and collaborate with provider on medication access solutions  Follow Up Plan: Telephone follow up appointment with care management team member scheduled for:   2-3 weeks        Patient verbalizes understanding of instructions provided today and agrees to view in [place].   Telephone follow up appointment with care management team member scheduled for: 2-3 weeks  Hannah Rojas

## 2021-07-18 NOTE — Progress Notes (Signed)
Chronic Care Management Pharmacy Note  07/18/2021 Name:  KHYLIE LARMORE MRN:  [id number] DOB:  1946/09/16  Summary: addressed HLD and primarily DM. Patient is on metformin + levemir 10 units nightly, and began ozempic 0.51m weekly on 07/11/21. Doing well with no side effects! She will be dropping off her completed patient assistance application soon.  Recommendations/Changes made from today's visit: Continue ozempic with goal of stopping insulin. Due to increase to 0.515mweekly (using same sample pen) on 08/08/21.  Plan: f/u with pharmacist in 2-3 weeks  Subjective: PhDHRUVI CRENSHAWs an [age].o. year old female who is a primary patient of [name], CaRene KocherMD.  The CCM team was consulted for assistance with disease management and care coordination needs.    Engaged with patient by telephone for follow up visit in response to provider referral for pharmacy case management and/or care coordination services.   Consent to Services:  The patient was given information about Chronic Care Management services, agreed to services, and gave verbal consent prior to initiation of services.  Please see initial visit note for detailed documentation.   Patient Care Team: MeHali MarryMD as PCP - General [facility] Pharmacist (Pharmacist)   Objective:  Lab Results  Component Value Date   CREATININE 1.04 (H) 01/31/2021   CREATININE 1.00 (H) 08/02/2020   CREATININE 1.05 (H) 12/22/2019    Lab Results  Component Value Date   HGBA1C 7.3 (A) 05/02/2021   Last diabetic Eye exam:  Lab Results  Component Value Date/Time   HMDIABEYEEXA No Retinopathy 12/01/2019 12:00 AM    Last diabetic Foot exam: No results found for: HMDIABFOOTEX      Component Value Date/Time   CHOL 140 01/31/2021 0000   TRIG 133 01/31/2021 0000   HDL 65 01/31/2021 0000   CHOLHDL 2.2 01/31/2021 0000   VLDL 19 03/16/2016 0925   LDLCALC 54 01/31/2021 0000   LDLDIRECT 107 (H)  05/27/2014 1010    Hepatic Function Latest Ref Rng & Units 01/31/2021 12/22/2019 02/12/2019  Total Protein 6.1 - 8.1 g/dL 7.6 7.8 7.3  Albumin 3.6 - 5.1 g/dL - - -  AST 10 - 35 U/L 20 21 20   ALT 6 - 29 U/L 16 17 17   Alk Phosphatase 33 - 130 U/L - - -  Total Bilirubin 0.2 - 1.2 mg/dL 0.9 0.9 0.5    Lab Results  Component Value Date/Time   TSH 2.18 03/29/2020 08:44 AM   TSH 1.940 03/04/2012 10:30 AM    CBC Latest Ref Rng & Units 01/31/2021 03/29/2020 02/11/2016  WBC 3.8 - 10.8 Thousand/uL 10.5 10.9(H) 11.8(H)  Hemoglobin 11.7 - 15.5 g/dL 14.6 14.2 13.6  Hematocrit 35.0 - 45.0 % 43.8 42.1 40.3  Platelets 140 - 400 Thousand/uL 259 293 353    No results found for: VD25OH  Clinical ASCVD: No  The 10-year ASCVD risk score (Arnett DK, et al., 2019) is: 23.3%   Values used to calculate the score:     Age: [age]     Sex: Female     Is Non-Hispanic African American: No     Diabetic: Yes     Tobacco smoker: No     Systolic Blood Pressure: 12195mHg     Is BP treated: No     HDL Cholesterol: 65 mg/dL     Total Cholesterol: 140 mg/dL     Social History   Tobacco Use  Smoking Status Never  Smokeless Tobacco Never  BP Readings from Last 3 Encounters:  05/02/21 (!) 123/47  01/31/21 109/61  12/27/20 128/77   Pulse Readings from Last 3 Encounters:  05/02/21 77  01/31/21 72  12/27/20 92   Wt Readings from Last 3 Encounters:  05/02/21 153 lb (69.4 kg)  01/31/21 155 lb (70.3 kg)  12/27/20 157 lb (71.2 kg)    Assessment: Review of patient past medical history, allergies, medications, health status, including review of consultants reports, laboratory and other test data, was performed as part of comprehensive evaluation and provision of chronic care management services.   SDOH:  (Social Determinants of Health) assessments and interventions performed:    CCM Care Plan  Allergies  Allergen Reactions   Ibuprofen Rash   Iodinated Diagnostic Agents Rash    IV Contrast    Sulfa Antibiotics Rash   Atorvastatin Other (See Comments)    HA   Naproxen Sodium    Pravastatin Other (See Comments)    dizziness   Simvastatin Other (See Comments)    Nightmares   Sulfonamide Derivatives     REACTION: rash   Trazodone And Nefazodone Other (See Comments)    Nightmares    Xigduo Xr [Dapagliflozin-Metformin Hcl Er] Other (See Comments)    Excessive fatigue. Tolerates metformin alone.    Medications Reviewed Today     Reviewed by Darius Bump, [facility] (Pharmacist) on 07/06/21 at 1539  Med List Status: <None>   Medication Order Taking? Sig Documenting Provider Last Dose Status Informant  Acetaminophen (TYLENOL 8 HOUR ARTHRITIS PAIN PO) [id number] Yes Take 1 tablet by mouth at bedtime as needed. [provider] Taking Active Self  blood glucose meter kit and supplies [id number] Yes Dispense based on patient and insurance preference. Use up to four times daily as directed. Dx:E11.9 Hali Marry, MD Taking Active   glucose blood test strip [id number] Yes TO CHECK GLUCOSE UP TO 4 TIMES DAILY AS DIRECTED. Hali Marry, MD Taking Active   LEVEMIR FLEXTOUCH 100 UNIT/ML FlexPen [id number] Yes INJECT 10 TO 20 UNITS SUBCUTANEOUSLY AT BEDTIME  Patient taking differently: 14 Units.   Hali Marry, MD Taking Active            Med Note [name] May 09, 2021  2:36 PM) Taking 10 units daily as of 05/09/21 review  lisinopril (ZESTRIL) 2.5 MG tablet [id number] Yes Take 1 tablet by mouth once daily Hali Marry, MD Taking Active   melatonin 5 MG TABS [id number] Yes Take 5 mg by mouth at bedtime. [provider] Taking Active   metFORMIN (GLUCOPHAGE-XR) 500 MG 24 hr tablet [id number] Yes Take 2 tablets (1,000 mg total) by mouth daily with breakfast. Hali Marry, MD Taking Active   rosuvastatin (CRESTOR) 20 MG tablet [id number] Yes TAKE 1 TABLET BY MOUTH AT BEDTIME Hali Marry, MD Taking Active              Patient Active Problem List   Diagnosis Date Noted   Seasonal allergies 03/29/2020   History of iron deficiency 03/29/2020   Hyperlipidemia associated with type 2 diabetes mellitus ([place]) 03/13/2016   Obesity (BMI 30-39.9) 11/19/2013   History of splenectomy 01/06/2013   Oral lesion - Fibroma/Reactive Changes to tounge 08/07/2012   Pancreatic cyst 07/12/2011   Diabetes mellitus, type II ([place]) 05/17/2007   KIDNEY DISEASE, CHRONIC, STAGE III 05/17/2007   ABNORMAL SERUM ENZYME LEVEL NEC 05/16/2007   CARRIER, VIRAL HEPATITIS C 05/16/2007   SCOLIOSIS 06/26/2006  Immunization History  Administered Date(s) Administered   Fluad Quad(high Dose 65+) 06/23/2019   H1N1 07/17/2008   Influenza Split 06/10/2012   Influenza Whole 08/27/2007, 06/09/2008, 06/18/2009, 07/13/2009, 06/20/2010   Influenza, High Dose Seasonal PF 06/12/2017, 06/19/2018, 07/02/2020   Influenza,inj,Quad PF,6+ Mos 05/12/2013, 05/21/2014, 06/03/2015, 06/15/2016   Moderna Sars-Covid-2 Vaccination 12/26/2019, 01/17/2020, 08/16/2020   Pneumococcal Conjugate-13 12/02/2014   Pneumococcal Polysaccharide-23 08/27/2007, 11/20/2011   Td 06/19/2005   Tdap 01/25/2017   Zoster, Live 07/20/2010    Conditions to be addressed/monitored: HLD and DMII  Care Plan : Medication Management  Updates made by Darius Bump, [place] since 07/18/2021 12:00 AM     Problem: DM, HLD      Long-Range Goal: Disease Progression Prevention   Start Date: 03/07/2021  Recent Progress: On track  Priority: High  Note:   Current Barriers:  Unable to independently afford treatment regimen Pt reports excessive fatigue while on xigduo, feels much more energetic and "overall better" without it and now using metformin and tolerating well  Pharmacist Clinical Goal(s):  Over the next 14 days, patient will adhere to plan to optimize therapeutic regimen for diabetes as evidenced by report of adherence to recommended medication management changes through  collaboration with PharmD and provider.   Interventions: 1:1 collaboration with Hali Marry, MD regarding development and update of comprehensive plan of care as evidenced by provider attestation and co-signature Inter-disciplinary care team collaboration (see longitudinal plan of care) Comprehensive medication review performed; medication list updated in electronic medical record  Diabetes:  Uncontrolled; current treatment:Levemir 10 units nightly, metformin 567m BID, ozempic 0.233mweekly (doing well, no side effects)  Current glucose readings: 130s fasting glucose. Not currently checking postprandial glucose.  Denies hypoglycemic symptoms   Previously discussed current meal patterns: breakfast: banana; lunch: subway or salad; dinner: snacks on small things or frozen dinner ; snacks: chips, or cheezy's, handful at most; drinks: diet coke zero, water   Current exercise: walking, works full time at [employer] educated on benefits of low-dose ACEI for renal protection & statin for protection against cardiovascular & cerebrovascular events  Recommend continue ozempic 0.2562meekly with goal of stopping insulin. Provided sample to begin, patient states will start Monday 07/11/21. Patient provided cost assistance paperwork to fill out and return for ozempic coverage.  Hyperlipidemia:  Controlled; current treatment:rosuvastatin 61m34mDL 54  Recommended continue current regimen  Patient Goals/Self-Care Activities Over the next 14 days, patient will:  take medications as prescribed and collaborate with provider on medication access solutions  Follow Up Plan: Telephone follow up appointment with care management team member scheduled for: 2-3 weeks       Medication Assistance: Application for ozempic  medication assistance program. in process.  Anticipated assistance start date TBD.  See plan of care for additional detail.  [facility]  [facility] [id number]: [phone]: [phone] pill box? Yes Pt endorses 100% compliance  Follow Up:  Patient agrees to Care Plan and Follow-up.  Plan: Telephone follow up appointment with care management team member scheduled for:  2 -3 weeks  KeesLarinda ButteryarmD Clinical Pharmacist [facility] [place]-[phone]

## 2021-07-22 ENCOUNTER — Other Ambulatory Visit: Payer: Self-pay | Admitting: Family Medicine

## 2021-07-22 ENCOUNTER — Other Ambulatory Visit: Payer: Self-pay | Admitting: *Deleted

## 2021-07-22 DIAGNOSIS — E119 Type 2 diabetes mellitus without complications: Secondary | ICD-10-CM

## 2021-08-01 ENCOUNTER — Encounter: Payer: Self-pay | Admitting: Family Medicine

## 2021-08-01 ENCOUNTER — Other Ambulatory Visit: Payer: Self-pay

## 2021-08-01 ENCOUNTER — Ambulatory Visit (INDEPENDENT_AMBULATORY_CARE_PROVIDER_SITE_OTHER): Payer: Medicare Other | Admitting: Family Medicine

## 2021-08-01 VITALS — BP 104/66 | HR 85 | Ht 61.0 in | Wt 155.0 lb

## 2021-08-01 DIAGNOSIS — Z23 Encounter for immunization: Secondary | ICD-10-CM

## 2021-08-01 DIAGNOSIS — N1831 Chronic kidney disease, stage 3a: Secondary | ICD-10-CM | POA: Diagnosis not present

## 2021-08-01 DIAGNOSIS — E119 Type 2 diabetes mellitus without complications: Secondary | ICD-10-CM

## 2021-08-01 LAB — POCT GLYCOSYLATED HEMOGLOBIN (HGB A1C): Hemoglobin A1C: 8.3 % — AB (ref 4.0–5.6)

## 2021-08-01 NOTE — Assessment & Plan Note (Signed)
Due to recheck renal function. 

## 2021-08-01 NOTE — Assessment & Plan Note (Signed)
A1c elevated today at 8.3.  She just started the Ozempic 2 weeks ago and we discussed that it does take about 2 to 3 weeks to reach efficacy.  After 1 month on the 0.25 she will go ahead and increase to 0.5.  If her blood sugars are starting to drop below 100 then she can stop her Levemir completely.

## 2021-08-01 NOTE — Patient Instructions (Signed)
After 1 month on the 0.25 mg dose okay to increase to 0.5 mg.  If you start noticing blood sugars less than 100 then okay to stop your Levemir.

## 2021-08-01 NOTE — Progress Notes (Signed)
Established Patient Office Visit  Subjective:  Patient ID: Hannah Rojas, female    DOB: 23-Feb-1946  Age: 75 y.o. MRN: [id number]  CC:  Chief Complaint  Patient presents with   Diabetes    HPI EVELLYN TUFF presents for   Diabetes - no hypoglycemic events. No wounds or sores that are not healing well. No increased thirst or urination. Checking glucose at home. Taking medications as prescribed without any side effects.  Doing really well on Ozempic thus far.  She does feel like it is already starting to help curb her appetite which she is pretty excited about.  She just started it 2 weeks ago.  She is down to 8 units on her Levemir.  F/U CKD 3 - no changes.  Currently on lisinopril 2.5 mg daily.   Past Medical History:  Diagnosis Date   Cataracts, bilateral    bilateral   H. pylori infection    history   Pancreatic cyst    hx mucinous cystoadenoma    Past Surgical History:  Procedure Laterality Date   CHOLECYSTECTOMY     ESOPHAGOGASTRODUODENOSCOPY     PANCREATIC CYST EXCISION     spleenectomy      No family history on file.  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 She takes care of grandchildren through out the week. She likes to go for walks in her free time.   Social Determinants of Health   Financial Resource Strain: Low Risk    Difficulty of Paying Living Expenses: Not hard at all  Food Insecurity: No Food Insecurity   Worried About Charity fundraiser in  the Last Year: Never true   [place] in the Last Year: Never true  Transportation Needs: No Transportation Needs   Lack of Transportation (Medical): No   Lack of Transportation (Non-Medical): No  Physical Activity: Insufficiently Active   Days of Exercise per Week: 1 day   Minutes of Exercise per Session: 20 min  Stress: No Stress Concern Present   Feeling of Stress : Not at all  Social Connections: Moderately Isolated   Frequency of Communication with Friends and Family: More than three times a week   Frequency of Social Gatherings with Friends and Family: Once a week   Attends Religious Services: More than 4 times per year   Active Member of [employer] or Organizations: No   Attends Archivist Meetings: Never   Marital Status: Widowed  Human resources officer Violence: Not At Risk   Fear of Current or Ex-Partner: No   Emotionally Abused: No   Physically Abused: No   Sexually Abused: No    Outpatient Medications Prior to Visit  Medication Sig Dispense Refill   Acetaminophen (TYLENOL 8 HOUR ARTHRITIS PAIN PO) Take 1 tablet by mouth at bedtime as needed.     blood glucose meter kit and supplies Dispense  based on patient and insurance preference. Use up to four times daily as directed. Dx:E11.9 1 each 0   glucose blood test strip TO CHECK GLUCOSE UP TO 4 TIMES DAILY AS DIRECTED. 300 each 12   LEVEMIR FLEXTOUCH 100 UNIT/ML FlexPen INJECT 10 TO 20 UNITS SUBCUTANEOUSLY AT BEDTIME (Patient taking differently: 14 Units.) 15 mL 3   lisinopril (ZESTRIL) 2.5 MG tablet Take 1 tablet by mouth once daily 90 tablet 0   melatonin 5 MG TABS Take 5 mg by mouth at bedtime.     metFORMIN (GLUCOPHAGE-XR) 500 MG 24 hr tablet TAKE 2 TABLETS BY MOUTH ONCE DAILY WITH BREAKFAST 60 tablet 0   rosuvastatin (CRESTOR) 20 MG tablet TAKE 1 TABLET BY MOUTH AT BEDTIME 90 tablet 0   Semaglutide,0.25 or 0.5MG/DOS, (OZEMPIC, 0.25 OR 0.5 MG/DOSE,) 2 MG/1.5ML SOPN Inject into the skin.     No  facility-administered medications prior to visit.    Allergies  Allergen Reactions   Ibuprofen Rash   Iodinated Diagnostic Agents Rash    IV Contrast   Sulfa Antibiotics Rash   Atorvastatin Other (See Comments)    HA   Naproxen Sodium    Pravastatin Other (See Comments)    dizziness   Simvastatin Other (See Comments)    Nightmares   Sulfonamide Derivatives     REACTION: rash   Trazodone And Nefazodone Other (See Comments)    Nightmares    Xigduo Xr [Dapagliflozin-Metformin Hcl Er] Other (See Comments)    Excessive fatigue. Tolerates metformin alone.    ROS Review of Systems    Objective:    Physical Exam Constitutional:      Appearance: Normal appearance. She is well-developed.  HENT:     Head: Normocephalic and atraumatic.  Cardiovascular:     Rate and Rhythm: Normal rate and regular rhythm.     Heart sounds: Normal heart sounds.  Pulmonary:     Effort: Pulmonary effort is normal.     Breath sounds: Normal breath sounds.  Skin:    General: Skin is warm and dry.  Neurological:     Mental Status: She is alert and oriented to person, place, and time.  Psychiatric:        Behavior: Behavior normal.    BP 104/66   Pulse 85   Ht 5' 1" (1.549 m)   Wt 155 lb (70.3 kg)   SpO2 94%   BMI 29.29 kg/m  Wt Readings from Last 3 Encounters:  08/01/21 155 lb (70.3 kg)  05/02/21 153 lb (69.4 kg)  01/31/21 155 lb (70.3 kg)     There are no preventive care reminders to display for this patient.  There are no preventive care reminders to display for this patient.  Lab Results  Component Value Date   TSH 2.18 03/29/2020   Lab Results  Component Value Date   WBC 10.5 01/31/2021   HGB 14.6 01/31/2021   HCT 43.8 01/31/2021   MCV 91.1 01/31/2021   PLT 259 01/31/2021   Lab Results  Component Value Date   NA 140 01/31/2021   K 4.6 01/31/2021   CO2 25 01/31/2021   GLUCOSE 130 (H) 01/31/2021   BUN 22 01/31/2021   CREATININE 1.04 (H) 01/31/2021   BILITOT 0.9  01/31/2021   ALKPHOS 63 02/07/2017   AST 20 01/31/2021   ALT 16 01/31/2021   PROT 7.6 01/31/2021   ALBUMIN 4.3 02/07/2017   CALCIUM 9.8 01/31/2021   Lab Results  Component Value Date   CHOL 140  01/31/2021   Lab Results  Component Value Date   HDL 65 01/31/2021   Lab Results  Component Value Date   LDLCALC 54 01/31/2021   Lab Results  Component Value Date   TRIG 133 01/31/2021   Lab Results  Component Value Date   CHOLHDL 2.2 01/31/2021   Lab Results  Component Value Date   HGBA1C 8.3 (A) 08/01/2021      Assessment & Plan:   Problem List Items Addressed This Visit       Endocrine   Diabetes mellitus, type II ([place]) - Primary    A1c elevated today at 8.3.  She just started the Ozempic 2 weeks ago and we discussed that it does take about 2 to 3 weeks to reach efficacy.  After 1 month on the 0.25 she will go ahead and increase to 0.5.  If her blood sugars are starting to drop below 100 then she can stop her Levemir completely.      Relevant Medications   Semaglutide,0.25 or 0.5MG/DOS, (OZEMPIC, 0.25 OR 0.5 MG/DOSE,) 2 MG/1.5ML SOPN   Other Relevant Orders   POCT glycosylated hemoglobin (Hb A1C) (Completed)   BASIC METABOLIC PANEL WITH GFR     Genitourinary   KIDNEY DISEASE, CHRONIC, STAGE III    Due to recheck renal function.      Other Visit Diagnoses     Need for immunization against influenza       Relevant Orders   Flu Vaccine QUAD High Dose(Fluad) (Completed)       No orders of the defined types were placed in this encounter.   Follow-up: Return in about 3 months (around 11/01/2021) for Diabetes follow-up.    Beatrice Lecher, MD

## 2021-08-02 LAB — BASIC METABOLIC PANEL WITH GFR
BUN/Creatinine Ratio: 14 (calc) (ref 6–22)
BUN: 15 mg/dL (ref 7–25)
CO2: 28 mmol/L (ref 20–32)
Calcium: 10.4 mg/dL (ref 8.6–10.4)
Chloride: 102 mmol/L (ref 98–110)
Creat: 1.1 mg/dL — ABNORMAL HIGH (ref 0.60–1.00)
Glucose, Bld: 119 mg/dL — ABNORMAL HIGH (ref 65–99)
Potassium: 5.2 mmol/L (ref 3.5–5.3)
Sodium: 140 mmol/L (ref 135–146)
eGFR: 53 mL/min/{1.73_m2} — ABNORMAL LOW (ref >=60)

## 2021-08-02 NOTE — Progress Notes (Signed)
Your lab work is within acceptable range and there are no concerning findings.   ?

## 2021-08-03 ENCOUNTER — Other Ambulatory Visit: Payer: Self-pay | Admitting: Family Medicine

## 2021-08-05 ENCOUNTER — Other Ambulatory Visit: Payer: Self-pay | Admitting: *Deleted

## 2021-08-05 DIAGNOSIS — E119 Type 2 diabetes mellitus without complications: Secondary | ICD-10-CM

## 2021-08-05 MED ORDER — LEVEMIR FLEXTOUCH 100 UNIT/ML ~~LOC~~ SOPN: 10.0000 [IU] | PEN_INJECTOR | Freq: Every day | SUBCUTANEOUS | 3 refills | Status: DC

## 2021-08-08 ENCOUNTER — Telehealth: Payer: Self-pay | Admitting: Pharmacist

## 2021-08-08 ENCOUNTER — Telehealth: Payer: Medicare Other

## 2021-08-08 NOTE — Progress Notes (Deleted)
Due to start 0.28m weekly today Chronic Care Management Pharmacy Note  08/08/2021 Name:  Hannah Rojas:  [id number]:  101-Jul-1947 Summary: addressed HLD and primarily DM. Patient is on metformin + levemir 10 units nightly, and began ozempic 0.2109mweekly on 07/11/21. Doing well with no side effects! She will be dropping off her completed patient assistance application soon.  Recommendations/Changes made from today's visit: Continue ozempic with goal of stopping insulin. Due to increase to 0.63m92meekly (using same sample pen) on 08/08/21.  Plan: f/u with pharmacist in 2-3 weeks  Subjective: PhyDYASIA Hannah Rojas an 75 39o. year old female who is a primary patient of Hannah Rojas, Hannah Rojas.  The CCM team was consulted for assistance with disease management and care coordination needs.    Engaged with patient by telephone for follow up visit in response to provider referral for pharmacy case management and/or care coordination services.   Consent to Services:  The patient was given information about Chronic Care Management services, agreed to services, and gave verbal consent prior to initiation of services.  Please see initial visit note for detailed documentation.   Patient Care Team: [name] as PCP - [facility] (Pharmacist)   Objective:  Lab Results  Component Value Date   CREATININE 1.10 (H) 08/01/2021   CREATININE 1.04 (H) 01/31/2021   CREATININE 1.00 (H) 08/02/2020    Lab Results  Component Value Date   HGBA1C 8.3 (A) 08/01/2021   Last diabetic Eye exam:  Lab Results  Component Value Date/Time   HMDIABEYEEXA No Retinopathy 12/01/2019 12:00 AM    Last diabetic Foot exam: No results found for: HMDIABFOOTEX      Component Value Date/Time   CHOL 140 01/31/2021 0000   TRIG 133 01/31/2021 0000   HDL 65 01/31/2021 0000   CHOLHDL 2.2 01/31/2021 0000   VLDL 19 03/16/2016 0925   LDLCALC 54 01/31/2021 0000    LDLDIRECT 107 (H) 05/27/2014 1010    Hepatic Function Latest Ref Rng & Units 01/31/2021 12/22/2019 02/12/2019  Total Protein 6.1 - 8.1 g/dL 7.6 7.8 7.3  Albumin 3.6 - 5.1 g/dL - - -  AST 10 - 35 U/L 20 21 20   ALT 6 - 29 U/L 16 17 17   Alk Phosphatase 33 - 130 U/L - - -  Total Bilirubin 0.2 - 1.2 mg/dL 0.9 0.9 0.5    Lab Results  Component Value Date/Time   TSH 2.18 03/29/2020 08:44 AM   TSH 1.940 03/04/2012 10:30 AM    CBC Latest Ref Rng & Units 01/31/2021 03/29/2020 02/11/2016  WBC 3.8 - 10.8 Thousand/uL 10.5 10.9(H) 11.8(H)  Hemoglobin 11.7 - 15.5 g/dL 14.6 14.2 13.6  Hematocrit 35.0 - 45.0 % 43.8 42.1 40.3  Platelets 140 - 400 Thousand/uL 259 293 353    No results found for: VD25OH  Clinical ASCVD: No  The 10-year ASCVD risk score (Arnett DK, et al., 2019) is: 17.4%   Values used to calculate the score:     Age: 75 95ars     Sex: Female     Is Non-Hispanic African American: No     Diabetic: Yes     Tobacco smoker: No     Systolic Blood Pressure: 104157Hg     Is BP treated: No     HDL Cholesterol: 65 mg/dL     Total Cholesterol: 140 mg/dL     Social History   Tobacco Use  Smoking Status Never  Smokeless Tobacco Never   BP Readings from Last 3 Encounters:  08/01/21 104/66  05/02/21 (!) 123/47  01/31/21 109/61   Pulse Readings from Last 3 Encounters:  08/01/21 85  05/02/21 77  01/31/21 72   Wt Readings from Last 3 Encounters:  08/01/21 155 lb (70.3 kg)  05/02/21 153 lb (69.4 kg)  01/31/21 155 lb (70.3 kg)    Assessment: Review of patient past medical history, allergies, medications, health status, including review of consultants reports, laboratory and other test data, was performed as part of comprehensive evaluation and provision of chronic care management services.   SDOH:  (Social Determinants of Health) assessments and interventions performed:    CCM Care Plan  Allergies  Allergen Reactions   Ibuprofen Rash   Iodinated Diagnostic Agents Rash     IV Contrast   Sulfa Antibiotics Rash   Atorvastatin Other (See Comments)    HA   Naproxen Sodium    Pravastatin Other (See Comments)    dizziness   Simvastatin Other (See Comments)    Nightmares   Sulfonamide Derivatives     REACTION: rash   Trazodone And Nefazodone Other (See Comments)    Nightmares    Xigduo Xr [Dapagliflozin-Metformin Hcl Er] Other (See Comments)    Excessive fatigue. Tolerates metformin alone.    Medications Reviewed Today     Reviewed by Hali Marry, MD (Physician) on 08/01/21 at 1307  Med List Status: <None>   Medication Order Taking? Sig Documenting Provider Last Dose Status Informant  Acetaminophen (TYLENOL 8 HOUR ARTHRITIS PAIN PO) [id number] Yes Take 1 tablet by mouth at bedtime as needed. [provider] Taking Active Self  blood glucose meter kit and supplies [id number] Yes Dispense based on patient and insurance preference. Use up to four times daily as directed. Dx:E11.9 Hali Marry, MD Taking Active   glucose blood test strip [id number] Yes TO CHECK GLUCOSE UP TO 4 TIMES DAILY AS DIRECTED. Hali Marry, MD Taking Active   LEVEMIR FLEXTOUCH 100 UNIT/ML FlexPen [id number] Yes INJECT 10 TO 20 UNITS SUBCUTANEOUSLY AT BEDTIME  Patient taking differently: 14 Units.   Hali Marry, MD Taking Active            Med Note [name] May 09, 2021  2:36 PM) Taking 10 units daily as of 05/09/21 review  lisinopril (ZESTRIL) 2.5 MG tablet [id number] Yes Take 1 tablet by mouth once daily Hali Marry, MD Taking Active   melatonin 5 MG TABS [id number] Yes Take 5 mg by mouth at bedtime. [provider] Taking Active   metFORMIN (GLUCOPHAGE-XR) 500 MG 24 hr tablet [id number] Yes TAKE 2 TABLETS BY MOUTH ONCE DAILY WITH BREAKFAST Hali Marry, MD Taking Active   rosuvastatin (CRESTOR) 20 MG tablet [id number] Yes TAKE 1 TABLET BY MOUTH AT BEDTIME Hali Marry, MD Taking Active    Semaglutide,0.25 or 0.5MG/DOS, (OZEMPIC, 0.25 OR 0.5 MG/DOSE,) 2 MG/1.5ML SOPN [id number] Yes Inject into the skin. [provider] Taking Active             Patient Active Problem List   Diagnosis Date Noted   Seasonal allergies 03/29/2020   History of iron deficiency 03/29/2020   Hyperlipidemia associated with type 2 diabetes mellitus ([place]) 03/13/2016   Obesity (BMI 30-39.9) 11/19/2013   History of splenectomy 01/06/2013   Oral lesion - Fibroma/Reactive Changes to tounge 08/07/2012   Pancreatic cyst 07/12/2011   Diabetes mellitus, type II ([place]) 05/17/2007   KIDNEY  DISEASE, CHRONIC, STAGE III 05/17/2007   ABNORMAL SERUM ENZYME LEVEL NEC 05/16/2007   CARRIER, VIRAL HEPATITIS C 05/16/2007   SCOLIOSIS 06/26/2006    Immunization History  Administered Date(s) Administered   Fluad Quad(high Dose 65+) 06/23/2019, 08/01/2021   H1N1 07/17/2008   Influenza Split 06/10/2012   Influenza Whole 08/27/2007, 06/09/2008, 06/18/2009, 07/13/2009, 06/20/2010   Influenza, High Dose Seasonal PF 06/12/2017, 06/19/2018, 07/02/2020   Influenza,inj,Quad PF,6+ Mos 05/12/2013, 05/21/2014, 06/03/2015, 06/15/2016   Moderna Sars-Covid-2 Vaccination 12/26/2019, 01/17/2020, 08/16/2020, 03/15/2021   Pneumococcal Conjugate-13 12/02/2014   Pneumococcal Polysaccharide-23 08/27/2007, 11/20/2011   Td 06/19/2005   Tdap 01/25/2017   Zoster, Live 07/20/2010    Conditions to be addressed/monitored: HLD and DMII  There are no care plans that you recently modified to display for this patient.     Medication Assistance: Application for ozempic  medication assistance program. in process.  Anticipated assistance start date TBD.  See plan of care for additional detail.  Patient's preferred pharmacy is:  [facility] [address] [place] [address] Phone: [phone] Fax: [phone]  Uses pill box? Yes Pt endorses 100%  compliance  Follow Up:  Patient agrees to Care Plan and Follow-up.  Plan: Telephone follow up appointment with care management team member scheduled for:  2 -3 weeks  Larinda Buttery, PharmD Clinical Pharmacist [facility] [phone]

## 2021-08-08 NOTE — Telephone Encounter (Signed)
Unsuccessful attempt x2 to contact patient for follow-up phone call for CCM services with pharmacist.  Will route to schedule team for reschedule.  Blimie Vaness, PharmD Clinical Pharmacist [facility] [phone]   

## 2021-08-09 NOTE — Chronic Care Management (AMB) (Signed)
  Care Management   Note  08/09/2021 Name: [name] MRN: [id number] DOB: [birth year]  Hannah Rojas is a 75 y.o. year old female who is a primary care patient of Hali Marry, MD and is actively engaged with the care management team. I reached out to Hannah Rojas by phone today to assist with re-scheduling a follow up visit with the Pharmacist  Follow up plan: Telephone appointment with care management team member scheduled for: 08/15/2021  Julian Hy, [place], [place] Management  Direct Dial: [phone]

## 2021-08-15 ENCOUNTER — Ambulatory Visit (INDEPENDENT_AMBULATORY_CARE_PROVIDER_SITE_OTHER): Payer: Medicare Other | Admitting: Pharmacist

## 2021-08-15 ENCOUNTER — Other Ambulatory Visit: Payer: Self-pay

## 2021-08-15 DIAGNOSIS — E1169 Type 2 diabetes mellitus with other specified complication: Secondary | ICD-10-CM

## 2021-08-15 DIAGNOSIS — E119 Type 2 diabetes mellitus without complications: Secondary | ICD-10-CM

## 2021-08-15 DIAGNOSIS — E785 Hyperlipidemia, unspecified: Secondary | ICD-10-CM

## 2021-08-15 NOTE — Progress Notes (Signed)
Chronic Care Management Pharmacy Note  08/16/2021 Name:  Hannah Rojas MRN:  [id number] DOB:  [birth year]  Summary: addressed HLD and primarily DM. Patient is on metformin + levemir 10 units nightly, and titrating ozempic. She increased to ozempic 0.21m on 08/08/21 and doing well with no side effects! Her 2023 patient assistance application was completed and faxed 08/10/21.   BG values:  141 on 08/15/21 (Attributes to eating lemon muffin late last night)  Last several BG: 112, 139, 127, 138, 128  Recommendations/Changes made from today's visit: Continue ozempic with goal of stopping insulin. Due to increase to 1 mg weekly (using sample pen until patient assistance arrives) on 09/05/21.  Plan: f/u with pharmacist in 2-3 weeks  Subjective: Hannah PITTSLEYis an 75y.o. year old female who is a primary patient of [name], CRene Kocher MD.  The CCM team was consulted for assistance with disease management and care coordination needs.    Engaged with patient by telephone for follow up visit in response to provider referral for pharmacy case management and/or care coordination services.   Consent to Services:  The patient was given information about Chronic Care Management services, agreed to services, and gave verbal consent prior to initiation of services.  Please see initial visit note for detailed documentation.   Patient Care Team: MHali Marry MD as PCP - General KDarius Bump [facility] Pharmac[facility] (Pharmacist)   Objective:  Lab Results  Component Value Date   CREATININE 1.10 (H) 08/01/2021   CREATININE 1.04 (H) 01/31/2021   CREATININE 1.00 (H) 08/02/2020    Lab Results  Component Value Date   HGBA1C 8.3 (A) 08/01/2021   Last diabetic Eye exam:  Lab Results  Component Value Date/Time   HMDIABEYEEXA No Retinopathy 12/01/2019 12:00 AM    Last diabetic Foot exam: No results found for: HMDIABFOOTEX      Component Value Date/Time   CHOL 140 01/31/2021  0000   TRIG 133 01/31/2021 0000   HDL 65 01/31/2021 0000   CHOLHDL 2.2 01/31/2021 0000   VLDL 19 03/16/2016 0925   LDLCALC 54 01/31/2021 0000   LDLDIRECT 107 (H) 05/27/2014 1010    Hepatic Function Latest Ref Rng & Units 01/31/2021 12/22/2019 02/12/2019  Total Protein 6.1 - 8.1 g/dL 7.6 7.8 7.3  Albumin 3.6 - 5.1 g/dL - - -  AST 10 - 35 U/L 20 21 20   ALT 6 - 29 U/L 16 17 17   Alk Phosphatase 33 - 130 U/L - - -  Total Bilirubin 0.2 - 1.2 mg/dL 0.9 0.9 0.5    Lab Results  Component Value Date/Time   TSH 2.18 03/29/2020 08:44 AM   TSH 1.940 03/04/2012 10:30 AM    CBC Latest Ref Rng & Units 01/31/2021 03/29/2020 02/11/2016  WBC 3.8 - 10.8 Thousand/uL 10.5 10.9(H) 11.8(H)  Hemoglobin 11.7 - 15.5 g/dL 14.6 14.2 13.6  Hematocrit 35.0 - 45.0 % 43.8 42.1 40.3  Platelets 140 - 400 Thousand/uL 259 293 353    No results found for: VD25OH  Clinical ASCVD: No  The 10-year ASCVD risk score (Arnett DK, et al., 2019) is: 17.4%   Values used to calculate the score:     Age: 75years     Sex: Female     Is Non-Hispanic African American: No     Diabetic: Yes     Tobacco smoker: No     Systolic Blood Pressure: 1119mmHg     Is BP treated: No  HDL Cholesterol: 65 mg/dL     Total Cholesterol: 140 mg/dL     Social History   Tobacco Use  Smoking Status Never  Smokeless Tobacco Never   BP Readings from Last 3 Encounters:  08/01/21 104/66  05/02/21 (!) 123/47  01/31/21 109/61   Pulse Readings from Last 3 Encounters:  08/01/21 85  05/02/21 77  01/31/21 72   Wt Readings from Last 3 Encounters:  08/01/21 155 lb (70.3 kg)  05/02/21 153 lb (69.4 kg)  01/31/21 155 lb (70.3 kg)    Assessment: Review of patient past medical history, allergies, medications, health status, including review of consultants reports, laboratory and other test data, was performed as part of comprehensive evaluation and provision of chronic care management services.   SDOH:  (Social Determinants of Health)  assessments and interventions performed:    CCM Care Plan  Allergies  Allergen Reactions   Ibuprofen Rash   Iodinated Diagnostic Agents Rash    IV Contrast   Sulfa Antibiotics Rash   Atorvastatin Other (See Comments)    HA   Naproxen Sodium    Pravastatin Other (See Comments)    dizziness   Simvastatin Other (See Comments)    Nightmares   Sulfonamide Derivatives     REACTION: rash   Trazodone And Nefazodone Other (See Comments)    Nightmares    Xigduo Xr [Dapagliflozin-Metformin Hcl Er] Other (See Comments)    Excessive fatigue. Tolerates metformin alone.    Medications Reviewed Today     Reviewed by Darius Bump, [facility] (Pharmacist) on 08/15/21 at 1530  Med List Status: <None>   Medication Order Taking? Sig Documenting Provider Last Dose Status Informant  Acetaminophen (TYLENOL 8 HOUR ARTHRITIS PAIN PO) [id number] Yes Take 1 tablet by mouth at bedtime as needed. [provider] Taking Active Self  blood glucose meter kit and supplies [id number] Yes Dispense based on patient and insurance preference. Use up to four times daily as directed. Dx:E11.9 Hali Marry, MD Taking Active   glucose blood test strip [id number] Yes TO CHECK GLUCOSE UP TO 4 TIMES DAILY AS DIRECTED. Hali Marry, MD Taking Active   insulin detemir (LEVEMIR FLEXTOUCH) 100 UNIT/ML FlexPen [id number] Yes Inject 10-20 Units into the skin at bedtime. INJECT 10 TO 20 UNITS SUBCUTANEOUSLY AT BEDTIME Strength: 100 UNIT/ML Hali Marry, MD Taking Active   lisinopril (ZESTRIL) 2.5 MG tablet [id number] Yes Take 1 tablet by mouth once daily Hali Marry, MD Taking Active   melatonin 5 MG TABS [id number] Yes Take 5 mg by mouth at bedtime. [provider] Taking Active   metFORMIN (GLUCOPHAGE-XR) 500 MG 24 hr tablet [id number] Yes TAKE 2 TABLETS BY MOUTH ONCE DAILY WITH BREAKFAST Hali Marry, MD Taking Active   rosuvastatin (CRESTOR) 20 MG tablet [id number] Yes TAKE  1 TABLET BY MOUTH AT BEDTIME Hali Marry, MD Taking Active   Semaglutide,0.25 or 0.5MG/DOS, (OZEMPIC, 0.25 OR 0.5 MG/DOSE,) 2 MG/1.5ML SOPN [id number] Yes Inject into the skin. [provider] Taking Active             Patient Active Problem List   Diagnosis Date Noted   Seasonal allergies 03/29/2020   History of iron deficiency 03/29/2020   Hyperlipidemia associated with type 2 diabetes mellitus ([place]) 03/13/2016   Obesity (BMI 30-39.9) 11/19/2013   History of splenectomy 01/06/2013   Oral lesion - Fibroma/Reactive Changes to tounge 08/07/2012   Pancreatic cyst 07/12/2011   Diabetes mellitus, type II ([place]) 05/17/2007   KIDNEY  DISEASE, CHRONIC, STAGE III 05/17/2007   ABNORMAL SERUM ENZYME LEVEL NEC 05/16/2007   CARRIER, VIRAL HEPATITIS C 05/16/2007   SCOLIOSIS 06/26/2006    Immunization History  Administered Date(s) Administered   Fluad Quad(high Dose 65+) 06/23/2019, 08/01/2021   H1N1 07/17/2008   Influenza Split 06/10/2012   Influenza Whole 08/27/2007, 06/09/2008, 06/18/2009, 07/13/2009, 06/20/2010   Influenza, High Dose Seasonal PF 06/12/2017, 06/19/2018, 07/02/2020   Influenza,inj,Quad PF,6+ Mos 05/12/2013, 05/21/2014, 06/03/2015, 06/15/2016   Moderna Sars-Covid-2 Vaccination 12/26/2019, 01/17/2020, 08/16/2020, 03/15/2021   Pneumococcal Conjugate-13 12/02/2014   Pneumococcal Polysaccharide-23 08/27/2007, 11/20/2011   Td 06/19/2005   Tdap 01/25/2017   Zoster, Live 07/20/2010    Conditions to be addressed/monitored: HLD and DMII  Care Plan : Medication Management  Updates made by Darius Bump, [place] since 08/16/2021 12:00 AM     Problem: DM, HLD      Long-Range Goal: Disease Progression Prevention   Start Date: 03/07/2021  Recent Progress: On track  Priority: High  Note:   Current Barriers:  Unable to independently afford treatment regimen   Pharmacist Clinical Goal(s):  Over the next 14 days, patient will adhere to plan to optimize  therapeutic regimen for diabetes as evidenced by report of adherence to recommended medication management changes through collaboration with PharmD and provider.   Interventions: 1:1 collaboration with Hali Marry, MD regarding development and update of comprehensive plan of care as evidenced by provider attestation and co-signature Inter-disciplinary care team collaboration (see longitudinal plan of care) Comprehensive medication review performed; medication list updated in electronic medical record  Diabetes:  Uncontrolled; current treatment:Levemir 10 units nightly, metformin 534m BID, ozempic 0.5102mweekly (doing well, no side effects)  Current glucose readings: 130s fasting glucose. Not currently checking postprandial glucose.  Denies hypoglycemic symptoms   Previously discussed current meal patterns: breakfast: banana; lunch: subway or salad; dinner: snacks on small things or frozen dinner ; snacks: chips, or cheezy's, handful at most; drinks: diet coke zero, water   Current exercise: walking, works full time at [employer] educated on benefits of low-dose ACEI for renal protection & statin for protection against cardiovascular & cerebrovascular events  Recommend continue ozempic 0.56m60meekly with goal of stopping insulin. Due to increase to 1mg24mekly on 09/05/21. Hyperlipidemia:  Controlled; current treatment:rosuvastatin 20mg31mL 54  Recommended continue current regimen  Patient Goals/Self-Care Activities Over the next 14 days, patient will:  take medications as prescribed and collaborate with provider on medication access solutions  Follow Up Plan: Telephone follow up appointment with care management team member scheduled for: 2-3 weeks        Medication Assistance: Application for ozempic  medication assistance program. in process.  Anticipated assistance start date TBD.  See plan of care for additional detail.  [facility] [address] [id number]: [phone] pill box? Yes Pt endorses 100% compliance  Follow Up:  Patient agrees to Care Plan and Follow-up.  Plan: Telephone follow up appointment with care management team member scheduled for:  2 -3 weeks  KeeshLarinda ButteryrmD Clinical Pharmacist [phone]

## 2021-08-16 NOTE — Patient Instructions (Signed)
Visit Information  Thank you for taking time to visit with me today. Please don't hesitate to contact me if I can be of assistance to you before our next scheduled telephone appointment.  Following are the goals we discussed today:  Patient Goals/Self-Care Activities Over the next 14 days, patient will:  take medications as prescribed and collaborate with provider on medication access solutions  Follow Up Plan: Telephone follow up appointment with care management team member scheduled for:   2-3 weeks  Please call the care guide team at [phone] if you need to cancel or reschedule your appointment.    Patient verbalizes understanding of instructions provided today and agrees to view in [place].   Hannah Rojas

## 2021-08-17 DIAGNOSIS — E119 Type 2 diabetes mellitus without complications: Secondary | ICD-10-CM

## 2021-08-17 DIAGNOSIS — E1169 Type 2 diabetes mellitus with other specified complication: Secondary | ICD-10-CM | POA: Diagnosis not present

## 2021-08-17 DIAGNOSIS — E785 Hyperlipidemia, unspecified: Secondary | ICD-10-CM | POA: Diagnosis not present

## 2021-08-23 ENCOUNTER — Other Ambulatory Visit: Payer: Self-pay | Admitting: Family Medicine

## 2021-08-23 DIAGNOSIS — E119 Type 2 diabetes mellitus without complications: Secondary | ICD-10-CM

## 2021-09-14 ENCOUNTER — Ambulatory Visit (INDEPENDENT_AMBULATORY_CARE_PROVIDER_SITE_OTHER): Payer: Medicare Other | Admitting: Pharmacist

## 2021-09-14 NOTE — Progress Notes (Signed)
Chronic Care Management Pharmacy Note  09/14/2021 Name:  Hannah Rojas MRN:  [id number] DOB:  [birth year]  Summary: addressed HLD and primarily DM. Patient is on metformin + levemir, 8-10 units nightly, and titrating ozempic. She remains on ozempic 0.$RemoveBefo'5mg'ioyjbBdpFkd$  weekly, tolerating well. Her 2023 patient assistance application was completed and faxed 08/10/21, we are still awaiting response from company.  Patient has been off of insulin 2-3 days because she ran out of needles and still needs to pick up from pharmacy.  BG values:  09/07/21 121, 122, 135, 129, 126, 128, 156, 147 (ran out of needles for her insulin)  Recommendations/Changes made from today's visit: Continue ozempic with goal of stopping insulin. Advised her to continue insulin in the meantime. Will increase to $RemoveBef'1mg'EgxLqeUahR$  weekly dosing of ozempic as soon as we hear from company regarding status of patient assistance.  Plan: f/u with pharmacist in 2-3 weeks, after concierge has update from company.  Subjective: Hannah Rojas is an 75 y.o. year old female who is a primary patient of [name], Rene Kocher, MD.  The CCM team was consulted for assistance with disease management and care coordination needs.    Engaged with patient by telephone for follow up visit in response to provider referral for pharmacy case management and/or care coordination services.   Consent to Services:  The patient was given information about Chronic Care Management services, agreed to services, and gave verbal consent prior to initiation of services.  Please see initial visit note for detailed documentation.   Patient Care Team: Hali Marry, MD as PCP - General Darius Bump, [facility] as Pharmacist (Pharmacist)   Objective:  Lab Results  Component Value Date   CREATININE 1.10 (H) 08/01/2021   CREATININE 1.04 (H) 01/31/2021   CREATININE 1.00 (H) 08/02/2020    Lab Results  Component Value Date   HGBA1C 8.3 (A) 08/01/2021   Last diabetic  Eye exam:  Lab Results  Component Value Date/Time   HMDIABEYEEXA No Retinopathy 12/01/2019 12:00 AM    Last diabetic Foot exam: No results found for: HMDIABFOOTEX      Component Value Date/Time   CHOL 140 01/31/2021 0000   TRIG 133 01/31/2021 0000   HDL 65 01/31/2021 0000   CHOLHDL 2.2 01/31/2021 0000   VLDL 19 03/16/2016 0925   LDLCALC 54 01/31/2021 0000   LDLDIRECT 107 (H) 05/27/2014 1010    Hepatic Function Latest Ref Rng & Units 01/31/2021 12/22/2019 02/12/2019  Total Protein 6.1 - 8.1 g/dL 7.6 7.8 7.3  Albumin 3.6 - 5.1 g/dL - - -  AST 10 - 35 U/L $Remo'20 21 20  'DpALJ$ ALT 6 - 29 U/L $Remo'16 17 17  'jvWXg$ Alk Phosphatase 33 - 130 U/L - - -  Total Bilirubin 0.2 - 1.2 mg/dL 0.9 0.9 0.5    Lab Results  Component Value Date/Time   TSH 2.18 03/29/2020 08:44 AM   TSH 1.940 03/04/2012 10:30 AM    CBC Latest Ref Rng & Units 01/31/2021 03/29/2020 02/11/2016  WBC 3.8 - 10.8 Thousand/uL 10.5 10.9(H) 11.8(H)  Hemoglobin 11.7 - 15.5 g/dL 14.6 14.2 13.6  Hematocrit 35.0 - 45.0 % 43.8 42.1 40.3  Platelets 140 - 400 Thousand/uL 259 293 353    No results found for: VD25OH  Clinical ASCVD: No  The 10-year ASCVD risk score (Arnett DK, et al., 2019) is: 19.7%   Values used to calculate the score:     Age: 21 years     Sex: Female     Is Non-Hispanic  African American: No     Diabetic: Yes     Tobacco smoker: No     Systolic Blood Pressure: 785 mmHg     Is BP treated: No     HDL Cholesterol: 65 mg/dL     Total Cholesterol: 140 mg/dL     Social History   Tobacco Use  Smoking Status Never  Smokeless Tobacco Never   BP Readings from Last 3 Encounters:  08/01/21 104/66  05/02/21 (!) 123/47  01/31/21 109/61   Pulse Readings from Last 3 Encounters:  08/01/21 85  05/02/21 77  01/31/21 72   Wt Readings from Last 3 Encounters:  08/01/21 155 lb (70.3 kg)  05/02/21 153 lb (69.4 kg)  01/31/21 155 lb (70.3 kg)    Assessment: Review of patient past medical history, allergies, medications, health  status, including review of consultants reports, laboratory and other test data, was performed as part of comprehensive evaluation and provision of chronic care management services.   SDOH:  (Social Determinants of Health) assessments and interventions performed:    CCM Care Plan  Allergies  Allergen Reactions   Ibuprofen Rash   Iodinated Contrast Media Rash    IV Contrast   Sulfa Antibiotics Rash   Atorvastatin Other (See Comments)    HA   Naproxen Sodium    Pravastatin Other (See Comments)    dizziness   Simvastatin Other (See Comments)    Nightmares   Sulfonamide Derivatives     REACTION: rash   Trazodone And Nefazodone Other (See Comments)    Nightmares    Xigduo Xr [Dapagliflozin-Metformin Hcl Er] Other (See Comments)    Excessive fatigue. Tolerates metformin alone.    Medications Reviewed Today     Reviewed by Darius Bump, [facility] (Pharmacist) on 08/15/21 at 1530  Med List Status: <None>   Medication Order Taking? Sig Documenting Provider Last Dose Status Informant  Acetaminophen (TYLENOL 8 HOUR ARTHRITIS PAIN PO) [id number] Yes Take 1 tablet by mouth at bedtime as needed. [provider] Taking Active Self  blood glucose meter kit and supplies [id number] Yes Dispense based on patient and insurance preference. Use up to four times daily as directed. Dx:E11.9 Hali Marry, MD Taking Active   glucose blood test strip [id number] Yes TO CHECK GLUCOSE UP TO 4 TIMES DAILY AS DIRECTED. Hali Marry, MD Taking Active   insulin detemir (LEVEMIR FLEXTOUCH) 100 UNIT/ML FlexPen [id number] Yes Inject 10-20 Units into the skin at bedtime. INJECT 10 TO 20 UNITS SUBCUTANEOUSLY AT BEDTIME Strength: 100 UNIT/ML Hali Marry, MD Taking Active   lisinopril (ZESTRIL) 2.5 MG tablet [id number] Yes Take 1 tablet by mouth once daily Hali Marry, MD Taking Active   melatonin 5 MG TABS [id number] Yes Take 5 mg by mouth at bedtime. [provider]  Taking Active   metFORMIN (GLUCOPHAGE-XR) 500 MG 24 hr tablet [id number] Yes TAKE 2 TABLETS BY MOUTH ONCE DAILY WITH BREAKFAST Hali Marry, MD Taking Active   rosuvastatin (CRESTOR) 20 MG tablet [id number] Yes TAKE 1 TABLET BY MOUTH AT BEDTIME Hali Marry, MD Taking Active   Semaglutide,0.25 or 0.5MG /DOS, (OZEMPIC, 0.25 OR 0.5 MG/DOSE,) 2 MG/1.5ML SOPN [id number] Yes Inject into the skin. [provider] Taking Active             Patient Active Problem List   Diagnosis Date Noted   Seasonal allergies 03/29/2020   History of iron deficiency 03/29/2020   Hyperlipidemia associated with type 2 diabetes mellitus ([place]) 03/13/2016  Obesity (BMI 30-39.9) 11/19/2013   History of splenectomy 01/06/2013   Oral lesion - Fibroma/Reactive Changes to tounge 08/07/2012   Pancreatic cyst 07/12/2011   Diabetes mellitus, type II ([place]) 05/17/2007   KIDNEY DISEASE, CHRONIC, STAGE III 05/17/2007   ABNORMAL SERUM ENZYME LEVEL NEC 05/16/2007   CARRIER, VIRAL HEPATITIS C 05/16/2007   SCOLIOSIS 06/26/2006    Immunization History  Administered Date(s) Administered   Fluad Quad(high Dose 65+) 06/23/2019, 08/01/2021   H1N1 07/17/2008   Influenza Split 06/10/2012   Influenza Whole 08/27/2007, 06/09/2008, 06/18/2009, 07/13/2009, 06/20/2010   Influenza, High Dose Seasonal PF 06/12/2017, 06/19/2018, 07/02/2020   Influenza,inj,Quad PF,6+ Mos 05/12/2013, 05/21/2014, 06/03/2015, 06/15/2016   Moderna Sars-Covid-2 Vaccination 12/26/2019, 01/17/2020, 08/16/2020, 03/15/2021   Pneumococcal Conjugate-13 12/02/2014   Pneumococcal Polysaccharide-23 08/27/2007, 11/20/2011   Td 06/19/2005   Tdap 01/25/2017   Zoster, Live 07/20/2010    Conditions to be addressed/monitored: HLD and DMII  Care Plan : Medication Management  Updates made by Darius Bump, [place] since 09/14/2021 12:00 AM     Problem: DM, HLD      Long-Range Goal: Disease Progression Prevention   Start Date: 03/07/2021   Recent Progress: On track  Priority: High  Note:   Current Barriers:  Unable to independently afford treatment regimen   Pharmacist Clinical Goal(s):  Over the next 14 days, patient will adhere to plan to optimize therapeutic regimen for diabetes as evidenced by report of adherence to recommended medication management changes through collaboration with PharmD and provider.   Interventions: 1:1 collaboration with Hali Marry, MD regarding development and update of comprehensive plan of care as evidenced by provider attestation and co-signature Inter-disciplinary care team collaboration (see longitudinal plan of care) Comprehensive medication review performed; medication list updated in electronic medical record  Diabetes:  Uncontrolled; current treatment:Levemir 10 units nightly, metformin 500mg  BID, ozempic 0.5mg  weekly (doing well, no side effects)  Current glucose readings: 130s fasting glucose. Not currently checking postprandial glucose.  Denies hypoglycemic symptoms   Previously discussed current meal patterns: breakfast: banana; lunch: subway or salad; dinner: snacks on small things or frozen dinner ; snacks: chips, or cheezy's, handful at most; drinks: diet coke zero, water   Current exercise: walking, works full time at [employer]  Previously educated on benefits of low-dose ACEI for renal protection & statin for protection against cardiovascular & cerebrovascular events  Recommend continue ozempic 0.5mg  weekly with goal of stopping insulin. Due to increase to 1mg  weekly as soon as we can get update from [employer] for drug supply. Hyperlipidemia:  Controlled; current treatment:rosuvastatin 20mg ; LDL 54  Recommended continue current regimen  Patient Goals/Self-Care Activities Over the next 14 days, patient will:  take medications as prescribed and collaborate with provider on medication access solutions  Follow Up Plan: Telephone follow up appointment with care  management team member scheduled for: 2-3 weeks         Medication Assistance: Application for ozempic  medication assistance program. in process.  Anticipated assistance start date TBD.  See plan of care for additional detail.  Patient's preferred pharmacy is:  [facility] [address], [place] [address] Phone: [phone] Fax: [phone]   Uses pill box? Yes Pt endorses 100% compliance  Follow Up:  Patient agrees to Care Plan and Follow-up.  Plan: Telephone follow up appointment with care management team member scheduled for:  2 -3 weeks  Larinda Buttery, PharmD Clinical Pharmacist [facility]  [phone]

## 2021-09-14 NOTE — Patient Instructions (Signed)
Visit Information  Thank you for taking time to visit with me today. Please don't hesitate to contact me if I can be of assistance to you before our next scheduled telephone appointment.  Following are the goals we discussed today:   Patient Goals/Self-Care Activities Over the next 14 days, patient will:  take medications as prescribed and collaborate with provider on medication access solutions  Follow Up Plan: Telephone follow up appointment with care management team member scheduled for: 2-3 weeks  Please call the care guide team at [phone] if you need to cancel or reschedule your appointment.   Patient verbalizes understanding of instructions provided today and agrees to view in [place].   Darius Bump

## 2021-09-17 DIAGNOSIS — E785 Hyperlipidemia, unspecified: Secondary | ICD-10-CM | POA: Diagnosis not present

## 2021-09-17 DIAGNOSIS — E119 Type 2 diabetes mellitus without complications: Secondary | ICD-10-CM

## 2021-09-17 DIAGNOSIS — E1169 Type 2 diabetes mellitus with other specified complication: Secondary | ICD-10-CM

## 2021-09-20 ENCOUNTER — Telehealth: Payer: Self-pay | Admitting: Pharmacist

## 2021-09-20 NOTE — Progress Notes (Signed)
I have called [employer] to check on the status of the Ozempic patient assistance application. The automated system states that the patients information could not be found usually it indicates that they didn't receive that application for that patient. I would recommend to resend the application if possible.  Corrie Mckusick, [place]

## 2021-10-05 ENCOUNTER — Other Ambulatory Visit: Payer: Self-pay

## 2021-10-05 DIAGNOSIS — E119 Type 2 diabetes mellitus without complications: Secondary | ICD-10-CM

## 2021-10-05 MED ORDER — LEVEMIR FLEXTOUCH 100 UNIT/ML ~~LOC~~ SOPN: 10.0000 [IU] | PEN_INJECTOR | Freq: Every day | SUBCUTANEOUS | 3 refills | Status: DC

## 2021-10-27 ENCOUNTER — Other Ambulatory Visit: Payer: Self-pay | Admitting: Family Medicine

## 2021-10-31 ENCOUNTER — Ambulatory Visit (INDEPENDENT_AMBULATORY_CARE_PROVIDER_SITE_OTHER): Payer: Medicare Other | Admitting: Family Medicine

## 2021-10-31 ENCOUNTER — Encounter: Payer: Self-pay | Admitting: Family Medicine

## 2021-10-31 ENCOUNTER — Other Ambulatory Visit: Payer: Self-pay

## 2021-10-31 ENCOUNTER — Telehealth: Payer: Self-pay | Admitting: Family Medicine

## 2021-10-31 VITALS — BP 117/68 | HR 84 | Wt 152.0 lb

## 2021-10-31 DIAGNOSIS — E119 Type 2 diabetes mellitus without complications: Secondary | ICD-10-CM

## 2021-10-31 DIAGNOSIS — F4329 Adjustment disorder with other symptoms: Secondary | ICD-10-CM | POA: Insufficient documentation

## 2021-10-31 DIAGNOSIS — N1831 Chronic kidney disease, stage 3a: Secondary | ICD-10-CM

## 2021-10-31 LAB — POCT GLYCOSYLATED HEMOGLOBIN (HGB A1C): HbA1c, POC (controlled diabetic range): 7.1 % — AB (ref 0.0–7.0)

## 2021-10-31 LAB — POCT UA - MICROALBUMIN
Albumin/Creatinine Ratio, Urine, POC: ABNORMAL
Creatinine, POC: 300 mg/dL
Microalbumin Ur, POC: 80 mg/L

## 2021-10-31 MED ORDER — OZEMPIC (1 MG/DOSE) 4 MG/3ML ~~LOC~~ SOPN: 1.0000 mg | PEN_INJECTOR | SUBCUTANEOUS | 0 refills | Status: DC

## 2021-10-31 MED ORDER — ESCITALOPRAM OXALATE 5 MG PO TABS: 5.0000 mg | ORAL_TABLET | Freq: Every day | ORAL | 0 refills | Status: DC

## 2021-10-31 NOTE — Assessment & Plan Note (Signed)
Well controlled. Continue current regimen. Follow up in  3-4 months.  

## 2021-10-31 NOTE — Assessment & Plan Note (Signed)
Following every 6 months.

## 2021-10-31 NOTE — Progress Notes (Signed)
Established Patient Office Visit  Subjective:  Patient ID: Hannah Rojas, female    DOB: 08-30-1946  Age: 76 y.o. MRN: [id number]  CC:  Chief Complaint  Patient presents with   Diabetes    HPI Hannah Rojas presents for   Diabetes - no hypoglycemic events. No wounds or sores that are not healing well. No increased thirst or urination. Checking glucose at home.  He says her blood sugars are really up-and-down.  That she has been tolerating the 0.5 mg Ozempic well.  Taking medications as prescribed without any side effects.  She also reports that she has been dealing with some increase stress at work and also taking great care of her 110-year-old granddaughter.  She wonders if there is something that she could take that might just help take that edge off a little bit.  Past Medical History:  Diagnosis Date   Cataracts, bilateral    bilateral   H. pylori infection    history   Pancreatic cyst    hx mucinous cystoadenoma    Past Surgical History:  Procedure Laterality Date   CHOLECYSTECTOMY     ESOPHAGOGASTRODUODENOSCOPY     PANCREATIC CYST EXCISION     spleenectomy      No family history on file.  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 She takes care of grandchildren through out the week. She likes to go for walks in her free time.   Social Determinants of Health   Financial Resource Strain: Low Risk    Difficulty of Paying Living Expenses: Not hard at all  Food  Insecurity: No Food Insecurity   Worried About Charity fundraiser in the Last Year: Never true   [place] in the Last Year: Never true  Transportation Needs: No Transportation Needs   Lack of Transportation (Medical): No   Lack of Transportation (Non-Medical): No  Physical Activity: Insufficiently Active   Days of Exercise per Week: 1 day   Minutes of Exercise per Session: 20 min  Stress: No Stress Concern Present   Feeling of Stress : Not at all  Social Connections: Moderately Isolated   Frequency of Communication with Friends and Family: More than three times a week   Frequency of Social Gatherings with Friends and Family: Once a week   Attends Religious Services: More than 4 times per year   Active Member of [employer] or Organizations: No   Attends Archivist Meetings: Never   Marital Status: Widowed  Human resources officer Violence: Not At Risk   Fear of Current or Ex-Partner: No   Emotionally Abused: No   Physically Abused: No   Sexually Abused: No    Outpatient Medications Prior to Visit  Medication Sig Dispense Refill   Acetaminophen (TYLENOL 8 HOUR ARTHRITIS PAIN PO) Take 1 tablet by mouth at bedtime as needed.     blood  glucose meter kit and supplies Dispense based on patient and insurance preference. Use up to four times daily as directed. Dx:E11.9 1 each 0   glucose blood test strip TO CHECK GLUCOSE UP TO 4 TIMES DAILY AS DIRECTED. 300 each 12   insulin detemir (LEVEMIR FLEXTOUCH) 100 UNIT/ML FlexPen Inject 10-20 Units into the skin at bedtime. INJECT 10 TO 20 UNITS SUBCUTANEOUSLY AT BEDTIME Strength: 100 UNIT/ML 15 mL 3   lisinopril (ZESTRIL) 2.5 MG tablet Take 1 tablet by mouth once daily 90 tablet 0   melatonin 5 MG TABS Take 5 mg by mouth at bedtime.     metFORMIN (GLUCOPHAGE-XR) 500 MG 24 hr tablet TAKE 2 TABLETS BY MOUTH ONCE DAILY WITH BREAKFAST 60 tablet 3   rosuvastatin (CRESTOR) 20 MG tablet TAKE 1 TABLET BY MOUTH AT BEDTIME 90 tablet 1    Semaglutide,0.25 or 0.5MG/DOS, (OZEMPIC, 0.25 OR 0.5 MG/DOSE,) 2 MG/1.5ML SOPN Inject 0.5 mg into the skin once a week.     No facility-administered medications prior to visit.    Allergies  Allergen Reactions   Ibuprofen Rash   Iodinated Contrast Media Rash    IV Contrast   Sulfa Antibiotics Rash   Atorvastatin Other (See Comments)    HA   Naproxen Sodium    Pravastatin Other (See Comments)    dizziness   Simvastatin Other (See Comments)    Nightmares   Sulfonamide Derivatives     REACTION: rash   Trazodone And Nefazodone Other (See Comments)    Nightmares    Xigduo Xr [Dapagliflozin-Metformin Hcl Er] Other (See Comments)    Excessive fatigue. Tolerates metformin alone.    ROS Review of Systems    Objective:    Physical Exam Constitutional:      Appearance: Normal appearance. She is well-developed.  HENT:     Head: Normocephalic and atraumatic.  Cardiovascular:     Rate and Rhythm: Normal rate and regular rhythm.     Heart sounds: Normal heart sounds.  Pulmonary:     Effort: Pulmonary effort is normal.     Breath sounds: Normal breath sounds.  Skin:    General: Skin is warm and dry.  Neurological:     Mental Status: She is alert and oriented to person, place, and time.  Psychiatric:        Behavior: Behavior normal.    BP 117/68    Pulse 84    Wt 152 lb (68.9 kg)    BMI 28.72 kg/m  Wt Readings from Last 3 Encounters:  10/31/21 152 lb (68.9 kg)  08/01/21 155 lb (70.3 kg)  05/02/21 153 lb (69.4 kg)     Health Maintenance Due  Topic Date Due   Zoster Vaccines- Shingrix (1 of 2) Never done    There are no preventive care reminders to display for this patient.  Lab Results  Component Value Date   TSH 2.18 03/29/2020   Lab Results  Component Value Date   WBC 10.5 01/31/2021   HGB 14.6 01/31/2021   HCT 43.8 01/31/2021   MCV 91.1 01/31/2021   PLT 259 01/31/2021   Lab Results  Component Value Date   NA 140 08/01/2021   K 5.2 08/01/2021   CO2  28 08/01/2021   GLUCOSE 119 (H) 08/01/2021   BUN 15 08/01/2021   CREATININE 1.10 (H) 08/01/2021   BILITOT 0.9 01/31/2021   ALKPHOS 63 02/07/2017   AST 20 01/31/2021   ALT 16 01/31/2021   PROT 7.6 01/31/2021   ALBUMIN  4.3 02/07/2017   CALCIUM 10.4 08/01/2021   EGFR 53 (L) 08/01/2021   Lab Results  Component Value Date   CHOL 140 01/31/2021   Lab Results  Component Value Date   HDL 65 01/31/2021   Lab Results  Component Value Date   LDLCALC 54 01/31/2021   Lab Results  Component Value Date   TRIG 133 01/31/2021   Lab Results  Component Value Date   CHOLHDL 2.2 01/31/2021   Lab Results  Component Value Date   HGBA1C 7.1 (A) 10/31/2021      Assessment & Plan:   Problem List Items Addressed This Visit       Endocrine   Diabetes mellitus, type II ([place]) - Primary    Well controlled. Continue current regimen. Follow up in  3-4 months.        Relevant Medications   Semaglutide, 1 MG/DOSE, (OZEMPIC, 1 MG/DOSE,) 4 MG/3ML SOPN   Other Relevant Orders   POCT HgB A1C (Completed)   POCT UA - Microalbumin (Completed)     Genitourinary   KIDNEY DISEASE, CHRONIC, STAGE III    Following every 6 months.          Other   Stress and adjustment reaction    Sounds like these are mostly acute stressors.  We discussed just working on D stressing techniques deep breathing, relaxation, and taking time for herself and getting adequate sleep.  Certainly taking a daily medication is an option.  She would like to do a trial.  We will start with 5 mg Lexapro.  We will follow back up in 3 months to see if she is tolerating it well and if she finds it helpful if at any point she has any problems concerns or side effects then please let us know.      Relevant Medications   escitalopram (LEXAPRO) 5 MG tablet    Meds ordered this encounter  Medications   escitalopram (LEXAPRO) 5 MG tablet    Sig: Take 1 tablet (5 mg total) by mouth daily.    Dispense:  90 tablet    Refill:  0    Semaglutide, 1 MG/DOSE, (OZEMPIC, 1 MG/DOSE,) 4 MG/3ML SOPN    Sig: Inject 1 mg into the skin once a week.    Dispense:  12 mL    Refill:  0    Follow-up: Return in about 13 weeks (around 01/30/2022) for Diabetes follow-up.    Beatrice Lecher, MD

## 2021-10-31 NOTE — Assessment & Plan Note (Signed)
Sounds like these are mostly acute stressors.  We discussed just working on D stressing techniques deep breathing, relaxation, and taking time for herself and getting adequate sleep.  Certainly taking a daily medication is an option.  She would like to do a trial.  We will start with 5 mg Lexapro.  We will follow back up in 3 months to see if she is tolerating it well and if she finds it helpful if at any point she has any problems concerns or side effects then please let us know.

## 2021-10-31 NOTE — Telephone Encounter (Signed)
Lysle Morales, just wanted to let you know that I was can increase her Ozempic to 1 mg.  Just printed the prescription and I did not actually send it to the pharmacy I think she is getting patient's assistance.  Was not sure if I should still try to send it through the pharmacy.  Just let me know

## 2021-11-02 ENCOUNTER — Encounter: Payer: Self-pay | Admitting: Family Medicine

## 2021-11-03 NOTE — Progress Notes (Signed)
I have contacted [employer] about update on Ozempic patient assistance application. The representative states they cannot find the patient in the system and seems like they have not received anything for this patient. They recommended to refax the application to [phone].  Hannah Rojas, [place]

## 2021-12-27 ENCOUNTER — Other Ambulatory Visit: Payer: Self-pay | Admitting: Family Medicine

## 2021-12-27 DIAGNOSIS — E119 Type 2 diabetes mellitus without complications: Secondary | ICD-10-CM

## 2022-01-09 DIAGNOSIS — M25571 Pain in right ankle and joints of right foot: Secondary | ICD-10-CM | POA: Diagnosis not present

## 2022-01-09 DIAGNOSIS — N3000 Acute cystitis without hematuria: Secondary | ICD-10-CM | POA: Diagnosis not present

## 2022-01-09 DIAGNOSIS — M19071 Primary osteoarthritis, right ankle and foot: Secondary | ICD-10-CM | POA: Diagnosis not present

## 2022-01-09 DIAGNOSIS — N3001 Acute cystitis with hematuria: Secondary | ICD-10-CM | POA: Diagnosis not present

## 2022-01-09 DIAGNOSIS — R112 Nausea with vomiting, unspecified: Secondary | ICD-10-CM | POA: Diagnosis not present

## 2022-01-09 DIAGNOSIS — M8588 Other specified disorders of bone density and structure, other site: Secondary | ICD-10-CM | POA: Diagnosis not present

## 2022-01-09 DIAGNOSIS — M5136 Other intervertebral disc degeneration, lumbar region: Secondary | ICD-10-CM | POA: Diagnosis not present

## 2022-01-09 DIAGNOSIS — Y999 Unspecified external cause status: Secondary | ICD-10-CM | POA: Diagnosis not present

## 2022-01-09 DIAGNOSIS — M545 Low back pain, unspecified: Secondary | ICD-10-CM | POA: Diagnosis not present

## 2022-01-09 DIAGNOSIS — I4510 Unspecified right bundle-branch block: Secondary | ICD-10-CM | POA: Diagnosis not present

## 2022-01-09 DIAGNOSIS — M7731 Calcaneal spur, right foot: Secondary | ICD-10-CM | POA: Diagnosis not present

## 2022-01-09 DIAGNOSIS — W01198A Fall on same level from slipping, tripping and stumbling with subsequent striking against other object, initial encounter: Secondary | ICD-10-CM | POA: Diagnosis not present

## 2022-01-11 ENCOUNTER — Ambulatory Visit (INDEPENDENT_AMBULATORY_CARE_PROVIDER_SITE_OTHER): Payer: Medicare Other | Admitting: Family Medicine

## 2022-01-11 ENCOUNTER — Encounter: Payer: Self-pay | Admitting: Family Medicine

## 2022-01-11 VITALS — BP 155/94 | HR 74 | Wt 143.0 lb

## 2022-01-11 DIAGNOSIS — M545 Low back pain, unspecified: Secondary | ICD-10-CM | POA: Diagnosis not present

## 2022-01-11 DIAGNOSIS — R112 Nausea with vomiting, unspecified: Secondary | ICD-10-CM | POA: Diagnosis not present

## 2022-01-11 DIAGNOSIS — N3000 Acute cystitis without hematuria: Secondary | ICD-10-CM

## 2022-01-11 DIAGNOSIS — M25571 Pain in right ankle and joints of right foot: Secondary | ICD-10-CM | POA: Diagnosis not present

## 2022-01-11 DIAGNOSIS — W19XXXA Unspecified fall, initial encounter: Secondary | ICD-10-CM

## 2022-01-11 DIAGNOSIS — R809 Proteinuria, unspecified: Secondary | ICD-10-CM

## 2022-01-11 LAB — POCT URINALYSIS DIP (CLINITEK)
Glucose, UA: 100 mg/dL — AB
Ketones, POC UA: NEGATIVE mg/dL
Leukocytes, UA: NEGATIVE
Nitrite, UA: NEGATIVE
POC PROTEIN,UA: >=300 — AB
Spec Grav, UA: >=1.03 — AB (ref 1.010–1.025)
Urobilinogen, UA: 1 E.U./dL
pH, UA: 6 (ref 5.0–8.0)

## 2022-01-11 NOTE — Addendum Note (Signed)
Addended by: Teddy Spike on: 01/11/2022 11:32 AM ? ? Modules accepted: Orders ? ?

## 2022-01-11 NOTE — Progress Notes (Signed)
? ?Established Patient Office Visit ? ?Subjective   ?Patient ID: Hannah Rojas, female    DOB: [birth year]  Age: 76 y.o. MRN: [id number] ? ?Chief Complaint  ?Patient presents with  ? Hospitalization Follow-up  ? ? ?HPI ?She is here today to follow-up from recent emergency department visit 2 days ago on April 24.  She fell on Saturday and went to the emergency department complaining of lower back pain and right ankle pain.  She said she fell on the concrete while putting a car seat into the car.  She denied hitting her head but reports that she started vomiting Saturday night.  They did an x-ray of the lumbar spine as well as her right ankle.  No acute fracture.  EKG was normal.  Urinalysis showed some glucose protein and blood, nitrates.  Per records it looks like there is a pending culture.  Glucose was 157, otherwise BMP was normal.  Lipase was normal.  Blood count was normal as well. She was started on Keflex.  She is not taking it. She is still nauseated.  Has zofran.  She says she did not feel lightheaded or dizzy or unusual right before the fall she thinks maybe her foot just caught on something on the sidewalk as she was trying to lift her granddaughter into the car seat.  She denies any abdominal cramping. ? ?She still feels nauseated today and says she has had a few dry heaves.  No diarrhea.  No fevers or chills.  No blood in the urine or dysuria.  She was able to keep down one of the Keflex tabs this morning so she is only been able to take 2 thus far. ? ? ? ? ?ROS ? ?  ?Objective:  ?  ? ?BP (!) 155/94   Pulse 74   Wt 143 lb (64.9 kg)   SpO2 94%   BMI 27.02 kg/m?  ? ? ?Physical Exam ?Vitals and nursing note reviewed.  ?Constitutional:   ?   Appearance: She is well-developed.  ?HENT:  ?   Head: Normocephalic and atraumatic.  ?Cardiovascular:  ?   Rate and Rhythm: Normal rate and regular rhythm.  ?   Heart sounds: Normal heart sounds.  ?Pulmonary:  ?   Effort: Pulmonary effort is normal.  ?   Breath  sounds: Normal breath sounds.  ?Abdominal:  ?   General: Bowel sounds are normal.  ?   Palpations: Abdomen is soft.  ?   Tenderness: There is abdominal tenderness.  ?   Comments: Diffuse mild tenderness.  ?Musculoskeletal:  ?   Comments: Mildly tender over her left low back lateral to the SI joint.  No bruising over the skin.  Right ankle just a little tender laterally.  Again no significant bruising or swelling.  Normal range of motion of the ankle.  ?Skin: ?   General: Skin is warm and dry.  ?Neurological:  ?   Mental Status: She is alert and oriented to person, place, and time.  ?Psychiatric:     ?   Behavior: Behavior normal.  ? ? ? ?Results for orders placed or performed in visit on 01/11/22  ?POCT URINALYSIS DIP (CLINITEK)  ?Result Value Ref Range  ? Color, UA straw (A) yellow  ? Clarity, UA cloudy (A) clear  ? Glucose, UA =100 (A) negative mg/dL  ? Bilirubin, UA small (A) negative  ? Ketones, POC UA negative negative mg/dL  ? Spec Grav, UA >=1.030 (A) 1.010 - 1.025  ? Blood,  UA moderate (A) negative  ? pH, UA 6.0 5.0 - 8.0  ? POC PROTEIN,UA >=300 (A) negative, trace  ? Urobilinogen, UA 1.0 0.2 or 1.0 E.U./dL  ? Nitrite, UA Negative Negative  ? Leukocytes, UA Negative Negative  ? ? ? ? ?The 10-year ASCVD risk score (Arnett DK, et al., 2019) is: 38% ? ?  ?Assessment & Plan:  ? ?Problem List Items Addressed This Visit   ?None ?Visit Diagnoses   ? ? Nausea and vomiting, unspecified vomiting type    -  Primary  ? Acute cystitis without hematuria      ? Relevant Orders  ? POCT URINALYSIS DIP (CLINITEK) (Completed)  ? Acute left-sided low back pain without sciatica      ? Acute right ankle pain      ? Fall, initial encounter      ? Proteinuria, unspecified type      ? ?  ? ?Nausea and vomiting-unclear etiology it started after her fall.  But she did not report any head injury so it is a little bit unusual.  Based on labs it looks like she probably had a UTI though she says she never experienced any dysuria or odor  to the urine discoloration or blood etc.  Still waiting on the urine culture to come back.  She was able to take the Zofran earlier on and then keep the Keflex down this morning.  For now continue with the Keflex until we get the urine culture results back we will get a repeat a urinalysis today as it does not sound like they did a clean-catch in the ED. clean-catch performed today and will send for culture. ? ?Right ankle pain-she says the ankle is actually gradually getting better she has been able to walk on it and it is less sore and tender. ? ?Left low back pain  - Still having some discomfort in the left low back-recommend ice and Tylenol.  If not improving over the next couple of weeks then please let us know. ? ?Proteinuria-plan to repeat at her follow-up for diabetes.  Unclear if it could be related to UTI or not.  Do want a follow that up.  Do a urine microalbumin at that time.  Serum creatinine was also slightly elevated in the ED at 1.4. .  Normally it is around 1.1.  She actually has a routine follow-up in about 2 weeks and so we can recheck a BMP and a urine albumin at that time.  ? ?She is worried about the potential pancreatitis but they did check a lipase in the ED and it was normal. ? ?Work note provided to be out until next Tuesday so she has some time to recover. ? ?No follow-ups on file.  ? ? ?Beatrice Lecher, MD ? ?

## 2022-01-12 ENCOUNTER — Telehealth: Payer: Self-pay | Admitting: Family Medicine

## 2022-01-12 NOTE — Telephone Encounter (Signed)
Pt and daughter advised of results and recommendations.  ?

## 2022-01-12 NOTE — Telephone Encounter (Signed)
Please call patient and let her know that the hospital urine culture came back negative.  Okay to stop the Keflex.  We will still let her know when her culture comes back just to be sure. ?

## 2022-01-13 LAB — URINE CULTURE
MICRO NUMBER:: 13320892
Result:: NO GROWTH
SPECIMEN QUALITY:: ADEQUATE

## 2022-01-16 ENCOUNTER — Ambulatory Visit (INDEPENDENT_AMBULATORY_CARE_PROVIDER_SITE_OTHER): Payer: Medicare Other

## 2022-01-16 ENCOUNTER — Encounter: Payer: Self-pay | Admitting: Family Medicine

## 2022-01-16 ENCOUNTER — Ambulatory Visit (INDEPENDENT_AMBULATORY_CARE_PROVIDER_SITE_OTHER): Payer: Medicare Other | Admitting: Family Medicine

## 2022-01-16 VITALS — BP 98/58 | HR 90 | Resp 18 | Ht 61.0 in | Wt 140.0 lb

## 2022-01-16 DIAGNOSIS — M533 Sacrococcygeal disorders, not elsewhere classified: Secondary | ICD-10-CM | POA: Diagnosis not present

## 2022-01-16 DIAGNOSIS — S322XXG Fracture of coccyx, subsequent encounter for fracture with delayed healing: Secondary | ICD-10-CM

## 2022-01-16 DIAGNOSIS — W19XXXD Unspecified fall, subsequent encounter: Secondary | ICD-10-CM | POA: Diagnosis not present

## 2022-01-16 DIAGNOSIS — R630 Anorexia: Secondary | ICD-10-CM

## 2022-01-16 MED ORDER — TRAMADOL HCL 50 MG PO TABS: 50.0000 mg | ORAL_TABLET | Freq: Three times a day (TID) | ORAL | 0 refills | Status: AC | PRN

## 2022-01-16 NOTE — Progress Notes (Signed)
Hi Hannah Rojas, your urine culture came back negative.  No worrisome findings.  Please let us know if you are still having any symptoms.

## 2022-01-16 NOTE — Progress Notes (Signed)
? ?Established Patient Office Visit ? ?Subjective   ?Patient ID: Hannah Rojas, female    DOB: 05-21-1946  Age: 76 y.o. MRN: [id number] ? ?Chief Complaint  ?Patient presents with  ? Weakness  ?  In buttock area since fall in 12/2021. Patient states she has difficulty sitting, standing and walking. Patient requesting for work note from 01/10/22 -01/23/22.   ? ? ?HPI ? ?In buttock area since fall in 12/2021. Patient states she has difficulty sitting, standing and walking. Patient requesting for work note from 01/10/22 -01/23/22.  He says the nausea that she was initially experiencing after the fall has gone away but she still has had a significantly decreased appetite.  Just has not felt great overall.  But she is making herself eat.  Reports that she had a prior coccyx fracture years ago when she lived in [place]. ? ?She is not currently taking anything for pain even her Tylenol. She has lost 3 lbs.   ? ? ? ?ROS ? ?  ?Objective:  ?  ? ?BP (!) 98/58 (BP Location: Left Arm)   Pulse 90   Resp 18   Ht '5\' 1"'$  (1.549 m)   Wt 140 lb (63.5 kg)   SpO2 96%   BMI 26.45 kg/m?  ? ? ?Physical Exam ?Vitals and nursing note reviewed.  ?Constitutional:   ?   Appearance: She is well-developed.  ?HENT:  ?   Head: Normocephalic and atraumatic.  ?Eyes:  ?   Conjunctiva/sclera: Conjunctivae normal.  ?Cardiovascular:  ?   Rate and Rhythm: Normal rate and regular rhythm.  ?   Heart sounds: Normal heart sounds.  ?Pulmonary:  ?   Effort: Pulmonary effort is normal.  ?   Breath sounds: Normal breath sounds.  ?Musculoskeletal:  ?   Comments: Very tender over the tip of the coccyx.  I do not feel any popping.  Nontender over the sacrum.  She had difficulty getting up from the sitting position  ?Skin: ?   General: Skin is warm and dry.  ?   Coloration: Skin is not pale.  ?Neurological:  ?   Mental Status: She is alert and oriented to person, place, and time.  ?Psychiatric:     ?   Behavior: Behavior normal.  ? ? ? ?No results found  for any visits on 01/16/22. ? ? ? ?The 10-year ASCVD risk score (Arnett DK, et al., 2019) is: 17.7% ? ?  ?Assessment & Plan:  ? ?Problem List Items Addressed This Visit   ?None ?Visit Diagnoses   ? ? Coccygeal pain    -  Primary  ? Relevant Medications  ? traMADol (ULTRAM) 50 MG tablet  ? Other Relevant Orders  ? DG Sacrum/Coccyx  ? Decreased appetite      ? ?  ? ? ?Potential-blood pressure is a little bit low today and can I have her hold her lisinopril even though she is on a very very tiny dose really for renal protection.  Encouraged her to make sure she is hydrating well. ? ?Decreased appetite-could be secondary to the Ozempic.  She says she is feeling better she is no longer vomiting but still does not have a good appetite and she is down 3 pounds.  She actually has a follow-up in about 2 weeks so we will check again at that time. ? ?Coccygeal pain-we will get plain film x-ray to rule out fracture in the meantime I did give her tramadol she can overlap that with her Tylenol Extra  Strength for pain relief recommend that she get a U-shaped cushion or donut shaped cushion for support and relief. ? ?No follow-ups on file.  ? ? ?Beatrice Lecher, MD ? ?

## 2022-01-16 NOTE — Patient Instructions (Signed)
Hold your lisinopril until I see her back.   ?

## 2022-01-17 NOTE — Progress Notes (Signed)
Hi Kohana, it does look like you might have a small anterior fracture they felt like it was recent.  So the best treatment is really just getting pressure off of the area so buying one of the you cushions to sit on.  Standing and doing a little bit of walking but not excessive Ecker's exercise.  And lets just see if over the next several weeks it starts to feel better you can absolutely use her Tylenol as needed and hopefully the tramadol is at least helping as well.

## 2022-01-23 ENCOUNTER — Ambulatory Visit (INDEPENDENT_AMBULATORY_CARE_PROVIDER_SITE_OTHER): Payer: Medicare Other | Admitting: Family Medicine

## 2022-01-23 ENCOUNTER — Encounter: Payer: Self-pay | Admitting: Family Medicine

## 2022-01-23 VITALS — BP 118/60 | HR 97 | Resp 16 | Ht 61.0 in | Wt 140.0 lb

## 2022-01-23 DIAGNOSIS — M533 Sacrococcygeal disorders, not elsewhere classified: Secondary | ICD-10-CM | POA: Diagnosis not present

## 2022-01-23 DIAGNOSIS — M25531 Pain in right wrist: Secondary | ICD-10-CM | POA: Diagnosis not present

## 2022-01-23 MED ORDER — TRAMADOL HCL 50 MG PO TABS: 50.0000 mg | ORAL_TABLET | Freq: Three times a day (TID) | ORAL | 0 refills | Status: DC | PRN

## 2022-01-23 NOTE — Progress Notes (Signed)
t ? ?Acute Office Visit ? ?Subjective:  ? ?  ?Patient ID: Hannah Rojas, female    DOB: 07-Jun-1946, 76 y.o.   MRN: [id number] ? ?Chief Complaint  ?Patient presents with  ? Follow-up  ?  Tailbone fracture. Patient states she is still having pain in her tailbone, left wrist and back. Patient requesting for extended work note.   ? Dry Lips   ?  Patient states he lips are dry and painful.   ? ? ?HPI ?Patient is in today for for follow-up of tailbone fracture.  She still having significant pain in her left wrist and back as well.  Fall occurred on in April.  He had originally written her work note out until today.  She has been using Tylenol and tramadol for pain.  I last saw her she was having significant nausea and even vomiting. ? ?We did an x-ray of the sacrum/coccyx showing new minimal anterior offset of the first coccygeal segment with respect to the fifth sacral segment.  Likely a small acute/subacute fracture ? ?Patient states he lips are dry and painful.   ? ?He also just started noticing that her wrists were starting to hurt she is also been using them to push up a lot instead of using her legs to push up because it is painful.  But the right wrist in particular feels very sore and painful especially posteriorly.  The left is hurting a little bit as well.  She has a lot of tenderness along the lateral side of the wrist at the base of the thumb. ? ?ROS ? ? ?   ?Objective:  ?  ?BP 118/60   Pulse 97   Resp 16   Ht '5\' 1"'$  (1.549 m)   Wt 140 lb (63.5 kg)   SpO2 100%   BMI 26.45 kg/m?  ? ? ?Physical Exam ?Vitals reviewed.  ?Constitutional:   ?   Appearance: She is well-developed.  ?HENT:  ?   Head: Normocephalic and atraumatic.  ?Eyes:  ?   Conjunctiva/sclera: Conjunctivae normal.  ?Cardiovascular:  ?   Rate and Rhythm: Normal rate.  ?Pulmonary:  ?   Effort: Pulmonary effort is normal.  ?Musculoskeletal:  ?   Comments: Right wrist is tender posteriorly.  Positive Finkelstein's test.  ?Skin: ?   General:  Skin is dry.  ?   Coloration: Skin is not pale.  ?Neurological:  ?   Mental Status: She is alert and oriented to person, place, and time.  ?Psychiatric:     ?   Behavior: Behavior normal.  ? ? ?No results found for any visits on 01/23/22. ? ? ?   ?Assessment & Plan:  ? ?Problem List Items Addressed This Visit   ?None ?Visit Diagnoses   ? ? Coccygeal pain    -  Primary  ? Relevant Medications  ? traMADol (ULTRAM) 50 MG tablet  ? Wrist pain, right      ? ?  ? ? ?Right wrist pain most consistent with de Quervain's tenosynovitis-I do not know if the injury occurred during her fall or just more recently from trying to push up on her wrists.  We will treat with a thumb spica splint.  To support the thumb.  We also discussed that she can use the back of her knuckles to push up and keep her wrist straight. ? ?Still having significant coccygeal pain-I went ahead and wrote her out for an additional 3 weeks we discussed that this is a fracture and  so it can be Painful for quite some time.  I also encouraged her to start moving a little bit more make sure she is lifting and moving her legs so that she is not getting weak.  She says the tramadol makes her sleepy so she is only been taking it twice a day so strongly encouraged her to use her Tylenol arthritis during the daytime to help with pain.  I did refill the tramadol just encouraged her to be careful with it. ? ?Her work will be sending over [employer] paperwork.  We will be happy to get that completed. ? ?Meds ordered this encounter  ?Medications  ? traMADol (ULTRAM) 50 MG tablet  ?  Sig: Take 1 tablet (50 mg total) by mouth every 8 (eight) hours as needed for up to 5 days.  ?  Dispense:  15 tablet  ?  Refill:  0  ? ? ?No follow-ups on file. ? ?Hannah Lecher, MD ? ? ?

## 2022-01-29 ENCOUNTER — Other Ambulatory Visit: Payer: Self-pay | Admitting: Family Medicine

## 2022-01-29 DIAGNOSIS — E119 Type 2 diabetes mellitus without complications: Secondary | ICD-10-CM

## 2022-01-29 DIAGNOSIS — F4329 Adjustment disorder with other symptoms: Secondary | ICD-10-CM

## 2022-01-30 ENCOUNTER — Encounter: Payer: Self-pay | Admitting: Family Medicine

## 2022-01-30 ENCOUNTER — Ambulatory Visit (INDEPENDENT_AMBULATORY_CARE_PROVIDER_SITE_OTHER): Payer: Medicare Other | Admitting: Family Medicine

## 2022-01-30 VITALS — BP 119/54 | HR 78 | Ht 61.0 in | Wt 140.0 lb

## 2022-01-30 DIAGNOSIS — E119 Type 2 diabetes mellitus without complications: Secondary | ICD-10-CM

## 2022-01-30 DIAGNOSIS — E1169 Type 2 diabetes mellitus with other specified complication: Secondary | ICD-10-CM

## 2022-01-30 DIAGNOSIS — N1831 Chronic kidney disease, stage 3a: Secondary | ICD-10-CM | POA: Diagnosis not present

## 2022-01-30 DIAGNOSIS — E785 Hyperlipidemia, unspecified: Secondary | ICD-10-CM | POA: Diagnosis not present

## 2022-01-30 DIAGNOSIS — F4329 Adjustment disorder with other symptoms: Secondary | ICD-10-CM | POA: Diagnosis not present

## 2022-01-30 LAB — POCT GLYCOSYLATED HEMOGLOBIN (HGB A1C): Hemoglobin A1C: 6.7 % — AB (ref 4.0–5.6)

## 2022-01-30 MED ORDER — OZEMPIC (2 MG/DOSE) 8 MG/3ML ~~LOC~~ SOPN: 2.0000 mg | PEN_INJECTOR | SUBCUTANEOUS | 3 refills | Status: DC

## 2022-01-30 NOTE — Patient Instructions (Addendum)
Okay to stop your Levemir. ?Continue to work on portion control and healthy food choices.  As you are feeling better continue to work on increasing activity level.  When you run out of the 1 mg Ozempic then please start the 2 mg Ozempic. ?

## 2022-01-30 NOTE — Assessment & Plan Note (Signed)
Due for BMP and urine microalbumin today. ?

## 2022-01-30 NOTE — Assessment & Plan Note (Signed)
She feels like she is doing okay on the 5 mg of Lexapro we did discuss the possibility of going up but she would rather just stay at the 5 for now. ?

## 2022-01-30 NOTE — Assessment & Plan Note (Addendum)
Well controlled. She has done well. We will go ahead and discontinue the Levemir.  She is really only using 8 units a day.  I would love to be able to continue to work on decreasing her metformin as well.  Just encouraged her to continue to work on portion control and healthy food choices. Follow up in  3-4 months.  ?

## 2022-01-30 NOTE — Progress Notes (Signed)
? ?Established Patient Office Visit ? ?Subjective   ?Patient ID: Hannah Rojas, female    DOB: 10-Jun-1946  Age: 76 y.o. MRN: [id number] ? ?Chief Complaint  ?Patient presents with  ? Diabetes  ? ? ?HPI ? ?Diabetes - no hypoglycemic events. No wounds or sores that are not healing well. No increased thirst or urination. Checking glucose at home. Taking medications as prescribed without any side effects. She has had a dec appetitie since her recent fall.  She hasn't been able to exercise bc of her injury.   ? ?She is getting a little bit better in regards to her tailbone fracture.  Pain is becoming a little less she is been able to move a little bit more.  But she still taking her tramadol twice a day. ? ?Follow-up on mood-she feels like the Lexapro 5 mg is actually helping and she is doing well with it. ? ? ? ?ROS ? ?  ?Objective:  ?  ? ?BP (!) 119/54   Pulse 78   Ht '5\' 1"'$  (1.549 m)   Wt 140 lb (63.5 kg)   SpO2 96%   BMI 26.45 kg/m?  ? ? ?Physical Exam ?Vitals and nursing note reviewed.  ?Constitutional:   ?   Appearance: She is well-developed.  ?HENT:  ?   Head: Normocephalic and atraumatic.  ?Cardiovascular:  ?   Rate and Rhythm: Normal rate and regular rhythm.  ?   Heart sounds: Normal heart sounds.  ?Pulmonary:  ?   Effort: Pulmonary effort is normal.  ?   Breath sounds: Normal breath sounds.  ?Skin: ?   General: Skin is warm and dry.  ?Neurological:  ?   Mental Status: She is alert and oriented to person, place, and time.  ?Psychiatric:     ?   Behavior: Behavior normal.  ? ? ? ?Results for orders placed or performed in visit on 01/30/22  ?POCT glycosylated hemoglobin (Hb A1C)  ?Result Value Ref Range  ? Hemoglobin A1C 6.7 (A) 4.0 - 5.6 %  ? HbA1c POC (<> result, manual entry)    ? HbA1c, POC (prediabetic range)    ? HbA1c, POC (controlled diabetic range)    ? ? ? ? ?The 10-year ASCVD risk score (Arnett DK, et al., 2019) is: 24.8% ? ?  ?Assessment & Plan:  ? ?Problem List Items Addressed This Visit    ? ?  ? Endocrine  ? Hyperlipidemia associated with type 2 diabetes mellitus ([place])  ? Relevant Medications  ? Semaglutide, 2 MG/DOSE, (OZEMPIC, 2 MG/DOSE,) 8 MG/3ML SOPN  ? Diabetes mellitus, type II ([place]) - Primary  ?  Well controlled. She has done well. We will go ahead and discontinue the Levemir.  She is really only using 8 units a day.  I would love to be able to continue to work on decreasing her metformin as well.  Just encouraged her to continue to work on portion control and healthy food choices. Follow up in  3-4 months.  ? ?  ?  ? Relevant Medications  ? Semaglutide, 2 MG/DOSE, (OZEMPIC, 2 MG/DOSE,) 8 MG/3ML SOPN  ? Other Relevant Orders  ? BASIC METABOLIC PANEL WITH GFR  ? Urine Microalbumin w/creat. ratio  ? POCT glycosylated hemoglobin (Hb A1C) (Completed)  ?  ? Genitourinary  ? KIDNEY DISEASE, CHRONIC, STAGE III  ?  Due for BMP and urine microalbumin today. ? ?  ?  ? Relevant Orders  ? BASIC METABOLIC PANEL WITH GFR  ? Urine Microalbumin  w/creat. ratio  ?  ? Other  ? Stress and adjustment reaction  ?  She feels like she is doing okay on the 5 mg of Lexapro we did discuss the possibility of going up but she would rather just stay at the 5 for now. ? ?  ?  ? ? ?Still struggling with her tailbone injury but she does feel like she is gradually getting a little bit better. ? ?Return in about 14 weeks (around 05/08/2022) for Diabetes follow-up.  ? ? ?Beatrice Lecher, MD ? ?

## 2022-01-31 ENCOUNTER — Telehealth: Payer: Self-pay | Admitting: *Deleted

## 2022-01-31 LAB — MICROALBUMIN / CREATININE URINE RATIO
Creatinine, Urine: 243 mg/dL (ref 20–275)
Microalb Creat Ratio: 25 mcg/mg creat (ref <30)
Microalb, Ur: 6.1 mg/dL

## 2022-01-31 LAB — BASIC METABOLIC PANEL WITH GFR
BUN/Creatinine Ratio: 16 (calc) (ref 6–22)
BUN: 17 mg/dL (ref 7–25)
CO2: 27 mmol/L (ref 20–32)
Calcium: 10 mg/dL (ref 8.6–10.4)
Chloride: 102 mmol/L (ref 98–110)
Creat: 1.07 mg/dL — ABNORMAL HIGH (ref 0.60–1.00)
Glucose, Bld: 94 mg/dL (ref 65–99)
Potassium: 4.9 mmol/L (ref 3.5–5.3)
Sodium: 140 mmol/L (ref 135–146)
eGFR: 54 mL/min/{1.73_m2} — ABNORMAL LOW (ref >=60)

## 2022-01-31 NOTE — Progress Notes (Signed)
Hi Collier, kidney function is stable at 1.0.  Electrolytes are normal.  No significant protein levels in the urine which is good.

## 2022-01-31 NOTE — Telephone Encounter (Signed)
Called pt and informed her that she will need to p/u the Ozempic from her local pharmacy. ? ?She was told that she has not completed an application for the Ozempic and this would need to be completed and approved for her to get this.  ? ?Pt told that I have printed out an application for her and she can come by and pick this up complete this and bring it back in and we will fax it for her.  ? ?She said that she would come by later this week to get this.  ?

## 2022-02-03 ENCOUNTER — Telehealth: Payer: Self-pay | Admitting: Family Medicine

## 2022-02-03 NOTE — Telephone Encounter (Signed)
Yes ok for walker. We can provide note

## 2022-02-03 NOTE — Telephone Encounter (Signed)
Patient was in office this afternoon and stated she was written out of work until 02/14/22 and wanted to know if it could be added to the note that she could bring a walker in and use, if needed? AMUCK

## 2022-02-06 NOTE — Telephone Encounter (Signed)
New note written for pt's employer including the use of a walker.  Pt informed that she may pick up at the front desk.  Charyl Bigger, CMA

## 2022-02-07 ENCOUNTER — Telehealth: Payer: Self-pay | Admitting: *Deleted

## 2022-02-07 NOTE — Telephone Encounter (Signed)
Pt's FMLA forms completed and placed up front for pt to be called, fee collected and forms to be faxed and scanned.

## 2022-02-08 NOTE — Telephone Encounter (Signed)
Spoke with patient and let her know forms are up front ready to pick up at her convenience & there is a $29 fee.

## 2022-02-10 ENCOUNTER — Telehealth: Payer: Self-pay | Admitting: Family Medicine

## 2022-02-10 NOTE — Telephone Encounter (Signed)
Patient came in to office and dropped of W/C paperwork - patient was informed of a possible fee and 3-5 day turnaround - paperwork placed in Dr. Lacie Scotts box - lmr.

## 2022-02-14 ENCOUNTER — Other Ambulatory Visit: Payer: Self-pay | Admitting: Family Medicine

## 2022-02-18 DIAGNOSIS — Z7984 Long term (current) use of oral hypoglycemic drugs: Secondary | ICD-10-CM | POA: Diagnosis not present

## 2022-02-18 DIAGNOSIS — Z91041 Radiographic dye allergy status: Secondary | ICD-10-CM | POA: Diagnosis not present

## 2022-02-18 DIAGNOSIS — Z79899 Other long term (current) drug therapy: Secondary | ICD-10-CM | POA: Diagnosis not present

## 2022-02-18 DIAGNOSIS — Z886 Allergy status to analgesic agent status: Secondary | ICD-10-CM | POA: Diagnosis not present

## 2022-02-18 DIAGNOSIS — Z7985 Long-term (current) use of injectable non-insulin antidiabetic drugs: Secondary | ICD-10-CM | POA: Diagnosis not present

## 2022-02-18 DIAGNOSIS — E119 Type 2 diabetes mellitus without complications: Secondary | ICD-10-CM | POA: Diagnosis not present

## 2022-02-18 DIAGNOSIS — R079 Chest pain, unspecified: Secondary | ICD-10-CM | POA: Diagnosis not present

## 2022-02-18 DIAGNOSIS — R0789 Other chest pain: Secondary | ICD-10-CM | POA: Diagnosis not present

## 2022-02-18 DIAGNOSIS — Z743 Need for continuous supervision: Secondary | ICD-10-CM | POA: Diagnosis not present

## 2022-02-18 DIAGNOSIS — Z882 Allergy status to sulfonamides status: Secondary | ICD-10-CM | POA: Diagnosis not present

## 2022-02-19 DIAGNOSIS — R0789 Other chest pain: Secondary | ICD-10-CM | POA: Diagnosis not present

## 2022-02-19 DIAGNOSIS — R000 Tachycardia, unspecified: Secondary | ICD-10-CM | POA: Diagnosis not present

## 2022-02-19 DIAGNOSIS — R079 Chest pain, unspecified: Secondary | ICD-10-CM | POA: Diagnosis not present

## 2022-02-20 DIAGNOSIS — R079 Chest pain, unspecified: Secondary | ICD-10-CM | POA: Diagnosis not present

## 2022-02-20 DIAGNOSIS — R609 Edema, unspecified: Secondary | ICD-10-CM | POA: Diagnosis not present

## 2022-02-20 DIAGNOSIS — I10 Essential (primary) hypertension: Secondary | ICD-10-CM | POA: Diagnosis not present

## 2022-02-20 NOTE — Telephone Encounter (Signed)
Completed to be able to use walker

## 2022-04-11 ENCOUNTER — Ambulatory Visit (INDEPENDENT_AMBULATORY_CARE_PROVIDER_SITE_OTHER): Payer: Medicare Other | Admitting: Family Medicine

## 2022-04-11 ENCOUNTER — Encounter: Payer: Self-pay | Admitting: Family Medicine

## 2022-04-11 VITALS — BP 138/83 | HR 82 | Ht 61.0 in | Wt 133.0 lb

## 2022-04-11 DIAGNOSIS — R112 Nausea with vomiting, unspecified: Secondary | ICD-10-CM | POA: Diagnosis not present

## 2022-04-11 DIAGNOSIS — A084 Viral intestinal infection, unspecified: Secondary | ICD-10-CM | POA: Diagnosis not present

## 2022-04-11 DIAGNOSIS — E860 Dehydration: Secondary | ICD-10-CM | POA: Insufficient documentation

## 2022-04-11 MED ORDER — ONDANSETRON HCL 4 MG PO TABS: 4.0000 mg | ORAL_TABLET | Freq: Once | ORAL | Status: DC

## 2022-04-11 MED ORDER — ONDANSETRON 4 MG PO TBDP
4.0000 mg | ORAL_TABLET | Freq: Once | ORAL | Status: AC
  Administered 2022-04-11: 4 mg via ORAL

## 2022-04-11 MED ORDER — ONDANSETRON HCL 4 MG PO TABS: 4.0000 mg | ORAL_TABLET | Freq: Three times a day (TID) | ORAL | 0 refills | Status: DC | PRN

## 2022-04-11 NOTE — Assessment & Plan Note (Addendum)
-   given zofran ODT in clinic due to patient try heaving  - have discussed with patient and daughter-in-law signs and symptoms about precautions to go to the ED---unable to keep liquids and food down  - pt given prescription for zofran

## 2022-04-11 NOTE — Assessment & Plan Note (Addendum)
-   unable to start IVF in clinic for patient due to poor veins.  - tried ultrasound guided IVF and still unable to get veins  - advised pt to go home and drink fluids

## 2022-04-11 NOTE — Progress Notes (Signed)
Acute Office Visit  Subjective:     Patient ID: Hannah Rojas, female    DOB: 1945-10-25, 76 y.o.   MRN: [id number]  Chief Complaint  Patient presents with   Nausea   Emesis    Pt has had nausea and vomiting for the past few days. No appetite. Did have a banana this am and was able to drink ginger ale. Does admits to diarrhea. Does admit to chills. Does not have any sick contacts to her knowledge. She does work at [employer].     Review of Systems  Constitutional:  Positive for chills. Negative for fever.  Respiratory:  Negative for cough and shortness of breath.   Cardiovascular:  Negative for chest pain.  Gastrointestinal:  Positive for abdominal pain, nausea and vomiting.  Neurological:  Negative for headaches.        Objective:    BP 138/83   Pulse 82   Ht '5\' 1"'$  (1.549 m)   Wt 133 lb (60.3 kg)   SpO2 98%   BMI 25.13 kg/m    Physical Exam Vitals and nursing note reviewed.  Constitutional:      General: She is not in acute distress.    Appearance: Normal appearance.  HENT:     Head: Normocephalic and atraumatic.     Right Ear: External ear normal.     Left Ear: External ear normal.     Nose: Nose normal.  Eyes:     Conjunctiva/sclera: Conjunctivae normal.  Cardiovascular:     Rate and Rhythm: Normal rate and regular rhythm.  Pulmonary:     Effort: Pulmonary effort is normal.     Breath sounds: Normal breath sounds.  Abdominal:     Comments: Deferred exam as pt began vomiting in clinic   Neurological:     General: No focal deficit present.     Mental Status: She is alert and oriented to person, place, and time.  Psychiatric:        Mood and Affect: Mood normal.        Behavior: Behavior normal.        Thought Content: Thought content normal.        Judgment: Judgment normal.     No results found for any visits on 04/11/22.      Assessment & Plan:   Problem List Items Addressed This Visit       Digestive   Viral gastroenteritis    -  with N/V and abdominal symptoms likely this is a gastroenteritis.  - have ordered IVF LR in clinic for dehydration; pt receiving fluids  - recommend bland diet of crackers and jelly  - will order CBC to check for infection and BMP to look at electrolytes  - given RTC precautions as well as ED precautions        Nausea and vomiting - Primary    - given zofran ODT in clinic due to patient try heaving  - have discussed with patient and daughter-in-law signs and symptoms about precautions to go to the ED---unable to keep liquids and food down  - pt given prescription for zofran      Relevant Orders   BASIC METABOLIC PANEL WITH GFR   CBC     Other   Dehydration    - unable to start IVF in clinic for patient due to poor veins.  - tried ultrasound guided IVF and still unable to get veins  - advised pt to go home and drink fluids  Meds ordered this encounter  Medications   ondansetron (ZOFRAN) 4 MG tablet    Sig: Take 1 tablet (4 mg total) by mouth every 8 (eight) hours as needed for nausea or vomiting.    Dispense:  20 tablet    Refill:  0    Return in about 1 week (around 04/18/2022).  Owens Loffler, DO

## 2022-04-11 NOTE — Assessment & Plan Note (Signed)
-   with N/V and abdominal symptoms likely this is a gastroenteritis.  - have ordered IVF LR in clinic for dehydration; pt receiving fluids  - recommend bland diet of crackers and jelly  - will order CBC to check for infection and BMP to look at electrolytes  - given RTC precautions as well as ED precautions

## 2022-04-12 LAB — CBC
HCT: 45.8 % — ABNORMAL HIGH (ref 35.0–45.0)
Hemoglobin: 15.5 g/dL (ref 11.7–15.5)
MCH: 30.8 pg (ref 27.0–33.0)
MCHC: 33.8 g/dL (ref 32.0–36.0)
MCV: 91.1 fL (ref 80.0–100.0)
MPV: 12 fL (ref 7.5–12.5)
Platelets: 284 10*3/uL (ref 140–400)
RBC: 5.03 10*6/uL (ref 3.80–5.10)
RDW: 13.6 % (ref 11.0–15.0)
WBC: 12.9 10*3/uL — ABNORMAL HIGH (ref 3.8–10.8)

## 2022-04-12 LAB — BASIC METABOLIC PANEL WITH GFR
BUN/Creatinine Ratio: 28 (calc) — ABNORMAL HIGH (ref 6–22)
BUN: 28 mg/dL — ABNORMAL HIGH (ref 7–25)
CO2: 26 mmol/L (ref 20–32)
Calcium: 10.2 mg/dL (ref 8.6–10.4)
Chloride: 94 mmol/L — ABNORMAL LOW (ref 98–110)
Creat: 1.01 mg/dL — ABNORMAL HIGH (ref 0.60–1.00)
Glucose, Bld: 126 mg/dL — ABNORMAL HIGH (ref 65–99)
Potassium: 4 mmol/L (ref 3.5–5.3)
Sodium: 134 mmol/L — ABNORMAL LOW (ref 135–146)
eGFR: 58 mL/min/{1.73_m2} — ABNORMAL LOW (ref >=60)

## 2022-04-16 NOTE — Progress Notes (Unsigned)
     Established patient visit   Patient: Hannah Rojas   DOB: Aug 07, 1946   76 y.o. Female  MRN: [id number] Visit Date: 04/17/2022  Today's healthcare provider: Owens Loffler, DO   No chief complaint on file.   SUBJECTIVE   No chief complaint on file.  Pt presents for one week follow up for viral gastroenteritis. She was prescribed zofran and instructed to drink lots of fluids. Pt has improved and has even been back to work.       Review of Systems     No outpatient medications have been marked as taking for the 04/17/22 encounter (Appointment) with Owens Loffler, DO.    OBJECTIVE    There were no vitals taken for this visit.  Physical Exam   {Show previous labs (optional):23736}    ASSESSMENT & PLAN    Problem List Items Addressed This Visit   None   No follow-ups on file.      No orders of the defined types were placed in this encounter.   No orders of the defined types were placed in this encounter.    Owens Loffler, DO  [facility] [phone] (phone) [phone] (fax)  [place]

## 2022-04-17 ENCOUNTER — Ambulatory Visit (INDEPENDENT_AMBULATORY_CARE_PROVIDER_SITE_OTHER): Payer: Medicare Other | Admitting: Family Medicine

## 2022-04-17 ENCOUNTER — Telehealth: Payer: Self-pay | Admitting: Family Medicine

## 2022-04-17 VITALS — BP 107/63 | HR 100 | Ht 61.0 in | Wt 138.0 lb

## 2022-04-17 DIAGNOSIS — Z8619 Personal history of other infectious and parasitic diseases: Secondary | ICD-10-CM | POA: Diagnosis not present

## 2022-04-17 NOTE — Assessment & Plan Note (Signed)
-   pt improved with no further workup needed

## 2022-04-17 NOTE — Telephone Encounter (Signed)
Pt came in this afternoon for a sick appt with Dr.Wachs. She was inquiring about her Ozempic. She is out of refills and was inquiring about samples.

## 2022-04-18 ENCOUNTER — Ambulatory Visit: Payer: Medicare Other | Admitting: Family Medicine

## 2022-04-18 NOTE — Telephone Encounter (Signed)
Left message for patient to return call. We do not have any samples.

## 2022-04-25 ENCOUNTER — Telehealth: Payer: Self-pay | Admitting: Pharmacist

## 2022-04-25 NOTE — Progress Notes (Signed)
I have contacted [facility] about patients Ozempic medication. The representative stated she was not able to pull up the patients information. It looks like she hasn't received her medication through [facility] since she does not show up in there system. There is a note that was made back on 01/31/22 where the patient needed to pick up the Ozempic application from the office. Unsure if the patient has completed the form since then. I attempted to reach out to the patient to see the status on it but phone line was either disconnected or busy.  Corrie Mckusick, [place]

## 2022-04-26 ENCOUNTER — Ambulatory Visit (INDEPENDENT_AMBULATORY_CARE_PROVIDER_SITE_OTHER): Payer: Medicare Other | Admitting: Pharmacist

## 2022-04-26 DIAGNOSIS — E1169 Type 2 diabetes mellitus with other specified complication: Secondary | ICD-10-CM

## 2022-04-26 DIAGNOSIS — E119 Type 2 diabetes mellitus without complications: Secondary | ICD-10-CM

## 2022-04-26 MED ORDER — SEMAGLUTIDE (1 MG/DOSE) 4 MG/3ML ~~LOC~~ SOPN: 1.0000 mg | PEN_INJECTOR | SUBCUTANEOUS | 0 refills | Status: DC

## 2022-04-26 NOTE — Patient Instructions (Signed)
Visit Information  Thank you for taking time to visit with me today. Please don't hesitate to contact me if I can be of assistance to you before our next scheduled telephone appointment.  Following are the goals we discussed today:   Patient Goals/Self-Care Activities Over the next 14 days, patient will:  take medications as prescribed and collaborate with provider on medication access solutions  Follow Up Plan: Telephone follow up appointment with care management team member scheduled for: 2-3 weeks  Please call the care guide team at [phone] if you need to cancel or reschedule your appointment.    Patient verbalizes understanding of instructions and care plan provided today and agrees to view in [place]. Active MyChart status and patient understanding of how to access instructions and care plan via MyChart confirmed with patient.     Hannah Rojas

## 2022-04-26 NOTE — Addendum Note (Signed)
Addended by: Beatrice Lecher D on: 04/26/2022 05:10 PM   Modules accepted: Orders

## 2022-04-26 NOTE — Progress Notes (Signed)
Chronic Care Management Pharmacy Note  04/26/2022 Name:  Hannah Rojas MRN:  [id number] DOB:  04/02/1946  Summary: addressed HLD and primarily DM. Patient is on ozempic and metformin, but ran out of ozempic x 2 weeks due to unable to obtain 2mg ozempic at pharmacy.  Recommendations/Changes made from today's visit:   - Continue ozempic at 1mg weekly, using RX sent to pharmacy - Patient assistance application placed up at front desk for patient to fill out, return, and we will process for 2mg dosing.  Plan: f/u with pharmacist in 2-3 weeks  Subjective: Hannah Rojas is an 75 y.o. year old female who is a primary patient of [name], Catherine D, MD.  The CCM team was consulted for assistance with disease management and care coordination needs.    Engaged with patient by telephone for follow up visit in response to provider referral for pharmacy case management and/or care coordination services.   Consent to Services:  The patient was given information about Chronic Care Management services, agreed to services, and gave verbal consent prior to initiation of services.  Please see initial visit note for detailed documentation.   Patient Care Team: [name], Catherine D, MD as PCP - General Kline, Keesha J, RPH as Pharmacist (Pharmacist)   Objective:  Lab Results  Component Value Date   CREATININE 1.01 (H) 04/11/2022   CREATININE 1.07 (H) 01/30/2022   CREATININE 1.10 (H) 08/01/2021    Lab Results  Component Value Date   HGBA1C 6.7 (A) 01/30/2022   Last diabetic Eye exam:  Lab Results  Component Value Date/Time   HMDIABEYEEXA No Retinopathy 12/01/2019 12:00 AM    Last diabetic Foot exam: No results found for: "HMDIABFOOTEX"      Component Value Date/Time   CHOL 140 01/31/2021 0000   TRIG 133 01/31/2021 0000   HDL 65 01/31/2021 0000   CHOLHDL 2.2 01/31/2021 0000   VLDL 19 03/16/2016 0925   LDLCALC 54 01/31/2021 0000   LDLDIRECT 107 (H) 05/27/2014 1010        Latest Ref Rng & Units 01/31/2021   12:00 AM 12/22/2019    9:18 AM 02/12/2019    3:33 PM  Hepatic Function  Total Protein 6.1 - 8.1 g/dL 7.6  7.8  7.3   AST 10 - 35 U/L 20  21  20   ALT 6 - 29 U/L 16  17  17   Total Bilirubin 0.2 - 1.2 mg/dL 0.9  0.9  0.5     Lab Results  Component Value Date/Time   TSH 2.18 03/29/2020 08:44 AM   TSH 1.940 03/04/2012 10:30 AM       Latest Ref Rng & Units 04/11/2022   12:00 AM 01/31/2021   12:00 AM 03/29/2020    8:44 AM  CBC  WBC 3.8 - 10.8 Thousand/uL 12.9  10.5  10.9   Hemoglobin 11.7 - 15.5 g/dL 15.5  14.6  14.2   Hematocrit 35.0 - 45.0 % 45.8  43.8  42.1   Platelets 140 - 400 Thousand/uL 284  259  293     No results found for: "VD25OH"  Clinical ASCVD: No  The 10-year ASCVD risk score (Arnett DK, et al., 2019) is: 26.6%   Values used to calculate the score:     Age: 75 years     Sex: Female     Is Non-Hispanic African American: No     Diabetic: Yes     Tobacco smoker: No     Systolic Blood Pressure:   107 mmHg     Is BP treated: Yes     HDL Cholesterol: 65 mg/dL     Total Cholesterol: 140 mg/dL     Social History   Tobacco Use  Smoking Status Never  Smokeless Tobacco Never   BP Readings from Last 3 Encounters:  04/17/22 107/63  04/11/22 138/83  01/30/22 (!) 119/54   Pulse Readings from Last 3 Encounters:  04/17/22 100  04/11/22 82  01/30/22 78   Wt Readings from Last 3 Encounters:  04/17/22 138 lb (62.6 kg)  04/11/22 133 lb (60.3 kg)  01/30/22 140 lb (63.5 kg)    Assessment: Review of patient past medical history, allergies, medications, health status, including review of consultants reports, laboratory and other test data, was performed as part of comprehensive evaluation and provision of chronic care management services.   SDOH:  (Social Determinants of Health) assessments and interventions performed:    CCM Care Plan  Allergies  Allergen Reactions   Ibuprofen Rash   Iodinated Contrast Media Rash     IV Contrast   Sulfa Antibiotics Rash   Atorvastatin Other (See Comments)    HA   Naproxen Sodium    Pravastatin Other (See Comments)    dizziness   Simvastatin Other (See Comments)    Nightmares   Sulfonamide Derivatives     REACTION: rash   Trazodone And Nefazodone Other (See Comments)    Nightmares    Xigduo Xr [Dapagliflozin-Metformin Hcl Er] Other (See Comments)    Excessive fatigue. Tolerates metformin alone.    Medications Reviewed Today     Reviewed by Owens Loffler, DO (Physician) on 04/17/22 at 1455  Med List Status: <None>   Medication Order Taking? Sig Documenting Provider Last Dose Status Informant  Acetaminophen (TYLENOL 8 HOUR ARTHRITIS PAIN PO) [id number] Yes Take 1 tablet by mouth at bedtime as needed. [provider] Taking Active Self  blood glucose meter kit and supplies [id number] Yes Dispense based on patient and insurance preference. Use up to four times daily as directed. Dx:E11.9 Hali Marry, MD Taking Active   escitalopram (LEXAPRO) 5 MG tablet [id number] Yes Take 1 tablet by mouth once daily Hali Marry, MD Taking Active   glucose blood test strip [id number] Yes TO CHECK GLUCOSE UP TO 4 TIMES DAILY AS DIRECTED. Hali Marry, MD Taking Active   lisinopril (ZESTRIL) 2.5 MG tablet [id number] Yes Take 1 tablet by mouth once daily Hali Marry, MD Taking Active   melatonin 5 MG TABS [id number] Yes Take 5 mg by mouth at bedtime. [provider] Taking Active   metFORMIN (GLUCOPHAGE-XR) 500 MG 24 hr tablet [id number] Yes TAKE 2 TABLETS BY MOUTH ONCE DAILY WITH BREAKFAST Hali Marry, MD Taking Active   ondansetron [facility]) 4 MG tablet [id number] Yes Take 1 tablet (4 mg total) by mouth every 8 (eight) hours as needed for nausea or vomiting. Owens Loffler, DO Taking Active   ondansetron (ZOFRAN-ODT) 4 MG disintegrating tablet [id number] Yes Take by mouth. [provider] Taking Active   rosuvastatin  (CRESTOR) 20 MG tablet [id number] Yes TAKE 1 TABLET BY MOUTH AT BEDTIME Hali Marry, MD Taking Active   Semaglutide, 2 MG/DOSE, (OZEMPIC, 2 MG/DOSE,) 8 MG/3ML SOPN [id number] Yes Inject 2 mg into the skin every 7 (seven) days. Hali Marry, MD Taking Active   traMADol Veatrice Bourbon) 50 MG tablet [id number] Yes TAKE 1 TABLET BY MOUTH EVERY 8 HOURS AS NEEDED FOR  UP  TO  5  DAYS [name], Catherine D, MD Taking Active             Patient Active Problem List   Diagnosis Date Noted   H/O viral gastroenteritis 04/17/2022   Viral gastroenteritis 04/11/2022   Nausea and vomiting 04/11/2022   Dehydration 04/11/2022   Stress and adjustment reaction 10/31/2021   Seasonal allergies 03/29/2020   History of iron deficiency 03/29/2020   Hyperlipidemia associated with type 2 diabetes mellitus (HCC) 03/13/2016   Obesity (BMI 30-39.9) 11/19/2013   History of splenectomy 01/06/2013   Oral lesion - Fibroma/Reactive Changes to tounge 08/07/2012   Pancreatic cyst 07/12/2011   Diabetes mellitus, type II (HCC) 05/17/2007   KIDNEY DISEASE, CHRONIC, STAGE III 05/17/2007   ABNORMAL SERUM ENZYME LEVEL NEC 05/16/2007   CARRIER, VIRAL HEPATITIS C 05/16/2007   SCOLIOSIS 06/26/2006    Immunization History  Administered Date(s) Administered   Fluad Quad(high Dose 65+) 06/23/2019, 08/01/2021   H1N1 07/17/2008   Influenza Split 06/10/2012   Influenza Whole 08/27/2007, 06/09/2008, 06/18/2009, 07/13/2009, 06/20/2010   Influenza, High Dose Seasonal PF 06/12/2017, 06/19/2018, 07/02/2020   Influenza,inj,Quad PF,6+ Mos 05/12/2013, 05/21/2014, 06/03/2015, 06/15/2016   Moderna Sars-Covid-2 Vaccination 12/26/2019, 01/17/2020, 08/16/2020, 03/15/2021   Pneumococcal Conjugate-13 12/02/2014   Pneumococcal Polysaccharide-23 08/27/2007, 11/20/2011   Td 06/19/2005   Tdap 01/25/2017   Zoster, Live 07/20/2010    Conditions to be addressed/monitored: HLD and DMII  Care Plan : Medication Management  Updates  made by Kline, Keesha J, RPH since 04/26/2022 12:00 AM     Problem: DM, HLD      Long-Range Goal: Disease Progression Prevention   Start Date: 03/07/2021  Recent Progress: On track  Priority: High  Note:   Current Barriers:  Unable to independently afford treatment regimen   Pharmacist Clinical Goal(s):  Over the next 14 days, patient will adhere to plan to optimize therapeutic regimen for diabetes as evidenced by report of adherence to recommended medication management changes through collaboration with PharmD and provider.   Interventions: 1:1 collaboration with [name], Catherine D, MD regarding development and update of comprehensive plan of care as evidenced by provider attestation and co-signature Inter-disciplinary care team collaboration (see longitudinal plan of care) Comprehensive medication review performed; medication list updated in electronic medical record  Diabetes:  Controlled; current treatment: metformin 500mg BID, ozempic 1 mg weekly  Current glucose readings: 130s fasting glucose. Not currently checking postprandial glucose.  Denies hypoglycemic symptoms   Previously discussed current meal patterns: breakfast: banana; lunch: subway or salad; dinner: snacks on small things or frozen dinner ; snacks: chips, or cheezy's, handful at most; drinks: diet coke zero, water   Current exercise: walking, works full time at [employer]  Previously educated on benefits of low-dose ACEI for renal protection & statin for protection against cardiovascular & cerebrovascular events  Recommend continue ozempic 1mg weekly  Hyperlipidemia:  Controlled; current treatment:rosuvastatin 20mg; LDL 54  Recommended continue current regimen  Patient Goals/Self-Care Activities Over the next 14 days, patient will:  take medications as prescribed and collaborate with provider on medication access solutions  Follow Up Plan: Telephone follow up appointment with care management team member  scheduled for: 2-3 weeks       Medication Assistance: Application for ozempic  medication assistance program. in process.  Anticipated assistance start date TBD.  See plan of care for additional detail.  Patient's preferred pharmacy is:  [id number] - [place], [place] - [address] [place] [address] Phone: [phone] Fax: [phone]     Uses pill box? Yes Pt endorses 100% compliance  Follow Up:  Patient agrees to Care Plan and Follow-up.  Plan: Telephone follow up appointment with care management team member scheduled for:  2 -3 weeks  Keesha Kline, PharmD Clinical Pharmacist [facility] [phone]      

## 2022-05-03 ENCOUNTER — Telehealth: Payer: Self-pay

## 2022-05-03 NOTE — Telephone Encounter (Signed)
I would encourage her to speak with her pharmacist.  She has been on Ozempic so I am not sure if this is a sudden change in cost?  If she has met her Medicare gap?  If she is met her Medicare gap and she might polyp 5 for patient assistance and we could hopefully get the drug covered further remaining calendar year.  Arms that should be able to help her know if that is the case.  We do not have privy to billing information for her drugs.

## 2022-05-03 NOTE — Telephone Encounter (Signed)
Pt states her Ozempic  with insurance coverage that her co-pay $155, Pt would like a cheaper alternative

## 2022-05-08 ENCOUNTER — Ambulatory Visit (INDEPENDENT_AMBULATORY_CARE_PROVIDER_SITE_OTHER): Payer: Medicare Other | Admitting: Family Medicine

## 2022-05-08 ENCOUNTER — Encounter: Payer: Self-pay | Admitting: Family Medicine

## 2022-05-08 VITALS — BP 112/56 | HR 78 | Wt 136.0 lb

## 2022-05-08 DIAGNOSIS — E119 Type 2 diabetes mellitus without complications: Secondary | ICD-10-CM | POA: Diagnosis not present

## 2022-05-08 DIAGNOSIS — N1831 Chronic kidney disease, stage 3a: Secondary | ICD-10-CM

## 2022-05-08 DIAGNOSIS — R790 Abnormal level of blood mineral: Secondary | ICD-10-CM | POA: Diagnosis not present

## 2022-05-08 LAB — POCT GLYCOSYLATED HEMOGLOBIN (HGB A1C): HbA1c, POC (controlled diabetic range): 6.4 % (ref 0.0–7.0)

## 2022-05-08 NOTE — Patient Instructions (Signed)
Please start an over-the-counter magnesium supplement that should be easily found in the vitamin section at the store.  Just take 1 a day.  And when I see you back we will plan to recheck your levels.

## 2022-05-08 NOTE — Assessment & Plan Note (Signed)
Follow Q 6 months.    Lab Results  Component Value Date   CREATININE 1.01 (H) 04/11/2022   CREATININE 1.07 (H) 01/30/2022   CREATININE 1.10 (H) 08/01/2021

## 2022-05-08 NOTE — Progress Notes (Signed)
   Established Patient Office Visit  Subjective   Patient ID: Hannah Rojas, female    DOB: 04-Apr-1946  Age: 76 y.o. MRN: [id number]  Chief Complaint  Patient presents with   Diabetes    HPI   Diabetes - no hypoglycemic events. No wounds or sores that are not healing well. No increased thirst or urination. Checking glucose at home. Taking medications as prescribed without any side effects.  Did recently go to the emergency department on June 3 since I last saw her.  They ruled out acute MI.  She was noted to have low magnesium levels and she was encouraged to follow-up with cardiology. Also recently had Gastroenteritis. She is feeling much better.    F/U CKD - following  every 6 months.      ROS    Objective:     BP (!) 112/56   Pulse 78   Wt 136 lb (61.7 kg)   SpO2 98%   BMI 25.70 kg/m    Physical Exam Vitals and nursing note reviewed.  Constitutional:      Appearance: She is well-developed.  HENT:     Head: Normocephalic and atraumatic.  Cardiovascular:     Rate and Rhythm: Normal rate and regular rhythm.     Heart sounds: Normal heart sounds.  Pulmonary:     Effort: Pulmonary effort is normal.     Breath sounds: Normal breath sounds.  Skin:    General: Skin is warm and dry.  Neurological:     Mental Status: She is alert and oriented to person, place, and time.  Psychiatric:        Behavior: Behavior normal.      Results for orders placed or performed in visit on 05/08/22  POCT HgB A1C  Result Value Ref Range   Hemoglobin A1C     HbA1c POC (<> result, manual entry)     HbA1c, POC (prediabetic range)     HbA1c, POC (controlled diabetic range) 6.4 0.0 - 7.0 %      The 10-year ASCVD risk score (Arnett DK, et al., 2019) is: 28.7%    Assessment & Plan:   Problem List Items Addressed This Visit       Endocrine   Diabetes mellitus, type II ([place]) - Primary    A1c looks phenomenal today though she has been on the Ozempic for about 3 weeks.   She did bring back her patient assistance forms and will put those in Keisha's box today hopefully we can get it covered for her to be on the 2 mg dose.  Otherwise doing well.  Plan to follow-up in 3 to 4 months.      Relevant Orders   POCT HgB A1C (Completed)     Genitourinary   KIDNEY DISEASE, CHRONIC, STAGE III    Follow Q 6 months.    Lab Results  Component Value Date   CREATININE 1.01 (H) 04/11/2022   CREATININE 1.07 (H) 01/30/2022   CREATININE 1.10 (H) 08/01/2021         Other Visit Diagnoses     Low magnesium level          Please start an over-the-counter magnesium supplement that should be easily found in the vitamin section at the store.  Just take 1 a day.  And when I see you back we will plan to recheck your levels  Return in about 4 months (around 09/07/2022) for Diabetes follow-up.    Beatrice Lecher, MD

## 2022-05-08 NOTE — Assessment & Plan Note (Signed)
A1c looks phenomenal today though she has been on the Ozempic for about 3 weeks.  She did bring back her patient assistance forms and will put those in Keisha's box today hopefully we can get it covered for her to be on the 2 mg dose.  Otherwise doing well.  Plan to follow-up in 3 to 4 months.

## 2022-05-09 ENCOUNTER — Other Ambulatory Visit: Payer: Self-pay | Admitting: Family Medicine

## 2022-05-09 DIAGNOSIS — E119 Type 2 diabetes mellitus without complications: Secondary | ICD-10-CM

## 2022-05-09 NOTE — Telephone Encounter (Signed)
Hannah Rojas called back. Advised of recommendations.

## 2022-05-09 NOTE — Telephone Encounter (Signed)
LVM for pt to call to discuss.  T. Tao Satz, CMA  

## 2022-05-10 ENCOUNTER — Encounter: Payer: Self-pay | Admitting: General Practice

## 2022-05-10 ENCOUNTER — Ambulatory Visit: Payer: Medicare Other | Admitting: Pharmacist

## 2022-05-10 DIAGNOSIS — E1169 Type 2 diabetes mellitus with other specified complication: Secondary | ICD-10-CM

## 2022-05-10 DIAGNOSIS — E119 Type 2 diabetes mellitus without complications: Secondary | ICD-10-CM

## 2022-05-10 NOTE — Progress Notes (Signed)
Chronic Care Management Pharmacy Note  05/10/2022 Name:  Hannah Rojas MRN:  [id number] DOB:  02-18-1946  Summary: addressed HLD and primarily DM. Patient is on ozempic and metformin, but ran out of ozempic x 3 weeks due to unable to obtain 43m ozempic at pharmacy.  Received her paperwork for resubmitting novonordisk application.   Recommendations/Changes made from today's visit:   - Using medication sample at our office: restart ozempic at 0.271mweekly x2 weeks, if tolerating okay, may increase to 0.66m14meekly. Discussed with patient, also included these instructions on the sample box. - Patient assistance application processed, faxed, and is pending response from company.  Plan: f/u with pharmacist in 3-4 weeks  Subjective: Hannah Rojas an [age]o. year old female who is a primary patient of [name], Hannah Rojas.  The CCM team was consulted for assistance with disease management and care coordination needs.    Engaged with patient by telephone for follow up visit in response to provider referral for pharmacy case management and/or care coordination services.   Consent to Services:  The patient was given information about Chronic Care Management services, agreed to services, and gave verbal consent prior to initiation of services.  Please see initial visit note for detailed documentation.   Patient Care Team: [name] as PCP - [facility] Pharmacist (Pharmacist)   Objective:  Lab Results  Component Value Date   CREATININE 1.01 (H) 04/11/2022   CREATININE 1.07 (H) 01/30/2022   CREATININE 1.10 (H) 08/01/2021    Lab Results  Component Value Date   HGBA1C 6.4 05/08/2022   Last diabetic Eye exam:  Lab Results  Component Value Date/Time   HMDIABEYEEXA No Retinopathy 12/01/2019 12:00 AM    Last diabetic Foot exam: No results found for: "HMDIABFOOTEX"      Component Value Date/Time   CHOL 140 01/31/2021 0000    TRIG 133 01/31/2021 0000   HDL 65 01/31/2021 0000   CHOLHDL 2.2 01/31/2021 0000   VLDL 19 03/16/2016 0925   LDLCALC 54 01/31/2021 0000   LDLDIRECT 107 (H) 05/27/2014 1010       Latest Ref Rng & Units 01/31/2021   12:00 AM 12/22/2019    9:18 AM 02/12/2019    3:33 PM  Hepatic Function  Total Protein 6.1 - 8.1 g/dL 7.6  7.8  7.3   AST 10 - 35 U/L _0 ALT 6 - 29 U/L _1 Total Bilirubin 0.2 - 1.2 mg/dL 0.9  0.9  0.5     Lab Results  Component Value Date/Time   TSH 2.18 03/29/2020 08:44 AM   TSH 1.940 03/04/2012 10:30 AM       Latest Ref Rng & Units 04/11/2022   12:00 AM 01/31/2021   12:00 AM 03/29/2020    8:44 AM  CBC  WBC 3.8 - 10.8 Thousand/uL 12.9  10.5  10.9   Hemoglobin 11.7 - 15.5 g/dL 15.5  14.6  14.2   Hematocrit 35.0 - 45.0 % 45.8  43.8  42.1   Platelets 140 - 400 Thousand/uL 284  259  293     No results found for: "VD25OH"  Clinical ASCVD: No  The 10-year ASCVD risk score (Arnett DK, et al., 2019) is: 28.7%   Values used to calculate the score:     Age: [age]     Sex: Female     Is Non-Hispanic African American: No  Diabetic: Yes     Tobacco smoker: No     Systolic Blood Pressure: 701 mmHg     Is BP treated: Yes     HDL Cholesterol: 65 mg/dL     Total Cholesterol: 140 mg/dL     Social History   Tobacco Use  Smoking Status Never  Smokeless Tobacco Never   BP Readings from Last 3 Encounters:  05/08/22 (!) 112/56  04/17/22 107/63  04/11/22 138/83   Pulse Readings from Last 3 Encounters:  05/08/22 78  04/17/22 100  04/11/22 82   Wt Readings from Last 3 Encounters:  05/08/22 136 lb (61.7 kg)  04/17/22 138 lb (62.6 kg)  04/11/22 133 lb (60.3 kg)    Assessment: Review of patient past medical history, allergies, medications, health status, including review of consultants reports, laboratory and other test data, was performed as part of comprehensive evaluation and provision of chronic care management services.   SDOH:   (Social Determinants of Health) assessments and interventions performed:    CCM Care Plan  Allergies  Allergen Reactions   Ibuprofen Rash   Iodinated Contrast Media Rash    IV Contrast   Sulfa Antibiotics Rash   Atorvastatin Other (See Comments)    HA   Naproxen Sodium    Pravastatin Other (See Comments)    dizziness   Simvastatin Other (See Comments)    Nightmares   Sulfonamide Derivatives     REACTION: rash   Trazodone And Nefazodone Other (See Comments)    Nightmares    Xigduo Xr [Dapagliflozin-Metformin Hcl Er] Other (See Comments)    Excessive fatigue. Tolerates metformin alone.    Medications Reviewed Today     Reviewed by Hannah Marry, MD (Physician) on 05/08/22 at [place] List Status: <None>   Medication Order Taking? Sig Documenting Provider Last Dose Status Informant  Acetaminophen (TYLENOL 8 HOUR ARTHRITIS PAIN PO) [id number] Yes Take 1 tablet by mouth at bedtime as needed. [provider] Taking Active Self  blood glucose meter kit and supplies [id number] Yes Dispense based on patient and insurance preference. Use up to four times daily as directed. Dx:E11.9 Hannah Marry, MD Taking Active   escitalopram (LEXAPRO) 5 MG tablet [id number] Yes Take 1 tablet by mouth once daily Hannah Marry, MD Taking Active   glucose blood test strip [id number] Yes TO CHECK GLUCOSE UP TO 4 TIMES DAILY AS DIRECTED. Hannah Marry, MD Taking Active   lisinopril (ZESTRIL) 2.5 MG tablet [id number] Yes Take 1 tablet by mouth once daily Hannah Marry, MD Taking Active   melatonin 5 MG TABS [id number] Yes Take 5 mg by mouth at bedtime. [provider] Taking Active   metFORMIN (GLUCOPHAGE-XR) 500 MG 24 hr tablet [id number] Yes TAKE 2 TABLETS BY MOUTH ONCE DAILY WITH BREAKFAST Hannah Marry, MD Taking Active   ondansetron [facility]) 4 MG tablet [id number] Yes Take 1 tablet (4 mg total) by mouth every 8 (eight) hours as needed for  nausea or vomiting. Owens Loffler, DO Taking Active   ondansetron (ZOFRAN-ODT) 4 MG disintegrating tablet [id number] Yes Take by mouth. [provider] Taking Active   rosuvastatin (CRESTOR) 20 MG tablet [id number] Yes TAKE 1 TABLET BY MOUTH AT BEDTIME Hannah Marry, MD Taking Active   Semaglutide, 1 MG/DOSE, 4 MG/3ML SOPN [id number] Yes Inject 1 mg as directed once a week. Hannah Marry, MD Taking Active   Semaglutide, 2 MG/DOSE, (OZEMPIC, 2 MG/DOSE,) 8 MG/3ML SOPN [id number] Yes Inject 2  mg into the skin every 7 (seven) days. Hannah Marry, MD Taking Active   traMADol Veatrice Bourbon) 50 MG tablet [id number] Yes TAKE 1 TABLET BY MOUTH EVERY 8 HOURS AS NEEDED FOR  UP  TO  5  DAYS Hannah Marry, MD Taking Active             Patient Active Problem List   Diagnosis Date Noted   Stress and adjustment reaction 10/31/2021   Seasonal allergies 03/29/2020   History of iron deficiency 03/29/2020   Hyperlipidemia associated with type 2 diabetes mellitus ([place]) 03/13/2016   Obesity (BMI 30-39.9) 11/19/2013   History of splenectomy 01/06/2013   Oral lesion - Fibroma/Reactive Changes to tounge 08/07/2012   Pancreatic cyst 07/12/2011   Diabetes mellitus, type II ([place]) 05/17/2007   KIDNEY DISEASE, CHRONIC, STAGE III 05/17/2007   ABNORMAL SERUM ENZYME LEVEL NEC 05/16/2007   CARRIER, VIRAL HEPATITIS C 05/16/2007   SCOLIOSIS 06/26/2006    Immunization History  Administered Date(s) Administered   Fluad Quad(high Dose 65+) 06/23/2019, 08/01/2021   H1N1 07/17/2008   Influenza Split 06/10/2012   Influenza Whole 08/27/2007, 06/09/2008, 06/18/2009, 07/13/2009, 06/20/2010   Influenza, High Dose Seasonal PF 06/12/2017, 06/19/2018, 07/02/2020   Influenza,inj,Quad PF,6+ Mos 05/12/2013, 05/21/2014, 06/03/2015, 06/15/2016   Moderna Sars-Covid-2 Vaccination 12/26/2019, 01/17/2020, 08/16/2020, 03/15/2021   Pneumococcal Conjugate-13 12/02/2014   Pneumococcal Polysaccharide-23  08/27/2007, 11/20/2011   Td 06/19/2005   Tdap 01/25/2017   Zoster, Live 07/20/2010    Conditions to be addressed/monitored: HLD and DMII  There are no care plans that you recently modified to display for this patient.      Medication Assistance: Application for ozempic  medication assistance program. in process.  Anticipated assistance start date TBD.  See plan of care for additional detail.  Patient's preferred pharmacy is:  [facility] [address], [place] [address] Phone: [phone] Fax: [phone]   Uses pill box? Yes Pt endorses 100% compliance  Follow Up:  Patient agrees to Care Plan and Follow-up.  Plan: Telephone follow up appointment with care management team member scheduled for:  3-4 weeks  Larinda Buttery, PharmD Clinical Pharmacist [facility] [phone]

## 2022-05-10 NOTE — Patient Instructions (Signed)
Visit Information  Thank you for taking time to visit with me today. Please don't hesitate to contact me if I can be of assistance to you before our next scheduled telephone appointment.  Following are the goals we discussed today:   Patient Goals/Self-Care Activities Over the next 14 days, patient will:  take medications as prescribed and collaborate with provider on medication access solutions  Follow Up Plan: Telephone follow up appointment with care management team member scheduled for: 2-3 weeks  Please call the care guide team at [phone] if you need to cancel or reschedule your appointment.    Patient verbalizes understanding of instructions and care plan provided today and agrees to view in [place]. Active MyChart status and patient understanding of how to access instructions and care plan via MyChart confirmed with patient.     Hannah Rojas

## 2022-05-17 ENCOUNTER — Other Ambulatory Visit: Payer: Self-pay | Admitting: Family Medicine

## 2022-05-17 DIAGNOSIS — F4329 Adjustment disorder with other symptoms: Secondary | ICD-10-CM

## 2022-05-18 DIAGNOSIS — E1169 Type 2 diabetes mellitus with other specified complication: Secondary | ICD-10-CM

## 2022-05-18 DIAGNOSIS — Z7984 Long term (current) use of oral hypoglycemic drugs: Secondary | ICD-10-CM | POA: Diagnosis not present

## 2022-05-18 DIAGNOSIS — E785 Hyperlipidemia, unspecified: Secondary | ICD-10-CM

## 2022-05-18 DIAGNOSIS — Z7985 Long-term (current) use of injectable non-insulin antidiabetic drugs: Secondary | ICD-10-CM

## 2022-05-24 ENCOUNTER — Other Ambulatory Visit: Payer: Self-pay | Admitting: Family Medicine

## 2022-06-06 ENCOUNTER — Telehealth: Payer: Self-pay | Admitting: Family Medicine

## 2022-06-06 NOTE — Telephone Encounter (Signed)
Received 4 boxes of 1 mg Ozempic on 06/06/22 at 10:30 for patient. I have placed medication in fridge and called patient to let her know it is available for pickup - CF

## 2022-06-12 ENCOUNTER — Other Ambulatory Visit: Payer: Self-pay | Admitting: Family Medicine

## 2022-06-12 DIAGNOSIS — E119 Type 2 diabetes mellitus without complications: Secondary | ICD-10-CM

## 2022-06-16 NOTE — Telephone Encounter (Signed)
Patient picked up medication - CF 

## 2022-08-14 ENCOUNTER — Ambulatory Visit (INDEPENDENT_AMBULATORY_CARE_PROVIDER_SITE_OTHER): Payer: Medicare Other | Admitting: Family Medicine

## 2022-08-14 ENCOUNTER — Encounter: Payer: Self-pay | Admitting: Family Medicine

## 2022-08-14 VITALS — BP 123/63 | HR 75 | Ht 61.0 in | Wt 133.0 lb

## 2022-08-14 DIAGNOSIS — M25472 Effusion, left ankle: Secondary | ICD-10-CM

## 2022-08-14 DIAGNOSIS — R5383 Other fatigue: Secondary | ICD-10-CM

## 2022-08-14 DIAGNOSIS — E119 Type 2 diabetes mellitus without complications: Secondary | ICD-10-CM | POA: Diagnosis not present

## 2022-08-14 DIAGNOSIS — E785 Hyperlipidemia, unspecified: Secondary | ICD-10-CM

## 2022-08-14 DIAGNOSIS — Z23 Encounter for immunization: Secondary | ICD-10-CM

## 2022-08-14 DIAGNOSIS — E1169 Type 2 diabetes mellitus with other specified complication: Secondary | ICD-10-CM

## 2022-08-14 LAB — POCT GLYCOSYLATED HEMOGLOBIN (HGB A1C): Hemoglobin A1C: 6.7 % — AB (ref 4.0–5.6)

## 2022-08-14 NOTE — Assessment & Plan Note (Signed)
She has noticed about a 10-15 point bump in blood sugars while taking the statin.  So we discussed a couple of options 1 is to continue the statin because her A1c looks great and she is getting significant benefit.  #2, discussed decreasing her dose of the statin for at least a month to see if her blood sugars seem but well regulated.  Or even switching to a different statin that we did discuss that all have that potential side effect.

## 2022-08-14 NOTE — Patient Instructions (Addendum)
Ok to restart Crestor at half a tab and monitor sugars.   Then reschedule your December 27 appointment for end of February/early March.

## 2022-08-14 NOTE — Assessment & Plan Note (Addendum)
Well controlled. Continue current regimen. Follow up in  4 mo.   Foot exam done today.

## 2022-08-14 NOTE — Progress Notes (Signed)
Established Patient Office Visit  Subjective   Patient ID: Hannah Rojas, female    DOB: [birth year]  Age: 76 y.o. MRN: [id number]  Chief Complaint  Patient presents with   Diabetes    HPI  Hypertension- Pt denies chest pain, SOB, dizziness, or heart palpitations.  Taking meds as directed w/o problems.  Denies medication side effects.    Diabetes - no hypoglycemic events. No wounds or sores that are not healing well. No increased thirst or urination. Checking glucose at home. Taking medications as prescribed without any side effects.  Oertli on Ozempic and metformin.  She is up to 2 mg on the Ozempic.  Left ankle swelling has been going on for a while.  She denies any known injury or trauma.  She does not have any pain or discomfort with it.  It just seems to be unilateral and even coworkers have noticed it.  She says that she has felt a little bit more tired recently.  The only thing that seems to improve the swelling is if she wears stockings up to her knees she will notice the swelling is less.  Stopped her Crestor about a month ago bc she felt it was elevating her blood sugars.  She says she actually forgot to take the medication when she went to the beach with her daughter for a week.  And noticed that her blood sugars were running much better in the 90s fasting.  Then when she came home and restarted her statin they went back up into the 110 -115 range which is typically what she was saying.  Since then she has stopped it.    ROS    Objective:     BP 123/63   Pulse 75   Ht '5\' 1"'$  (1.549 m)   Wt 133 lb (60.3 kg)   SpO2 98%   BMI 25.13 kg/m    Physical Exam Vitals and nursing note reviewed.  Constitutional:      Appearance: She is well-developed.  HENT:     Head: Normocephalic and atraumatic.  Cardiovascular:     Rate and Rhythm: Normal rate and regular rhythm.     Heart sounds: Normal heart sounds.  Pulmonary:     Effort: Pulmonary effort is normal.      Breath sounds: Normal breath sounds.  Skin:    General: Skin is warm and dry.     Comments: Is definitely larger than the right it does appear to be more swollen but then there is no actual induration.  Neurological:     Mental Status: She is alert and oriented to person, place, and time.  Psychiatric:        Behavior: Behavior normal.      Results for orders placed or performed in visit on 08/14/22  POCT glycosylated hemoglobin (Hb A1C)  Result Value Ref Range   Hemoglobin A1C 6.7 (A) 4.0 - 5.6 %   HbA1c POC (<> result, manual entry)     HbA1c, POC (prediabetic range)     HbA1c, POC (controlled diabetic range)        The 10-year ASCVD risk score (Arnett DK, et al., 2019) is: 33.6%    Assessment & Plan:   Problem List Items Addressed This Visit       Endocrine   Hyperlipidemia associated with type 2 diabetes mellitus ([place])    She has noticed about a 10-15 point bump in blood sugars while taking the statin.  So we discussed a couple of  options 1 is to continue the statin because her A1c looks great and she is getting significant benefit.  #2, discussed decreasing her dose of the statin for at least a month to see if her blood sugars seem but well regulated.  Or even switching to a different statin that we did discuss that all have that potential side effect.      Relevant Orders   COMPLETE METABOLIC PANEL WITH GFR   CBC with Differential/Platelet   Diabetes mellitus, type II ([place]) - Primary    Well controlled. Continue current regimen. Follow up in  4 mo.   Foot exam done today.        Relevant Orders   POCT glycosylated hemoglobin (Hb A1C) (Completed)   COMPLETE METABOLIC PANEL WITH GFR   CBC with Differential/Platelet   Other Visit Diagnoses     Left ankle swelling       Relevant Orders   COMPLETE METABOLIC PANEL WITH GFR   CBC with Differential/Platelet   Other fatigue       Relevant Orders   COMPLETE METABOLIC PANEL WITH GFR   CBC with  Differential/Platelet   Need for pneumococcal 20-valent conjugate vaccination       Relevant Orders   Pneumococcal conjugate vaccine 20-valent (Prevnar 20) (Completed)   Need for immunization against influenza       Relevant Orders   Flu Vaccine QUAD High Dose(Fluad) (Completed)      Left ankle swelling with no pain or injury.  Unclear etiology. It is not pitting edema.  Recommend compression stocking during the day. If getting worse or new sxs pls let me know.    Fatigue - check CBC for anemia and CMP for renal and liver function.  Consider adding TSH if not improving.   Lab Results  Component Value Date   TSH 2.18 03/29/2020     Also needs an updated work note so that she can sit periodically while at work.  Return in about 3 months (around 11/14/2022) for Diabetes follow-up.     Beatrice Lecher, MD

## 2022-08-15 LAB — CBC WITH DIFFERENTIAL/PLATELET
Absolute Monocytes: 646 cells/uL (ref 200–950)
Basophils Absolute: 76 cells/uL (ref 0–200)
Basophils Relative: 0.8 %
Eosinophils Absolute: 599 cells/uL — ABNORMAL HIGH (ref 15–500)
Eosinophils Relative: 6.3 %
HCT: 40.7 % (ref 35.0–45.0)
Hemoglobin: 13.9 g/dL (ref 11.7–15.5)
Lymphs Abs: 5947 cells/uL — ABNORMAL HIGH (ref 850–3900)
MCH: 31.4 pg (ref 27.0–33.0)
MCHC: 34.2 g/dL (ref 32.0–36.0)
MCV: 91.9 fL (ref 80.0–100.0)
MPV: 11.7 fL (ref 7.5–12.5)
Monocytes Relative: 6.8 %
Neutro Abs: 2233 cells/uL (ref 1500–7800)
Neutrophils Relative %: 23.5 %
Platelets: 274 10*3/uL (ref 140–400)
RBC: 4.43 10*6/uL (ref 3.80–5.10)
RDW: 11.9 % (ref 11.0–15.0)
Total Lymphocyte: 62.6 %
WBC: 9.5 10*3/uL (ref 3.8–10.8)

## 2022-08-15 LAB — COMPLETE METABOLIC PANEL WITH GFR
AG Ratio: 1.3 (calc) (ref 1.0–2.5)
ALT: 12 U/L (ref 6–29)
AST: 18 U/L (ref 10–35)
Albumin: 4.3 g/dL (ref 3.6–5.1)
Alkaline phosphatase (APISO): 67 U/L (ref 37–153)
BUN: 22 mg/dL (ref 7–25)
CO2: 29 mmol/L (ref 20–32)
Calcium: 10 mg/dL (ref 8.6–10.4)
Chloride: 103 mmol/L (ref 98–110)
Creat: 0.88 mg/dL (ref 0.60–1.00)
Globulin: 3.4 g/dL (calc) (ref 1.9–3.7)
Glucose, Bld: 103 mg/dL — ABNORMAL HIGH (ref 65–99)
Potassium: 4.5 mmol/L (ref 3.5–5.3)
Sodium: 141 mmol/L (ref 135–146)
Total Bilirubin: 0.9 mg/dL (ref 0.2–1.2)
Total Protein: 7.7 g/dL (ref 6.1–8.1)
eGFR: 68 mL/min/{1.73_m2} (ref >=60)

## 2022-08-15 NOTE — Progress Notes (Signed)
Hi [name], kidney function looks much better this time you look much better hydrated which is fantastic.  Your sodium and chloride levels are also back to normal.  Blood count looks good as well.  I does know you had your last eye exam so that we can get a copy of that report.

## 2022-08-21 ENCOUNTER — Ambulatory Visit (INDEPENDENT_AMBULATORY_CARE_PROVIDER_SITE_OTHER): Payer: Medicare Other | Admitting: Family Medicine

## 2022-08-21 DIAGNOSIS — Z0000 Encounter for general adult medical examination without abnormal findings: Secondary | ICD-10-CM

## 2022-08-21 DIAGNOSIS — Z1211 Encounter for screening for malignant neoplasm of colon: Secondary | ICD-10-CM

## 2022-08-21 NOTE — Progress Notes (Signed)
MEDICARE ANNUAL WELLNESS VISIT  08/21/2022  Telephone Visit Disclaimer This Medicare AWV was conducted by telephone due to national recommendations for restrictions regarding the COVID-19 Pandemic (e.g. social distancing).  I verified, using two identifiers, that I am speaking with [name] or their authorized healthcare agent. I discussed the limitations, risks, security, and privacy concerns of performing an evaluation and management service by telephone and the potential availability of an in-person appointment in the future. The patient expressed understanding and agreed to proceed.  Location of Patient: Home Location of Provider (nurse):  In the office.  Subjective:    Hannah Rojas is a 76 y.o. female patient of [name], Rene Kocher, MD who had a Medicare Annual Wellness Visit today via telephone. [name] is Working full time and lives alone. she has 4 children. she reports that she is socially active and does interact with friends/family regularly. she is moderately physically active and enjoys playing with her granddaughter and work.  Patient Care Team: Hali Marry, MD as PCP - General Darius Bump, [facility] as Pharmacist (Pharmacist)     08/21/2022    1:38 PM 12/27/2020   10:13 AM 01/28/2015    3:21 PM  Advanced Directives  Does Patient Have a Medical Advance Directive? No No No  Would patient like information on creating a medical advance directive? No - Patient declined No - Patient declined Yes - Educational materials given    Hospital Utilization Over the Past 12 Months: # of hospitalizations or ER visits: 2 # of surgeries: 0  Review of Systems    Patient reports that her overall health is better compared to last year.  History obtained from chart review and the patient  Patient Reported Readings (BP, Pulse, CBG, Weight, etc) none  Pain Assessment Pain : 0-10 Pain Score: 3  Pain Type: Chronic pain Pain Location: Hip Pain  Orientation: Right, Left Pain Descriptors / Indicators: Aching Pain Onset: More than a month ago Pain Frequency: Intermittent Pain Relieving Factors: pain medication ointment  Pain Relieving Factors: pain medication ointment  Current Medications & Allergies (verified) Allergies as of 08/21/2022       Reactions   Ibuprofen Rash   Iodinated Contrast Media Rash   IV Contrast   Sulfa Antibiotics Rash   Atorvastatin Other (See Comments)   HA   Naproxen Sodium    Pravastatin Other (See Comments)   dizziness   Simvastatin Other (See Comments)   Nightmares   Sulfonamide Derivatives    REACTION: rash   Trazodone And Nefazodone Other (See Comments)   Nightmares   Xigduo Xr [dapagliflozin Pro-metformin Er] Other (See Comments)   Excessive fatigue. Tolerates metformin alone.        Medication List        Accurate as of August 21, 2022  1:49 PM. If you have any questions, ask your nurse or doctor.          blood glucose meter kit and supplies Dispense based on patient and insurance preference. Use up to four times daily as directed. Dx:E11.9   escitalopram 5 MG tablet Commonly known as: LEXAPRO Take 1 tablet by mouth once daily   lisinopril 2.5 MG tablet Commonly known as: ZESTRIL Take 1 tablet by mouth once daily   melatonin 5 MG Tabs Take 5 mg by mouth at bedtime.   metFORMIN 500 MG 24 hr tablet Commonly known as: GLUCOPHAGE-XR TAKE 2 TABLETS BY MOUTH ONCE DAILY WITH BREAKFAST   OneTouch Ultra test strip  Generic drug: glucose blood USE AS DIRECTED UP  TO  FOUR  TIMES  DAILY   Ozempic (2 MG/DOSE) 8 MG/3ML Sopn Generic drug: Semaglutide (2 MG/DOSE) Inject 2 mg into the skin every 7 (seven) days.   rosuvastatin 20 MG tablet Commonly known as: CRESTOR TAKE 1 TABLET BY MOUTH AT BEDTIME   traMADol 50 MG tablet Commonly known as: ULTRAM TAKE 1 TABLET BY MOUTH EVERY 8 HOURS AS NEEDED FOR  UP  TO  5  DAYS   TYLENOL 8 HOUR ARTHRITIS PAIN PO Take 1 tablet by  mouth at bedtime as needed.        History (reviewed): Past Medical History:  Diagnosis Date   Cataracts, bilateral    bilateral   H. pylori infection    history   Pancreatic cyst    hx mucinous cystoadenoma   Past Surgical History:  Procedure Laterality Date   CHOLECYSTECTOMY     ESOPHAGOGASTRODUODENOSCOPY     PANCREATIC CYST EXCISION     spleenectomy     History reviewed. No pertinent family history. Social History   Socioeconomic History   Marital status: Widowed    Spouse name: Not on file   Number of children: 4   Years of education: 8th grade   Highest education level: 8th grade  Occupational History   Occupation: Pension scheme manager: [id number]    Comment: Full time  Tobacco Use   Smoking status: Never   Smokeless tobacco: Never  Substance and Sexual Activity   Alcohol use: No   Drug use: Never   Sexual activity: Not on file  Other Topics Concern   Not on file  Social History Narrative   Lives alone. She takes care of grandchildren through out the week. She likes to go for walks in her free time.   Social Determinants of Health   Financial Resource Strain: Low Risk  (08/21/2022)   Overall Financial Resource Strain (CARDIA)    Difficulty of Paying Living Expenses: Not very hard  Food Insecurity: No Food Insecurity (08/21/2022)   Hunger Vital Sign    Worried About Running Out of Food in the Last Year: Never true    Ran Out of Food in the Last Year: Never true  Transportation Needs: No Transportation Needs (08/21/2022)   PRAPARE - Hydrologist (Medical): No    Lack of Transportation (Non-Medical): No  Physical Activity: Inactive (08/21/2022)   Exercise Vital Sign    Days of Exercise per Week: 0 days    Minutes of Exercise per Session: 0 min  Stress: No Stress Concern Present (08/21/2022)   [place]    Feeling of Stress : Not at all  Social Connections:  Moderately Isolated (08/21/2022)   Social Connection and Isolation Panel [NHANES]    Frequency of Communication with Friends and Family: More than three times a week    Frequency of Social Gatherings with Friends and Family: Twice a week    Attends Religious Services: More than 4 times per year    Active Member of [employer] or Organizations: No    Attends Archivist Meetings: Never    Marital Status: Widowed    Activities of Daily Living    08/21/2022    1:42 PM  In your present state of health, do you have any difficulty performing the following activities:  Hearing? 0  Vision? 0  Difficulty concentrating or making decisions? 0  Walking or climbing stairs? 1  Comment sometimes.  Dressing or bathing? 0  Doing errands, shopping? 0  Preparing Food and eating ? N  Using the Toilet? N  In the past six months, have you accidently leaked urine? N  Do you have problems with loss of bowel control? N  Managing your Medications? N  Managing your Finances? N  Housekeeping or managing your Housekeeping? N    Patient Education/ Literacy How often do you need to have someone help you when you read instructions, pamphlets, or other written materials from your doctor or pharmacy?: 3 - Sometimes What is the last grade level you completed in school?: 8th grade  Exercise Current Exercise Habits: The patient does not participate in regular exercise at present, Exercise limited by: orthopedic condition(s)  Diet Patient reports consuming 2 meals a day and 1 snack(s) a day Patient reports that her primary diet is: Regular Patient reports that she does have regular access to food.   Depression Screen    08/21/2022    1:39 PM 08/14/2022    9:01 AM 05/08/2022    8:02 AM 05/02/2021    7:48 AM 12/27/2020   10:14 AM 12/22/2019    8:48 AM 06/23/2019    9:19 AM  PHQ 2/9 Scores  PHQ - 2 Score 0 0 0 0 2 0 0  PHQ- 9 Score 0 2   3       Fall Risk    08/21/2022    1:39 PM 08/14/2022    9:01 AM  08/14/2022    8:06 AM 05/08/2022    8:02 AM 01/30/2022    7:38 AM  Fall Risk   Falls in the past year? 1 0 0 0 1  Number falls in past yr: 1 0 0 0 0  Injury with Fall? 1 0 0 0 1  Risk for fall due to : History of fall(s) No Fall Risks No Fall Risks No Fall Risks History of fall(s)  Follow up Falls evaluation completed;Education provided;Falls prevention discussed Falls evaluation completed Falls evaluation completed Falls evaluation completed Falls prevention discussed     Objective:  Collin Hendley Baune seemed alert and oriented and she participated appropriately during our telephone visit.  Blood Pressure Weight BMI  BP Readings from Last 3 Encounters:  08/14/22 123/63  05/08/22 (!) 112/56  04/17/22 107/63   Wt Readings from Last 3 Encounters:  08/14/22 133 lb (60.3 kg)  05/08/22 136 lb (61.7 kg)  04/17/22 138 lb (62.6 kg)   BMI Readings from Last 1 Encounters:  08/14/22 25.13 kg/m    *Unable to obtain current vital signs, weight, and BMI due to telephone visit type  Hearing/Vision  Rhilyn did not seem to have difficulty with hearing/understanding during the telephone conversation Reports that she has had a formal eye exam by an eye care professional within the past year Reports that she has not had a formal hearing evaluation within the past year *Unable to fully assess hearing and vision during telephone visit type  Cognitive Function:    08/21/2022    1:46 PM 12/27/2020   10:26 AM  6CIT Screen  What Year? 0 points 0 points  What month? 0 points 0 points  What time? 0 points 0 points  Count back from 20 0 points 2 points  Months in reverse 4 points 4 points  Repeat phrase 2 points 2 points  Total Score 6 points 8 points   (Normal:0-7, Significant for Dysfunction: >8)  Normal Cognitive Function  Screening: Yes   Immunization & Health Maintenance Record Immunization History  Administered Date(s) Administered   Fluad Quad(high Dose 65+) 06/23/2019, 08/01/2021,  08/14/2022   H1N1 07/17/2008   Influenza Split 06/10/2012   Influenza Whole 08/27/2007, 06/09/2008, 06/18/2009, 07/13/2009, 06/20/2010   Influenza, High Dose Seasonal PF 06/12/2017, 06/19/2018, 07/02/2020   Influenza,inj,Quad PF,6+ Mos 05/12/2013, 05/21/2014, 06/03/2015, 06/15/2016   Moderna Sars-Covid-2 Vaccination 12/26/2019, 01/17/2020, 08/16/2020, 03/15/2021   PNEUMOCOCCAL CONJUGATE-20 08/14/2022   Pneumococcal Conjugate-13 12/02/2014   Pneumococcal Polysaccharide-23 08/27/2007, 11/20/2011   Td 06/19/2005   Tdap 01/25/2017   Zoster, Live 07/20/2010    Health Maintenance  Topic Date Due   COVID-19 Vaccine (5 - 2023-24 season) 09/06/2022 (Originally 05/19/2022)   Zoster Vaccines- Shingrix (1 of 2) 11/20/2022 (Originally 09/04/1965)   COLONOSCOPY (Pts 45-43yr Insurance coverage will need to be confirmed)  08/22/2023 (Originally 05/08/2022)   OPHTHALMOLOGY EXAM  12/17/2022   Diabetic kidney evaluation - Urine ACR  01/31/2023   HEMOGLOBIN A1C  02/12/2023   Diabetic kidney evaluation - GFR measurement  08/15/2023   FOOT EXAM  08/15/2023   Medicare Annual Wellness (AWV)  08/22/2023   DEXA SCAN  03/23/2024   DTaP/Tdap/Td (3 - Td or Tdap) 01/26/2027   Pneumonia Vaccine 76 Years old  Completed   INFLUENZA VACCINE  Completed   Hepatitis C Screening  Completed   HPV VACCINES  Aged Out       Assessment  This is a routine wellness examination for [facility]  Health Maintenance: Due or Overdue There are no preventive care reminders to display for this patient.   Jaylan C Kernes does not need a referral for CCommercial Metals CompanyAssistance: Care Management:   no Social Work:    no Prescription Assistance:  no Nutrition/Diabetes Education:  no   Plan:  Personalized Goals  Goals Addressed               This Visit's Progress     Patient Stated (pt-stated)        08/21/2022 AWV Goal: Diabetes Management  Patient will maintain an A1C level below 7.0 Patient will not  develop any diabetic foot complications Patient will not experience any hypoglycemic episodes over the next 3 months Patient will notify our office of any CBG readings outside of the provider recommended range by calling [phone] will adhere to provider recommendations for diabetes management  Patient Self Management Activities take all medications as prescribed and report any negative side effects monitor and record blood sugar readings as directed adhere to a low carbohydrate diet that incorporates lean proteins, vegetables, whole grains, low glycemic fruits check feet daily noting any sores, cracks, injuries, or callous formations see PCP or podiatrist if she notices any changes in her legs, feet, or toenails Patient will visit PCP and have an A1C level checked every 3 to 6 months as directed  have a yearly eye exam to monitor for vascular changes associated with diabetes and will request that the report be sent to her pcp.  consult with her PCP regarding any changes in her health or new or worsening symptoms        Personalized Health Maintenance & Screening Recommendations  Colorectal cancer screening Shingles vaccine  Lung Cancer Screening Recommended: no (Low Dose CT Chest recommended if Age 76-80years, 30 pack-year currently smoking OR have quit w/in past 15 years) Hepatitis C Screening recommended: no HIV Screening recommended: no  Advanced Directives: Written information was not prepared per patient's request.  Referrals & Orders No  orders of the defined types were placed in this encounter.   Follow-up Plan Follow-up with Hali Marry, MD as planned Schedule shingles vaccine at the pharmacy. Medicare wellness visit in one year. Patient will get access mychart.   I have personally reviewed and noted the following in the patient's chart:   Medical and social history Use of alcohol, tobacco or illicit drugs  Current medications and  supplements Functional ability and status Nutritional status Physical activity Advanced directives List of other physicians Hospitalizations, surgeries, and ER visits in previous 12 months Vitals Screenings to include cognitive, depression, and falls Referrals and appointments  In addition, I have reviewed and discussed with Kirtland Bouchard Aybar certain preventive protocols, quality metrics, and best practice recommendations. A written personalized care plan for preventive services as well as general preventive health recommendations is available and can be mailed to the patient at her request.      Tinnie Gens, RN BSN  08/21/2022

## 2022-08-21 NOTE — Patient Instructions (Signed)
[place] Maintenance Summary and Written Plan of Care  Ms. Shifrin ,  Thank you for allowing me to perform your Medicare Annual Wellness Visit and for your ongoing commitment to your health.   Health Maintenance & Immunization History Health Maintenance  Topic Date Due   COVID-19 Vaccine (5 - 2023-24 season) 09/06/2022 (Originally 05/19/2022)   Zoster Vaccines- Shingrix (1 of 2) 11/20/2022 (Originally 09/04/1965)   COLONOSCOPY (Pts 45-73yr Insurance coverage will need to be confirmed)  08/22/2023 (Originally 05/08/2022)   OPHTHALMOLOGY EXAM  12/17/2022   Diabetic kidney evaluation - Urine ACR  01/31/2023   HEMOGLOBIN A1C  02/12/2023   Diabetic kidney evaluation - GFR measurement  08/15/2023   FOOT EXAM  08/15/2023   Medicare Annual Wellness (AWV)  08/22/2023   DEXA SCAN  03/23/2024   DTaP/Tdap/Td (3 - Td or Tdap) 01/26/2027   Pneumonia Vaccine 76 Years old  Completed   INFLUENZA VACCINE  Completed   Hepatitis C Screening  Completed   HPV VACCINES  Aged Out   Immunization History  Administered Date(s) Administered   Fluad Quad(high Dose 65+) 06/23/2019, 08/01/2021, 08/14/2022   H1N1 07/17/2008   Influenza Split 06/10/2012   Influenza Whole 08/27/2007, 06/09/2008, 06/18/2009, 07/13/2009, 06/20/2010   Influenza, High Dose Seasonal PF 06/12/2017, 06/19/2018, 07/02/2020   Influenza,inj,Quad PF,6+ Mos 05/12/2013, 05/21/2014, 06/03/2015, 06/15/2016   Moderna Sars-Covid-2 Vaccination 12/26/2019, 01/17/2020, 08/16/2020, 03/15/2021   PNEUMOCOCCAL CONJUGATE-20 08/14/2022   Pneumococcal Conjugate-13 12/02/2014   Pneumococcal Polysaccharide-23 08/27/2007, 11/20/2011   Td 06/19/2005   Tdap 01/25/2017   Zoster, Live 07/20/2010    These are the patient goals that we discussed:  Goals Addressed               This Visit's Progress     Patient Stated (pt-stated)        08/21/2022 AWV Goal: Diabetes Management  Patient will maintain an A1C level  below 7.0 Patient will not develop any diabetic foot complications Patient will not experience any hypoglycemic episodes over the next 3 months Patient will notify our office of any CBG readings outside of the provider recommended range by calling [phone] will adhere to provider recommendations for diabetes management  Patient Self Management Activities take all medications as prescribed and report any negative side effects monitor and record blood sugar readings as directed adhere to a low carbohydrate diet that incorporates lean proteins, vegetables, whole grains, low glycemic fruits check feet daily noting any sores, cracks, injuries, or callous formations see PCP or podiatrist if she notices any changes in her legs, feet, or toenails Patient will visit PCP and have an A1C level checked every 3 to 6 months as directed  have a yearly eye exam to monitor for vascular changes associated with diabetes and will request that the report be sent to her pcp.  consult with her PCP regarding any changes in her health or new or worsening symptoms          This is a list of Health Maintenance Items that are overdue or due now: Colorectal cancer screening Shingles vaccine    Orders/Referrals Placed Today: Orders Placed This Encounter  Procedures   Ambulatory referral to Gastroenterology (for Colonoscopy)    Referral Priority:   Routine    Referral Type:   Consultation    Referral Reason:   Specialty Services Required    Number of Visits Requested:   1   (Contact our referral department at [phone] you have not spoken with someone  about your referral appointment within the next 5 days)    Follow-up Plan Follow-up with Hali Marry, MD as planned Schedule shingles vaccine at the pharmacy. Medicare wellness visit in one year. Patient will get access mychart.      Health Maintenance, Female Adopting a healthy lifestyle and getting preventive care are  important in promoting health and wellness. Ask your health care provider about: The right schedule for you to have regular tests and exams. Things you can do on your own to prevent diseases and keep yourself healthy. What should I know about diet, weight, and exercise? Eat a healthy diet  Eat a diet that includes plenty of vegetables, fruits, low-fat dairy products, and lean protein. Do not eat a lot of foods that are high in solid fats, added sugars, or sodium. Maintain a healthy weight Body mass index (BMI) is used to identify weight problems. It estimates body fat based on height and weight. Your health care provider can help determine your BMI and help you achieve or maintain a healthy weight. Get regular exercise Get regular exercise. This is one of the most important things you can do for your health. Most adults should: Exercise for at least 150 minutes each week. The exercise should increase your heart rate and make you sweat (moderate-intensity exercise). Do strengthening exercises at least twice a week. This is in addition to the moderate-intensity exercise. Spend less time sitting. Even light physical activity can be beneficial. Watch cholesterol and blood lipids Have your blood tested for lipids and cholesterol at 76 years of age, then have this test every 5 years. Have your cholesterol levels checked more often if: Your lipid or cholesterol levels are high. You are older than 76 years of age. You are at high risk for heart disease. What should I know about cancer screening? Depending on your health history and family history, you may need to have cancer screening at various ages. This may include screening for: Breast cancer. Cervical cancer. Colorectal cancer. Skin cancer. Lung cancer. What should I know about heart disease, diabetes, and high blood pressure? Blood pressure and heart disease High blood pressure causes heart disease and increases the risk of stroke. This  is more likely to develop in people who have high blood pressure readings or are overweight. Have your blood pressure checked: Every 3-5 years if you are 4-58 years of age. Every year if you are 56 years old or older. Diabetes Have regular diabetes screenings. This checks your fasting blood sugar level. Have the screening done: Once every three years after age 26 if you are at a normal weight and have a low risk for diabetes. More often and at a younger age if you are overweight or have a high risk for diabetes. What should I know about preventing infection? Hepatitis B If you have a higher risk for hepatitis B, you should be screened for this virus. Talk with your health care provider to find out if you are at risk for hepatitis B infection. Hepatitis C Testing is recommended for: Everyone born from [birth year] through 1965. Anyone with known risk factors for hepatitis C. Sexually transmitted infections (STIs) Get screened for STIs, including gonorrhea and chlamydia, if: You are sexually active and are younger than 76 years of age. You are older than 76 years of age and your health care provider tells you that you are at risk for this type of infection. Your sexual activity has changed since you were last screened, and you  are at increased risk for chlamydia or gonorrhea. Ask your health care provider if you are at risk. Ask your health care provider about whether you are at high risk for HIV. Your health care provider may recommend a prescription medicine to help prevent HIV infection. If you choose to take medicine to prevent HIV, you should first get tested for HIV. You should then be tested every 3 months for as long as you are taking the medicine. Pregnancy If you are about to stop having your period (premenopausal) and you may become pregnant, seek counseling before you get pregnant. Take 400 to 800 micrograms (mcg) of folic acid every day if you become pregnant. Ask for birth control  (contraception) if you want to prevent pregnancy. Osteoporosis and menopause Osteoporosis is a disease in which the bones lose minerals and strength with aging. This can result in bone fractures. If you are 24 years old or older, or if you are at risk for osteoporosis and fractures, ask your health care provider if you should: Be screened for bone loss. Take a calcium or vitamin D supplement to lower your risk of fractures. Be given hormone replacement therapy (HRT) to treat symptoms of menopause. Follow these instructions at home: Alcohol use Do not drink alcohol if: Your health care provider tells you not to drink. You are pregnant, may be pregnant, or are planning to become pregnant. If you drink alcohol: Limit how much you have to: 0-1 drink a day. Know how much alcohol is in your drink. In the [place] one drink equals one 12 oz bottle of beer (355 mL), one 5 oz glass of wine (148 mL), or one 1 oz glass of hard liquor (44 mL). Lifestyle Do not use any products that contain nicotine or tobacco. These products include cigarettes, chewing tobacco, and vaping devices, such as e-cigarettes. If you need help quitting, ask your health care provider. Do not use street drugs. Do not share needles. Ask your health care provider for help if you need support or information about quitting drugs. General instructions Schedule regular health, dental, and eye exams. Stay current with your vaccines. Tell your health care provider if: You often feel depressed. You have ever been abused or do not feel safe at home. Summary Adopting a healthy lifestyle and getting preventive care are important in promoting health and wellness. Follow your health care provider's instructions about healthy diet, exercising, and getting tested or screened for diseases. Follow your health care provider's instructions on monitoring your cholesterol and blood pressure. This information is not intended to replace advice given  to you by your health care provider. Make sure you discuss any questions you have with your health care provider. Document Revised: 01/24/2021 Document Reviewed: 01/24/2021 Elsevier Patient Education  [place].

## 2022-08-23 IMAGING — DX DG LUMBAR SPINE COMPLETE 4+V
5 series · 5 of 5 positions shown · non-contrast
Comparison: CT of the abdomen and pelvis on 04/01/2012

CLINICAL DATA: Pain in the RIGHT SI joint. Back pain. RIGHT hip
pain. RIGHT leg numbness.

EXAM:
LUMBAR SPINE - COMPLETE 4+ VIEW

[l-spine ap]
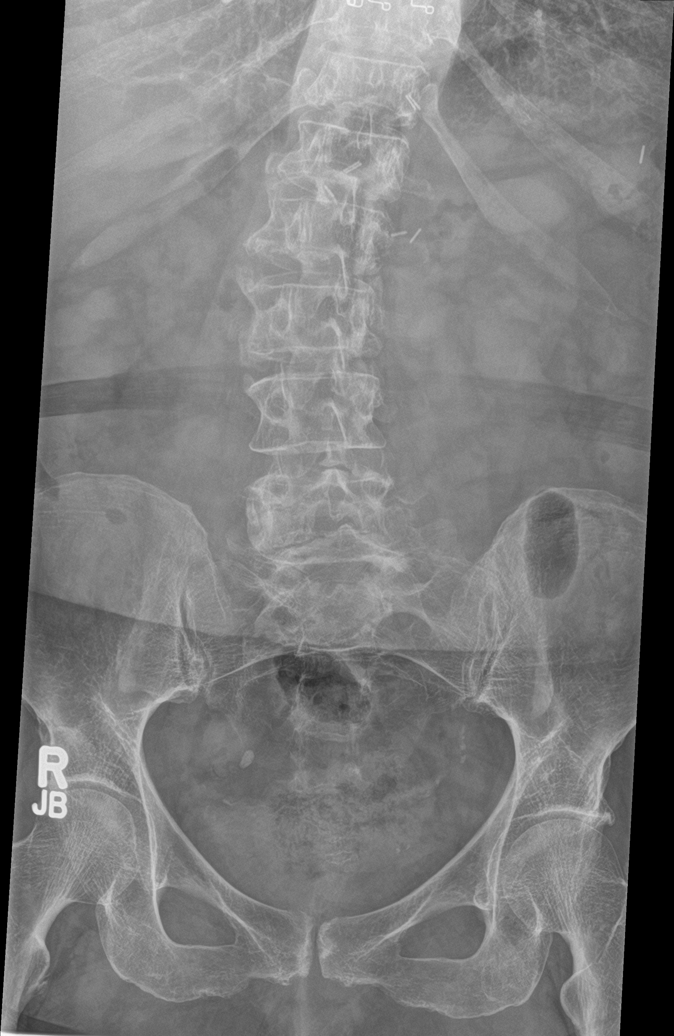

[l-spine obl (1 of 2)]
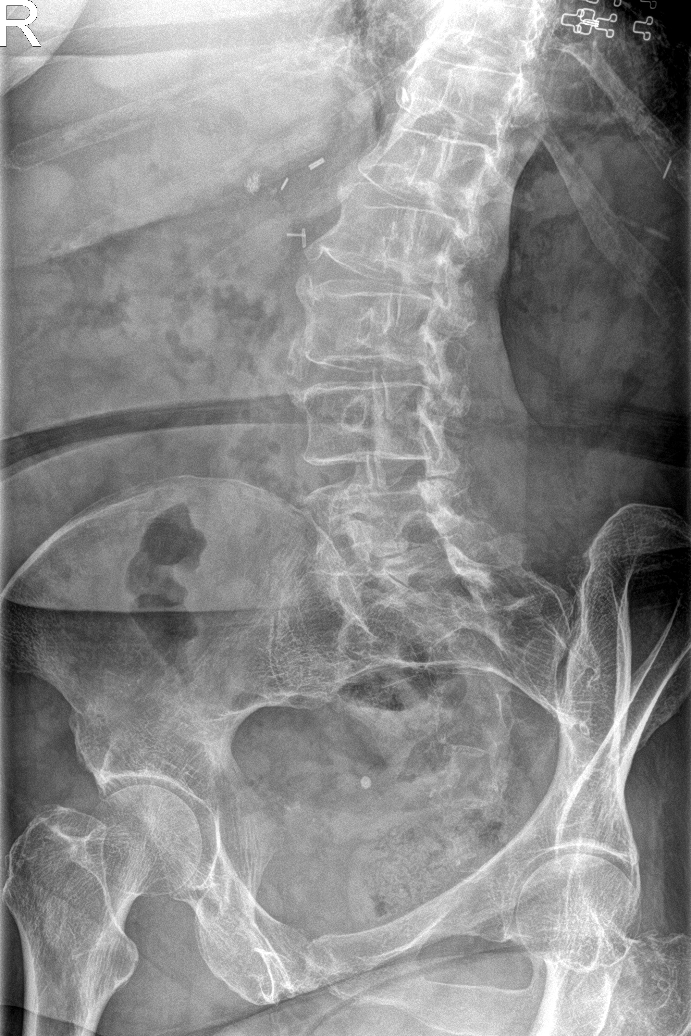

[l-spine obl (2 of 2)]
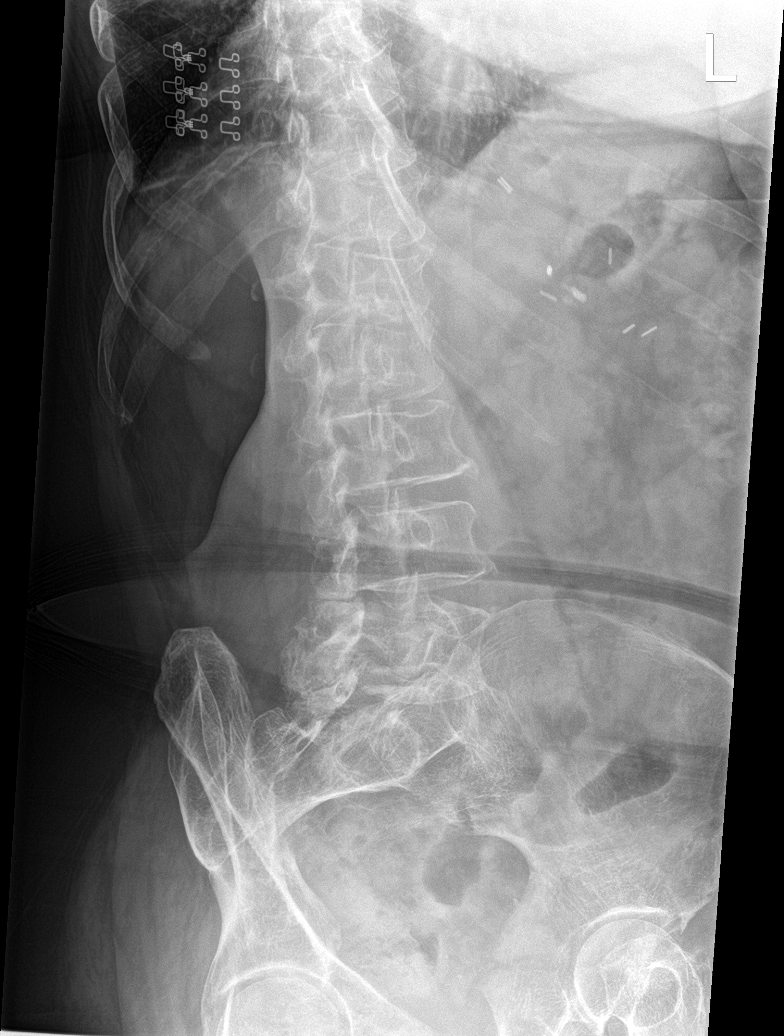

[l-spine lat]
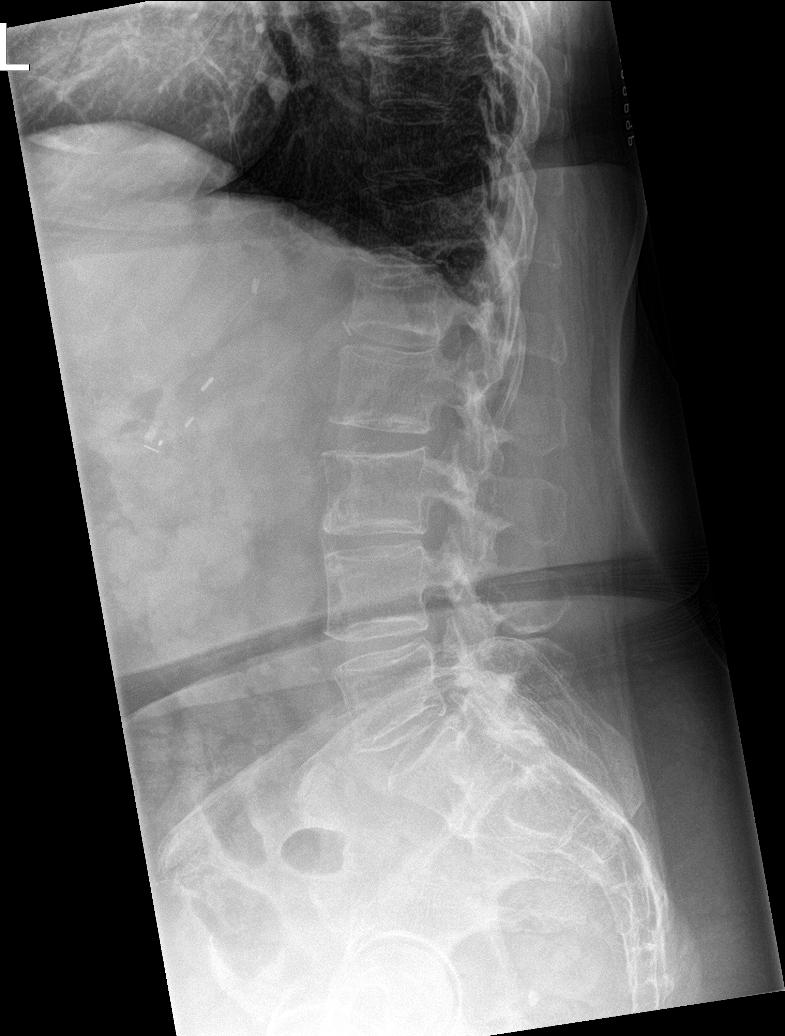

[l-spine spot]
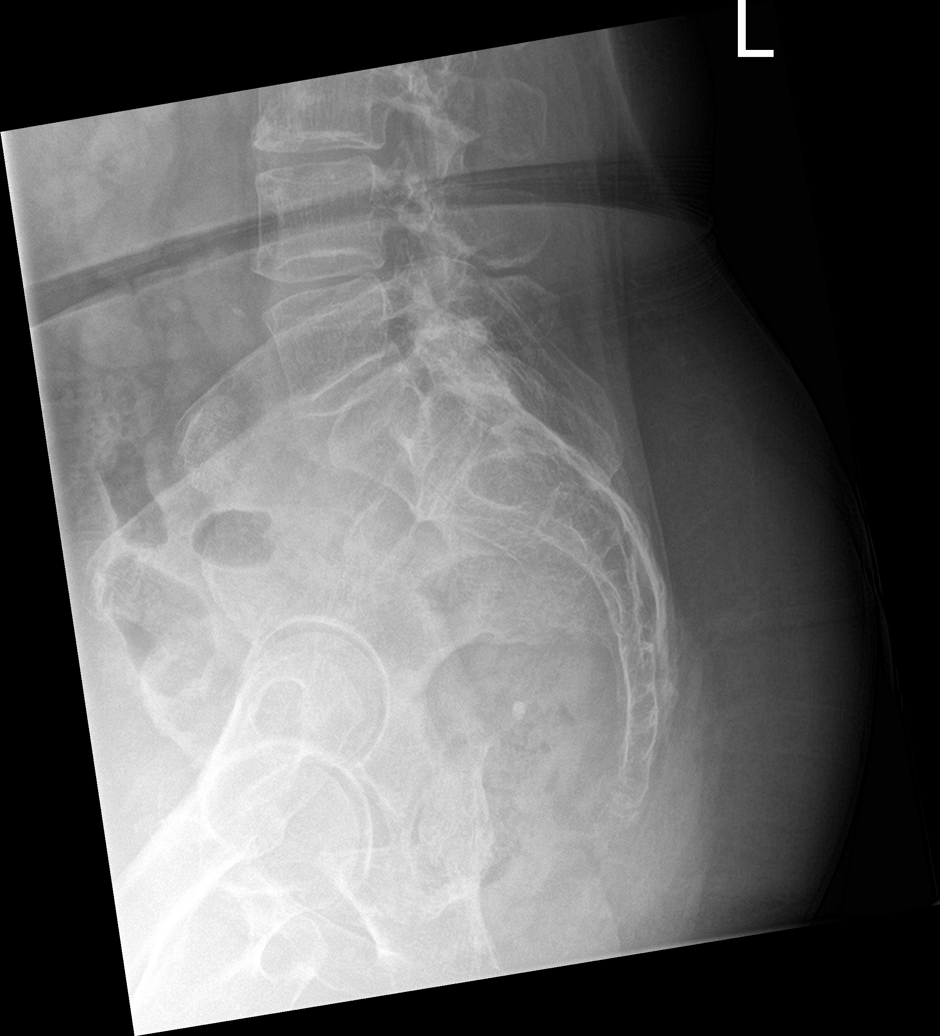

[5 of 5 positions shown; findings below may reference images not displayed]

FINDINGS: There is convex RIGHT scoliosis associated with degenerative
changes. There is chronic wedge deformity of L1. There is no acute
fracture or subluxation. Facet hypertrophy noted in the LOWER lumbar
levels. Disc height loss and uncovertebral spurring primarily at
L3-4, L4-5, and L5-S1. No lytic or blastic lesions.

Bowel gas pattern is nonobstructive. Surgical clips are identified
in the LEFT UPPER QUADRANT.
IMPRESSION: 1. Scoliosis and degenerative changes.
2. No evidence for acute abnormality.

## 2022-08-23 IMAGING — DX DG SACRUM/COCCYX 2+V
3 series · 3 of 3 positions shown · non-contrast
Comparison: CT 04/01/2012

CLINICAL DATA: RIGHT SI joint pain. LOWER back pain. RIGHT hip
pain.

EXAM:
SACRUM AND COCCYX - 2+ VIEW

[coccyx ap]
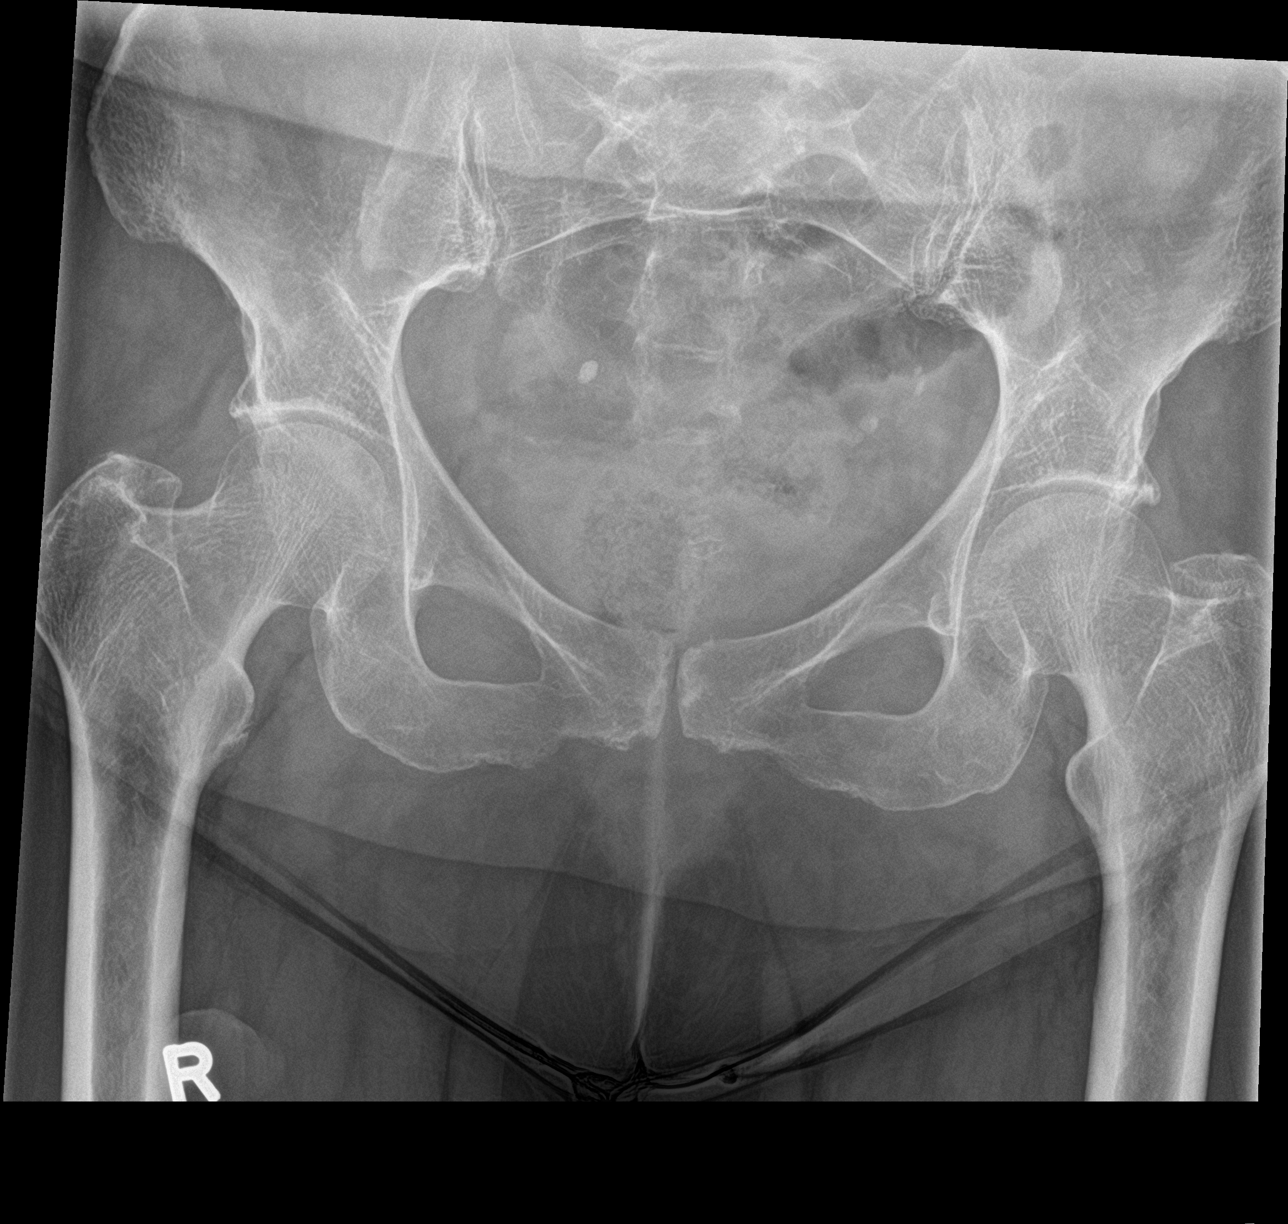

[sacrum ap]
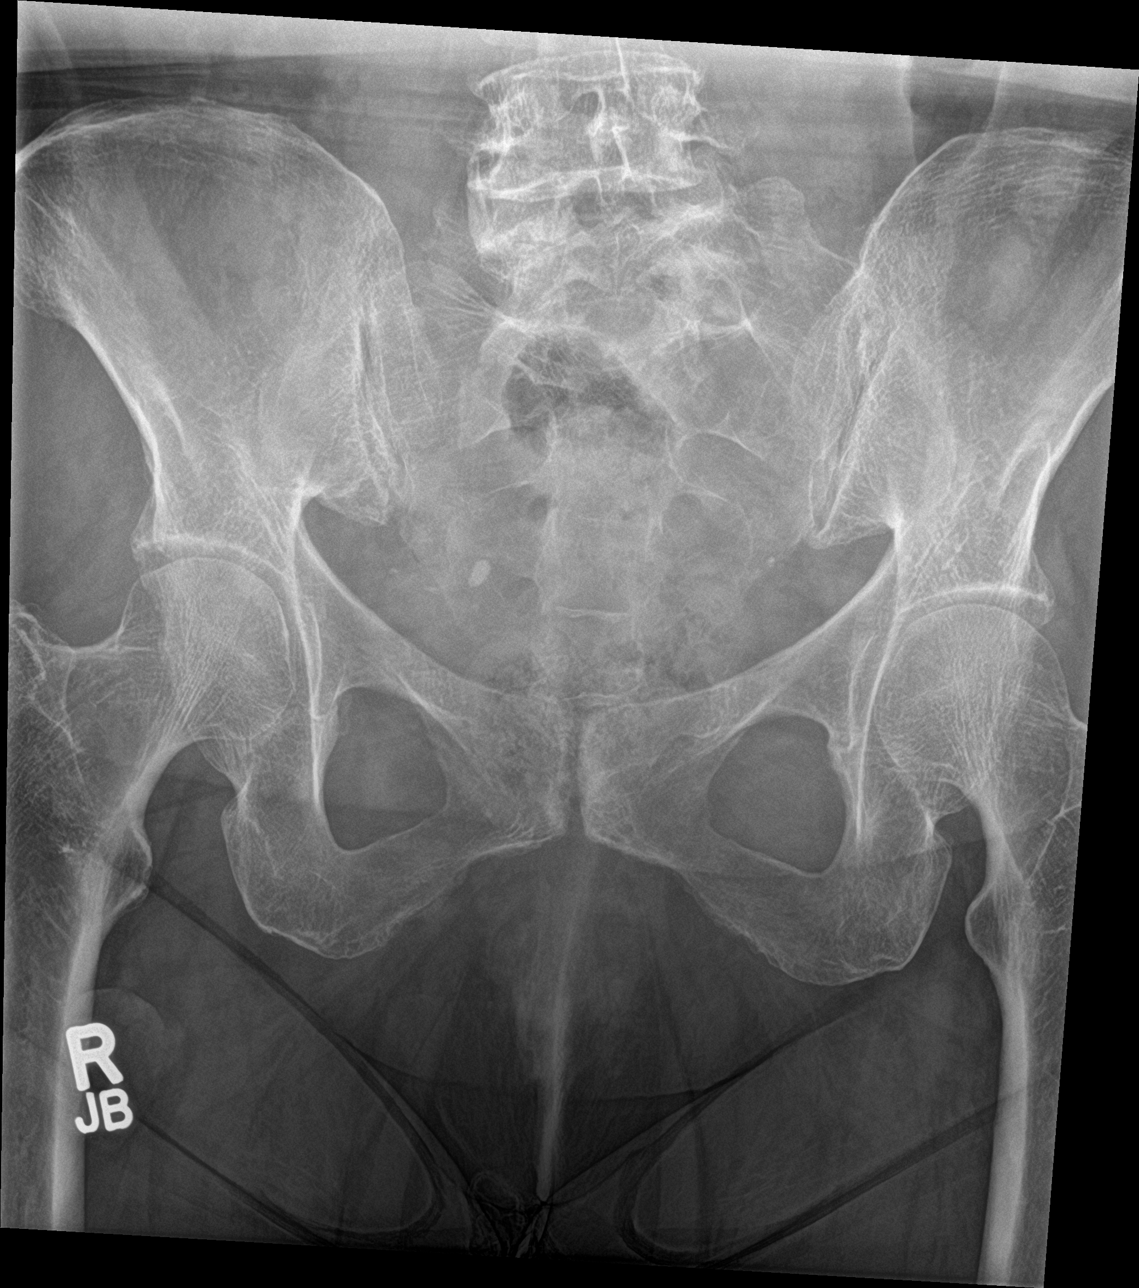

[sacrum lat]
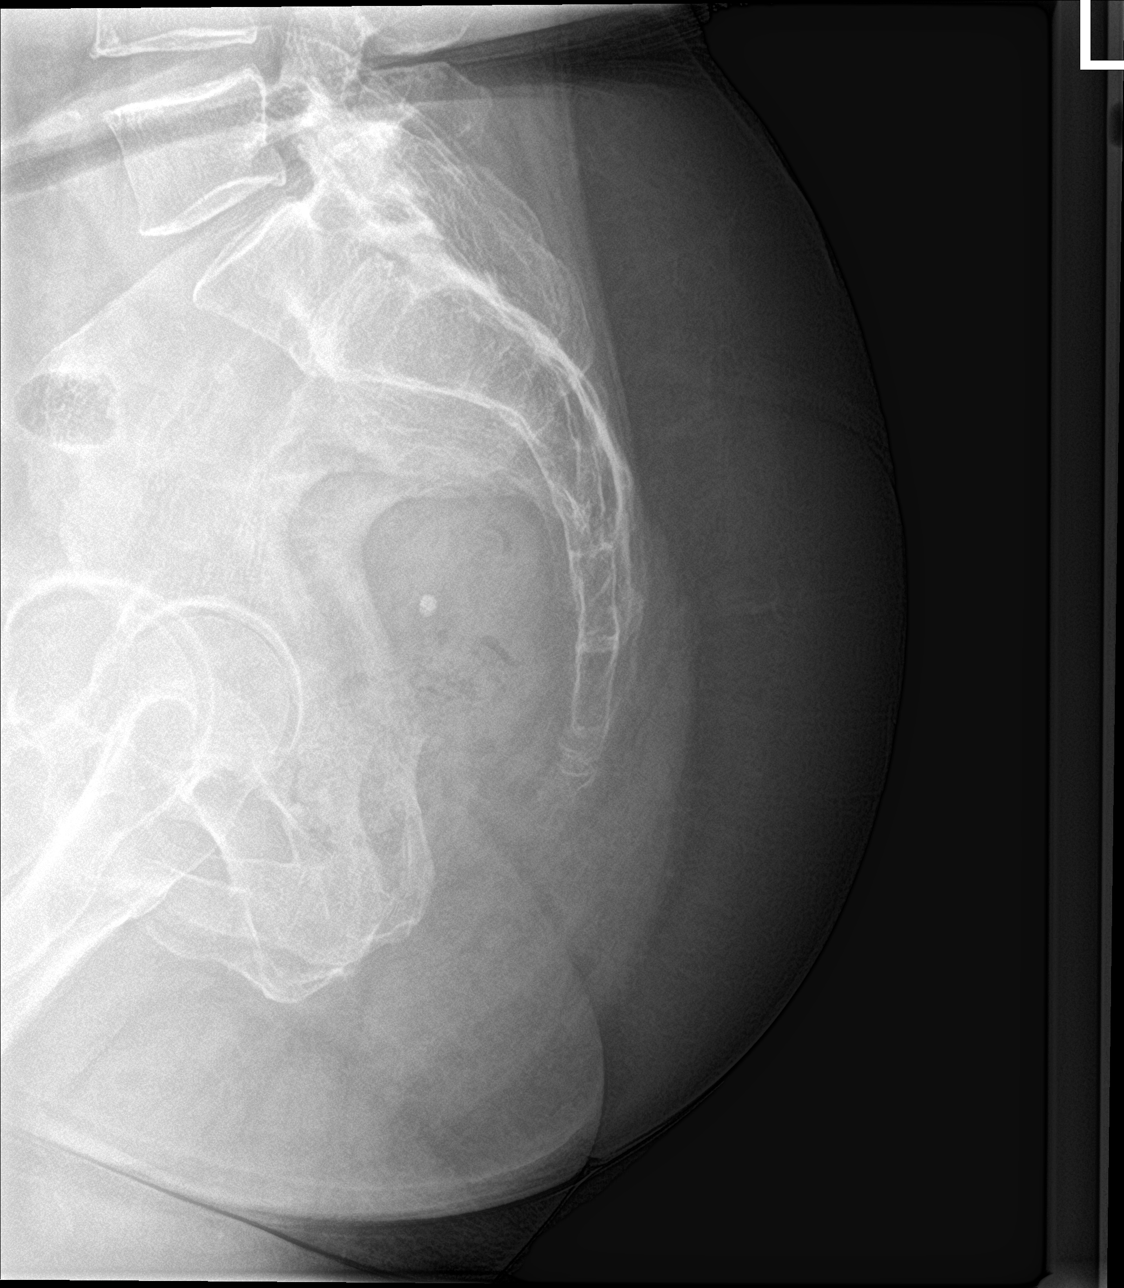

[3 of 3 positions shown; findings below may reference images not displayed]

FINDINGS: There is transitional type anatomy of L1. There is asymmetric
lucency of the appearance of the right LATERAL mass of L1. Although
this may be related to rotation and physiologic asymmetry, little
asymmetry was identified on previous CT exam. Consider further
evaluation CT exam. Otherwise the pelvis is unremarkable.
Phleboliths is identified in the RIGHT hemipelvis.
IMPRESSION: Asymmetric lucency of the right LATERAL mass of the transitional
vertebral body L1, raising the question of fracture or lytic lesion.
Consider further evaluation with CT exam.

These results will be called to the ordering clinician or
representative by the Radiologist Assistant, and communication
documented in the PACS or [REDACTED].

## 2022-08-24 ENCOUNTER — Other Ambulatory Visit: Payer: Self-pay | Admitting: Family Medicine

## 2022-08-24 DIAGNOSIS — E119 Type 2 diabetes mellitus without complications: Secondary | ICD-10-CM

## 2022-09-25 ENCOUNTER — Ambulatory Visit: Payer: Medicare Other | Admitting: Family Medicine

## 2022-10-04 ENCOUNTER — Telehealth: Payer: Self-pay

## 2022-10-04 NOTE — Telephone Encounter (Signed)
Patient is out of Ozempic and said that she usually gets it here at the office.

## 2022-10-06 NOTE — Telephone Encounter (Signed)
Pharmacy Documentation  Patient assistance for Ozempic via [employer].  Patient is running out of medication, currently on dose of '1mg'$  weekly. Should have received her renewal forms in the mail, will however facilitate a mailed copy for her to complete and return to centralized pharmacy services.  In meantime, patient can come to our office for a sample box of Ozempic 0.'25mg'$ /0.'5mg'$  sample, dial the pen to 0.'5mg'$  and do 2 of those back to back, for a dose of '1mg'$  weekly until we can obtain 2024 medication supply from manufacturer.  Contacted patient 10/06/22 to explain above plan, patient verbalized understanding.  Larinda Buttery, PharmD Clinical Pharmacist [facility] [phone]

## 2022-10-31 ENCOUNTER — Other Ambulatory Visit: Payer: Self-pay | Admitting: Family Medicine

## 2022-10-31 DIAGNOSIS — E119 Type 2 diabetes mellitus without complications: Secondary | ICD-10-CM

## 2022-11-08 ENCOUNTER — Other Ambulatory Visit: Payer: Self-pay | Admitting: Family Medicine

## 2022-11-09 ENCOUNTER — Other Ambulatory Visit: Payer: Self-pay | Admitting: Pharmacist

## 2022-11-09 NOTE — Progress Notes (Signed)
Care Coordination Call  Outreach to inquire about status of ozempic 2024 renewal.  Patient has not received application in mail yet, she is nearly out of sample medication.  Instructed patient to continue to get samples from PCP office if she runs out.   Will inquire with centralized rx med assistance team for update of application.  Larinda Buttery, PharmD Clinical Pharmacist [facility] [phone]

## 2022-11-15 ENCOUNTER — Other Ambulatory Visit (HOSPITAL_COMMUNITY): Payer: Self-pay

## 2022-11-20 ENCOUNTER — Ambulatory Visit (INDEPENDENT_AMBULATORY_CARE_PROVIDER_SITE_OTHER): Payer: Medicare Other | Admitting: Family Medicine

## 2022-11-20 ENCOUNTER — Telehealth: Payer: Self-pay

## 2022-11-20 VITALS — BP 136/73 | HR 79 | Ht 61.0 in | Wt 135.0 lb

## 2022-11-20 DIAGNOSIS — F4329 Adjustment disorder with other symptoms: Secondary | ICD-10-CM

## 2022-11-20 DIAGNOSIS — E119 Type 2 diabetes mellitus without complications: Secondary | ICD-10-CM | POA: Diagnosis not present

## 2022-11-20 DIAGNOSIS — H6123 Impacted cerumen, bilateral: Secondary | ICD-10-CM | POA: Diagnosis not present

## 2022-11-20 DIAGNOSIS — E785 Hyperlipidemia, unspecified: Secondary | ICD-10-CM | POA: Diagnosis not present

## 2022-11-20 DIAGNOSIS — N1831 Chronic kidney disease, stage 3a: Secondary | ICD-10-CM

## 2022-11-20 DIAGNOSIS — E1169 Type 2 diabetes mellitus with other specified complication: Secondary | ICD-10-CM | POA: Diagnosis not present

## 2022-11-20 LAB — POCT GLYCOSYLATED HEMOGLOBIN (HGB A1C): Hemoglobin A1C: 6.6 % — AB (ref 4.0–5.6)

## 2022-11-20 MED ORDER — METFORMIN HCL ER 500 MG PO TB24: 500.0000 mg | ORAL_TABLET | Freq: Every day | ORAL | 3 refills | Status: DC

## 2022-11-20 MED ORDER — ESCITALOPRAM OXALATE 5 MG PO TABS: 5.0000 mg | ORAL_TABLET | Freq: Every day | ORAL | 3 refills | Status: DC

## 2022-11-20 NOTE — Assessment & Plan Note (Signed)
To recheck renal function.  Labs ordered today.

## 2022-11-20 NOTE — Telephone Encounter (Signed)
Patient came into office to get 1 box of Ozempic, I logged it into the log book, thanks.

## 2022-11-20 NOTE — Assessment & Plan Note (Addendum)
Happy  with her current regimen of Lexapro.  Will continue with current regimen for now.  At goal in regards to symptom control.  [place] Office Visit from 11/20/2022 in [place] at [facility]  PHQ-9 Total Score 2

## 2022-11-20 NOTE — Progress Notes (Signed)
Established Patient Office Visit  Subjective   Patient ID: Hannah Rojas, female    DOB: 18-Sep-1946  Age: 77 y.o. MRN: [id number]  Chief Complaint  Patient presents with   Follow-up    3 month f/u diabetes      HPI Diabetes - no hypoglycemic events. No wounds or sores that are not healing well. No increased thirst or urination. Checking glucose at home. Taking medications as prescribed without any side effects.  Would like me look in her right ear today.  She says it feels clogged and if she presses on it she can get it to pop.  It is not painful.  She says she was also post to pick up a sample of Ozempic today if we have them.  Mood-she reports that she is doing well with her Lexapro.  No specific concerns or problems she is currently taking 5 mg daily.    ROS    Objective:     BP 136/73 (BP Location: Left Arm, Cuff Size: Normal)   Pulse 79   Ht '5\' 1"'$  (1.549 m)   Wt 135 lb (61.2 kg)   SpO2 100%   BMI 25.51 kg/m    Physical Exam Vitals and nursing note reviewed.  Constitutional:      Appearance: She is well-developed.  HENT:     Head: Normocephalic and atraumatic.     Right Ear: Ear canal and external ear normal.     Left Ear: Ear canal and external ear normal.     Ears:     Comments: Both TMs blocked by cerumen. Cardiovascular:     Rate and Rhythm: Normal rate and regular rhythm.     Heart sounds: Normal heart sounds.  Pulmonary:     Effort: Pulmonary effort is normal.     Breath sounds: Normal breath sounds.  Skin:    General: Skin is warm and dry.  Neurological:     Mental Status: She is alert and oriented to person, place, and time.  Psychiatric:        Behavior: Behavior normal.      Results for orders placed or performed in visit on 11/20/22  POCT glycosylated hemoglobin (Hb A1C)  Result Value Ref Range   Hemoglobin A1C 6.6 (A) 4.0 - 5.6 %   HbA1c POC (<> result, manual entry)     HbA1c, POC (prediabetic range)     HbA1c, POC  (controlled diabetic range)        The 10-year ASCVD risk score (Arnett DK, et al., 2019) is: 43.7%    Assessment & Plan:   Problem List Items Addressed This Visit       Endocrine   Hyperlipidemia associated with type 2 diabetes mellitus ([place])   Relevant Medications   metFORMIN (GLUCOPHAGE-XR) 500 MG 24 hr tablet   Other Relevant Orders   COMPLETE METABOLIC PANEL WITH GFR   Lipid Panel w/reflex Direct LDL   Diabetes mellitus, type II (HCC) - Primary    1C looks good today at 6.6.  Continue current regiment with Ozempic and metformin.  Did need refills on metformin today so those were sent.  Continue to work on [employer] and regular exercise.  Weight has been stable with a BMI of 25.  Lab Results  Component Value Date   HGBA1C 6.6 (A) 11/20/2022        Relevant Medications   metFORMIN (GLUCOPHAGE-XR) 500 MG 24 hr tablet   Other Relevant Orders   POCT glycosylated hemoglobin (  Hb A1C) (Completed)   COMPLETE METABOLIC PANEL WITH GFR   Lipid Panel w/reflex Direct LDL     Genitourinary   KIDNEY DISEASE, CHRONIC, STAGE III    To recheck renal function.  Labs ordered today.        Other   Stress and adjustment reaction    Happy  with her current regimen of Lexapro.  Will continue with current regimen for now.  At goal in regards to symptom control.  [place] Office Visit from 11/20/2022 in [place] at [facility]  PHQ-9 Total Score 2           Relevant Medications   escitalopram (LEXAPRO) 5 MG tablet   Other Visit Diagnoses     Bilateral impacted cerumen          Indication: Cerumen impaction of the ear(s) Medical necessity statement: On physical examination, cerumen impairs clinically significant portions of the external auditory canal, and tympanic membrane. Noted obstructive, copious cerumen that cannot be removed without magnification and instrumentations Consent: Discussed benefits and risks of  procedure and verbal consent obtained Procedure: Patient was prepped for the procedure. Utilized an otoscope to assess and take note of the ear canal, the tympanic membrane, and the presence, amount, and placement of the cerumen. Gentle water irrigation and soft plastic curette was utilized to remove cerumen.  Post procedure examination: shows cerumen was completely removed. Patient tolerated procedure well. The patient is made aware that they may experience temporary vertigo, temporary hearing loss, and temporary discomfort. If these symptom last for more than 24 hours to call the clinic or proceed to the ED.   Return in about 3 months (around 02/20/2023) for DM.    Beatrice Lecher, MD

## 2022-11-20 NOTE — Assessment & Plan Note (Signed)
1C looks good today at 6.6.  Continue current regiment with Ozempic and metformin.  Did need refills on metformin today so those were sent.  Continue to work on [employer] and regular exercise.  Weight has been stable with a BMI of 25.  Lab Results  Component Value Date   HGBA1C 6.6 (A) 11/20/2022

## 2022-11-21 LAB — COMPLETE METABOLIC PANEL WITH GFR
AG Ratio: 1.3 (calc) (ref 1.0–2.5)
ALT: 22 U/L (ref 6–29)
AST: 24 U/L (ref 10–35)
Albumin: 4.3 g/dL (ref 3.6–5.1)
Alkaline phosphatase (APISO): 77 U/L (ref 37–153)
BUN: 17 mg/dL (ref 7–25)
CO2: 28 mmol/L (ref 20–32)
Calcium: 9.6 mg/dL (ref 8.6–10.4)
Chloride: 105 mmol/L (ref 98–110)
Creat: 0.96 mg/dL (ref 0.60–1.00)
Globulin: 3.2 g/dL (calc) (ref 1.9–3.7)
Glucose, Bld: 108 mg/dL — ABNORMAL HIGH (ref 65–99)
Potassium: 5.2 mmol/L (ref 3.5–5.3)
Sodium: 142 mmol/L (ref 135–146)
Total Bilirubin: 0.5 mg/dL (ref 0.2–1.2)
Total Protein: 7.5 g/dL (ref 6.1–8.1)
eGFR: 61 mL/min/{1.73_m2} (ref >=60)

## 2022-11-21 LAB — LIPID PANEL W/REFLEX DIRECT LDL
Cholesterol: 145 mg/dL (ref <200)
HDL: 89 mg/dL (ref >=50)
LDL Cholesterol (Calc): 38 mg/dL (calc)
Non-HDL Cholesterol (Calc): 56 mg/dL (calc) (ref <130)
Total CHOL/HDL Ratio: 1.6 (calc) (ref <5.0)
Triglycerides: 97 mg/dL (ref <150)

## 2022-11-21 NOTE — Progress Notes (Signed)
Hi Tata, kidney function is stable at 0.9.  Liver function looks great as well.  Cholesterol looks fantastic!

## 2022-12-12 ENCOUNTER — Other Ambulatory Visit: Payer: Self-pay | Admitting: Pharmacist

## 2022-12-12 ENCOUNTER — Other Ambulatory Visit: Payer: Self-pay | Admitting: Family Medicine

## 2022-12-12 NOTE — Progress Notes (Signed)
Care Coordination Call  Contacted patient to obtain update with ozempic via [employer]isk patient assistance program application. Patient states she has not received anything in the mail.   Encouraged patient that labs from recent PCP visit appeared well controlled on a1c and cholesterol - and to keep utilizing samples of ozempic through our office until application is resolved.  Will forward to RX Med assistance team for status update.  Larinda Buttery, PharmD Clinical Pharmacist [facility] At [facility] [phone]

## 2022-12-13 ENCOUNTER — Other Ambulatory Visit (HOSPITAL_COMMUNITY): Payer: Self-pay

## 2022-12-13 ENCOUNTER — Telehealth: Payer: Self-pay

## 2022-12-13 NOTE — Telephone Encounter (Signed)
Submitted Fish farm manager

## 2022-12-13 NOTE — Telephone Encounter (Signed)
-----   Message from Darius Bump, [facility] sent at 12/12/2022  4:13 PM EDT ----- Hi,  Can we obtain a status update for ozempic patient assistance via novonordisk? Patient states she still has not received patient portion of her application to fill out, but Lillia Pauls noted it was sent to patient on 11/15/22.  Thanks, Lysle Morales

## 2023-01-01 DIAGNOSIS — E119 Type 2 diabetes mellitus without complications: Secondary | ICD-10-CM | POA: Diagnosis not present

## 2023-01-08 ENCOUNTER — Encounter: Payer: Self-pay | Admitting: Physician Assistant

## 2023-01-08 ENCOUNTER — Other Ambulatory Visit: Payer: Self-pay | Admitting: Physician Assistant

## 2023-01-08 ENCOUNTER — Ambulatory Visit (INDEPENDENT_AMBULATORY_CARE_PROVIDER_SITE_OTHER): Payer: Medicare HMO | Admitting: Physician Assistant

## 2023-01-08 ENCOUNTER — Ambulatory Visit (INDEPENDENT_AMBULATORY_CARE_PROVIDER_SITE_OTHER): Payer: Medicare HMO

## 2023-01-08 VITALS — BP 118/72 | HR 69 | Ht 61.0 in | Wt 136.0 lb

## 2023-01-08 DIAGNOSIS — W19XXXA Unspecified fall, initial encounter: Secondary | ICD-10-CM

## 2023-01-08 DIAGNOSIS — S4992XA Unspecified injury of left shoulder and upper arm, initial encounter: Secondary | ICD-10-CM | POA: Diagnosis not present

## 2023-01-08 DIAGNOSIS — R0789 Other chest pain: Secondary | ICD-10-CM | POA: Diagnosis not present

## 2023-01-08 DIAGNOSIS — M25512 Pain in left shoulder: Secondary | ICD-10-CM | POA: Diagnosis not present

## 2023-01-08 DIAGNOSIS — R0781 Pleurodynia: Secondary | ICD-10-CM

## 2023-01-08 DIAGNOSIS — M25522 Pain in left elbow: Secondary | ICD-10-CM

## 2023-01-08 MED ORDER — KETOROLAC TROMETHAMINE 30 MG/ML IJ SOLN
30.0000 mg | Freq: Once | INTRAMUSCULAR | Status: AC
Start: 2023-01-08 — End: 2023-01-13

## 2023-01-08 MED ORDER — TRAMADOL HCL 50 MG PO TABS: ORAL_TABLET | ORAL | 0 refills | Status: DC

## 2023-01-08 MED ORDER — KETOROLAC TROMETHAMINE 30 MG/ML IJ SOLN
30.0000 mg | Freq: Once | INTRAMUSCULAR | Status: DC
  Administered 2023-01-08: 30 mg via INTRAMUSCULAR

## 2023-01-08 NOTE — Progress Notes (Signed)
Acute Office Visit  Subjective:     Patient ID: Hannah Rojas, female    DOB: [birth year], 77 y.o.   MRN: [id number]  Chief Complaint  Patient presents with   Fall    HPI Patient is in today for follow up after a fall yesterday at church. She was walking up stairs in heels and slipped. She fell backwards down 6 steps on her back head first. EMS was called. After evaluation ok'ed her to follow up in outpatient office. She is sore on her left side of her body over her left shoulder, ribs, elbow. She denies any problems breathing. She has taken some aleve with no real benefit. No head injury.   .. 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   Obesity (BMI 30-39.9) 11/19/2013   Hyperlipidemia associated with type 2 diabetes mellitus 03/13/2016   Seasonal allergies 03/29/2020   History of iron deficiency 03/29/2020   Stress and adjustment reaction 10/31/2021   Chest pain 02/19/2022   Left elbow pain 01/08/2023   Rib pain on left side 01/08/2023   Injury of left shoulder 01/08/2023   Fall 01/08/2023   Resolved Ambulatory Problems    Diagnosis Date Noted   ABDOMINAL BLOATING 04/30/2007   Viral gastroenteritis 04/11/2022   Nausea and vomiting 04/11/2022   Dehydration 04/11/2022   H/O viral gastroenteritis 04/17/2022   Past Medical History:  Diagnosis Date   Cataracts, bilateral    H. pylori infection        ROS See HPI.      Objective:    BP 118/72 (BP Location: Left Arm, Patient Position: Sitting, Cuff Size: Normal)   Pulse 69   Ht  (1.549 m)   Wt 136 lb (61.7 kg)   SpO2 97%   BMI 25.70 kg/m  BP Readings from Last 3  Encounters:  01/08/23 118/72  11/20/22 136/73  08/14/22 123/63   Wt Readings from Last 3 Encounters:  01/08/23 136 lb (61.7 kg)  11/20/22 135 lb (61.2 kg)  08/14/22 133 lb (60.3 kg)      Physical Exam Constitutional:      Appearance: Normal appearance.  HENT:     Head: Normocephalic.  Cardiovascular:     Rate and Rhythm: Normal rate.  Pulmonary:     Effort: Pulmonary effort is normal.     Breath sounds: Normal breath sounds.  Musculoskeletal:     Comments: Bruising of right palm due to holding on the rail.  Left shoulder tenderness to palpation and pain with external ROM, bruising noted over [facility] joint.  Tenderness over left lower ribs to palpation, some bruising, no warmth.  Tenderness over left elbow to palpation, no swelling.   Neurological:     General: No focal deficit present.     Mental Status: She is alert and oriented to person, place, and time.  Psychiatric:        Mood and Affect: Mood normal.          Assessment & Plan:  Marland KitchenMarland KitchenOza was seen today for fall.  Diagnoses and all orders for this visit:  Fall, initial encounter -     DG Shoulder Left; Future -  Cancel: DG Ribs Unilateral Left; Future -     DG ELBOW COMPLETE LEFT (3+VIEW); Future -     traMADol (ULTRAM) 50 MG tablet; TAKE 1 TABLET BY MOUTH EVERY 8 HOURS AS NEEDED FOR  UP  TO  5  DAYS -     ketorolac (TORADOL) 30 MG/ML injection 30 mg  Injury of left shoulder, initial encounter -     DG Shoulder Left; Future -     traMADol (ULTRAM) 50 MG tablet; TAKE 1 TABLET BY MOUTH EVERY 8 HOURS AS NEEDED FOR  UP  TO  5  DAYS -     ketorolac (TORADOL) 30 MG/ML injection 30 mg  Rib pain on left side -     Cancel: DG Ribs Unilateral Left; Future -     traMADol (ULTRAM) 50 MG tablet; TAKE 1 TABLET BY MOUTH EVERY 8 HOURS AS NEEDED FOR  UP  TO  5  DAYS -     ketorolac (TORADOL) 30 MG/ML injection 30 mg  Left elbow pain -     DG ELBOW COMPLETE LEFT (3+VIEW); Future -     traMADol (ULTRAM) 50 MG tablet;  TAKE 1 TABLET BY MOUTH EVERY 8 HOURS AS NEEDED FOR  UP  TO  5  DAYS -     ketorolac (TORADOL) 30 MG/ML injection 30 mg   Pt has done well with tramadol in the past for pain and tolerated well Will get xrays today, stat, to rule out fracture.  Discussed if not fractured a contusion Toradol  IM given today Ok to take aleve tomorrow Tramadol for break through pain .Marland KitchenPDMP reviewed during this encounter. No concerns Encouraged icing and deep breathing to prevent any pneumonia Keep working on ROM of extremities  Follow up as imaging is needed.    Tandy Gaw, PA-C

## 2023-01-08 NOTE — Progress Notes (Signed)
No fracture of ribs.

## 2023-01-08 NOTE — Patient Instructions (Addendum)
Tramadol as needed for pain Ice frequently Ok to continue [employer] xrays Follow up as needed  Rib Contusion A rib contusion is a deep bruise on the rib area. Contusions are the result of a blunt trauma that causes bleeding and injury to the tissues under the skin. A rib contusion may involve bruising of the ribs and of the skin and muscles in the area. The skin over the contusion may turn blue, purple, or yellow. Minor injuries result in a painless contusion. More severe contusions may be painful and swollen for a few weeks. What are the causes? This condition is usually caused by a hard, direct hit to an area of the body. This often occurs while playing contact sports. What are the signs or symptoms? Symptoms of this condition include: Swelling and redness of the injured area. Discoloration of the injured area. Tenderness and soreness of the injured area. Pain with or without movement. Pain when breathing in. How is this diagnosed? This condition may be diagnosed based on: Your symptoms and medical history. A physical exam. Imaging tests--such as an X-ray, CT scan, or MRI--to determine if there were internal injuries or broken bones (fractures). How is this treated? This condition may be treated with: Rest. This is often the best treatment for a rib contusion. Ice packs. This reduces swelling and inflammation. Deep-breathing exercises. These may be recommended to reduce the risk for lung collapse and pneumonia. Medicines. Over-the-counter or prescription medicines may be given to control pain. Injection of a numbing medicine around the nerve near your injury (nerve block). Follow these instructions at home: Medicines Take over-the-counter and prescription medicines only as told by your health care provider. Ask your health care provider if the medicine prescribed to you: Requires you to avoid driving or using machinery. Can cause constipation. You may need to take these  actions to prevent or treat constipation: Drink enough fluid to keep your urine pale yellow. Take over-the-counter or prescription medicines. Eat foods that are high in fiber, such as beans, whole grains, and fresh fruits and vegetables. Limit foods that are high in fat and processed sugars, such as fried or sweet foods. Managing pain, stiffness, and swelling If directed, put ice on the injured area. To do this: Put ice in a plastic bag. Place a towel between your skin and the bag. Leave the ice on for 20 minutes, 2-3 times a day. Remove the ice if your skin turns bright red. This is very important. If you cannot feel pain, heat, or cold, you have a greater risk of damage to the area.  Activity Rest the injured area. Avoid strenuous activity and any activities or movements that cause pain. Be careful during activities, and avoid bumping the injured area. Do not lift anything that is heavier than 5 lb (2.3 kg), or the limit that you are told, until your health care provider says that it is safe. General instructions  Do not use any products that contain nicotine or tobacco, such as cigarettes, e-cigarettes, and chewing tobacco. These can delay healing. If you need help quitting, ask your health care provider. Do deep-breathing exercises as told by your health care provider. If you were given an incentive spirometer, use it every 1-2 hours while you are awake, or as recommended by your health care provider. This device measures how well you are filling your lungs with each breath. Keep all follow-up visits. This is important. Contact a health care provider if you have: Increased bruising or swelling.  Pain that is not controlled with treatment. A fever. Get help right away if you: Have difficulty breathing or shortness of breath. Develop a continual cough, or you cough up thick or bloody mucus from your lungs (sputum). Feel nauseous or you vomit. Have pain in your abdomen. These symptoms  may represent a serious problem that is an emergency. Do not wait to see if the symptoms will go away. Get medical help right away. Call your local emergency services (911 in the [place] Do not drive yourself to the hospital. Summary A rib contusion is a deep bruise on your rib area. Contusions are the result of a blunt trauma that causes bleeding and injury to the tissues under the skin. The skin over the contusion may turn blue, purple, or yellow. Minor injuries may cause a painless contusion. More severe contusions may be painful and swollen for a few weeks. Rest the injured area. Avoid strenuous activity and any activities or movements that cause pain. This information is not intended to replace advice given to you by your health care provider. Make sure you discuss any questions you have with your health care provider. Document Revised: 12/10/2019 Document Reviewed: 12/10/2019 Elsevier Patient Education  2023 [employer].

## 2023-01-08 NOTE — Progress Notes (Signed)
No fracture of left shoulder.

## 2023-01-08 NOTE — Progress Notes (Signed)
No fracture of elbow.

## 2023-01-15 ENCOUNTER — Encounter: Payer: Medicare HMO | Admitting: Sports Medicine

## 2023-01-16 NOTE — Progress Notes (Signed)
Care Coordination  Reviewing patient assistance - will seek status update with rx med assistance team.  Lynnda Shields, PharmD, BCPS Clinical Pharmacist [facility]

## 2023-01-17 NOTE — Telephone Encounter (Signed)
[employer], they are asking that I resend the provider portion due to a site error. I have faxed to [phone] ([employer] Urgent Fax Line)

## 2023-01-22 ENCOUNTER — Other Ambulatory Visit: Payer: Medicare HMO | Admitting: Pharmacist

## 2023-01-22 NOTE — Progress Notes (Signed)
01/22/2023 Name: Hannah Rojas MRN: [id number] DOB: [birth year]  No chief complaint on file.   Hannah Rojas is a 77 y.o. year old female who presented for a telephone visit.   They were referred to the pharmacist by  [facility] service  for assistance in managing diabetes and medication access.    Subjective:  Care Team: Primary Care Provider: Agapito Games, MD  Medication Access/Adherence  Current Pharmacy:  [facility] [address], [place] - [address] [place] [address] Phone: [phone] Fax: [phone]   Patient reports affordability concerns with their medications: No  Patient reports access/transportation concerns to their pharmacy: No  Patient reports adherence concerns with their medications:  No     Diabetes:  Current medications: ozempic 0.25mg  weekly  Medications tried in the past: insulin (able to come off after ozempic)  Current glucose readings: 125, 132, 129, 156, 141, 134, 164  Using traditional meter; testing 1 times daily   Patient denies hypoglycemic s/sx including dizziness, shakiness, sweating. Patient denies hyperglycemic symptoms including polyuria, polydipsia, polyphagia, nocturia, neuropathy, blurred vision.  Current meal patterns: stable nutrition  Current physical activity: works at [employer] part time, up and moving  Current medication access support: novonordisk for ozempic   Objective:  Lab Results  Component Value Date   HGBA1C 6.6 (A) 11/20/2022    Lab Results  Component Value Date   CREATININE 0.96 11/20/2022   BUN 17 11/20/2022   NA 142 11/20/2022   K 5.2 11/20/2022   CL 105 11/20/2022   CO2 28 11/20/2022    Lab Results  Component Value Date   CHOL 145 11/20/2022   HDL 89 11/20/2022   LDLCALC 38 11/20/2022   LDLDIRECT 107 (H) 05/27/2014   TRIG 97 11/20/2022   CHOLHDL 1.6 11/20/2022    Medications Reviewed Today     Reviewed by Jomarie Longs, PA-C (Physician Assistant) on 01/08/23 at 1341  Med List Status: <None>   Medication Order Taking? Sig Documenting Provider Last Dose Status Informant  Acetaminophen (TYLENOL 8 HOUR ARTHRITIS PAIN PO) [id number] Yes Take 1 tablet by mouth at bedtime as needed. [provider] Taking Active Self  blood glucose meter kit and supplies [id number] Yes Dispense based on patient and insurance preference. Use up to four times daily as directed. Dx:E11.9 Agapito Games, MD Taking Active   escitalopram (LEXAPRO) 5 MG tablet [id number] Yes Take 1 tablet (5 mg total) by mouth daily. Agapito Games, MD Taking Active   ketorolac (TORADOL) 30 MG/ML injection 30 mg [id number]   Jomarie Longs, PA-C  Active   lisinopril (ZESTRIL) 2.5 MG tablet [id number] Yes Take 1 tablet by mouth once daily Agapito Games, MD Taking Active   melatonin 5 MG TABS [id number] Yes Take 5 mg by mouth at bedtime. [provider] Taking Active   metFORMIN (GLUCOPHAGE-XR) 500 MG 24 hr tablet [id number] Yes Take 1 tablet (500 mg total) by mouth daily with breakfast. Agapito Games, MD Taking Active   [facility] ULTRA test strip [id number] Yes USE AS DIRECTED UP  TO  FOUR  TIMES  DAILY Agapito Games, MD Taking Active   rosuvastatin (CRESTOR) 20 MG tablet [id number] Yes TAKE 1 TABLET BY MOUTH AT BEDTIME Agapito Games, MD Taking Active   Semaglutide, 2 MG/DOSE, (OZEMPIC, 2 MG/DOSE,) 8 MG/3ML SOPN [id number] Yes Inject 2 mg into the skin every 7 (seven) days. Agapito Games, MD Taking Active  traMADol (ULTRAM) 50 MG tablet [id number] Yes TAKE 1 TABLET BY MOUTH EVERY 8 HOURS AS NEEDED FOR  UP  TO  5  DAYS Breeback, Jade L, PA-C Taking Active               Assessment/Plan:   Diabetes: - Currently controlled - Recommend to contnue current medications for now, goal is to increase ozempic 0.25mg  weekly to 0.5mg  weekly, and discontinue metformin at that time. - Recommend to  check glucose daily as is current routine - Meets financial criteria for ozempic patient assistance program through novonordisk. Patient is approved through 09/18/23 and program is on auto-refill.    Follow Up Plan: 2-4 weeks when medication shipment has arrived to offer guidance through dose increase/discontinue metformin  Lynnda Shields, PharmD, BCPS Clinical Pharmacist [facility]

## 2023-01-22 NOTE — Telephone Encounter (Signed)
Pt has been approved for pt assistance through [employer]oNord[employer] for Ozempic until 09/18/2023. Medication should be delivered to provider's office between 10 and 14 business days.

## 2023-02-01 ENCOUNTER — Telehealth: Payer: Self-pay

## 2023-02-01 NOTE — Telephone Encounter (Signed)
Forwarding to [place] as an FYI.  (Tonya B)  Novo Nordisk PAP shipment for Ozempic 8 mg dose (4 boxes) received this morning. Please contact the patient to come and pick up their order today. Placed in the fridge with patient identifier. Thanks in advance.   NDC: 0981-1914-78 LOT: [id number] EXP: 2025-07-18

## 2023-02-02 NOTE — Telephone Encounter (Signed)
Pt called about samples

## 2023-02-07 ENCOUNTER — Other Ambulatory Visit: Payer: Medicare HMO | Admitting: Pharmacist

## 2023-02-07 NOTE — Progress Notes (Signed)
02/07/2023 Name: Hannah Rojas MRN: [id number] DOB: [birth year]  Chief Complaint  Patient presents with   Diabetes   Medication Assistance    Hannah Rojas is a 77 y.o. year old female who presented for a telephone visit.   They were referred to the pharmacist by  [facility] service  for assistance in managing diabetes and medication access.    Subjective:  Care Team: Primary Care Provider: Agapito Games, MD  Medication Access/Adherence  Current Pharmacy:  [facility] [address], [place] - [address] [place] [address] Phone: [phone] Fax: [phone]  Patient reports affordability concerns with their medications: No  Patient reports access/transportation concerns to their pharmacy: No  Patient reports adherence concerns with their medications:  No     Diabetes:  Current medications: ozempic 0.25mg  weekly -> patient received medication shipment via novonordisk but it is 2mg  pen strength. This is too high of a dose for patient currently at 0.25mg  weekly dosing.  Also on metformin XR 500mg  daily. Medications tried in the past: insulin (able to come off after ozempic)  Current glucose readings: 125, 117, 106, 165 Using traditional meter; testing 1 times daily  Patient denies hypoglycemic s/sx including dizziness, shakiness, sweating. Patient denies hyperglycemic symptoms including polyuria, polydipsia, polyphagia, nocturia, neuropathy, blurred vision.  Current meal patterns: stable nutrition  Current physical activity: works at [employer] part time, up and moving  Current medication access support: novonordisk for ozempic   Objective:  Lab Results  Component Value Date   HGBA1C 6.6 (A) 11/20/2022    Lab Results  Component Value Date   CREATININE 0.96 11/20/2022   BUN 17 11/20/2022   NA 142 11/20/2022   K 5.2 11/20/2022   CL 105 11/20/2022   CO2 28 11/20/2022    Lab Results  Component  Value Date   CHOL 145 11/20/2022   HDL 89 11/20/2022   LDLCALC 38 11/20/2022   LDLDIRECT 107 (H) 05/27/2014   TRIG 97 11/20/2022   CHOLHDL 1.6 11/20/2022    Medications Reviewed Today     Reviewed by Gabriel Carina, RPH (Pharmacist) on 02/07/23 at 1548  Med List Status: <None>   Medication Order Taking? Sig Documenting Provider Last Dose Status Informant  Acetaminophen (TYLENOL 8 HOUR ARTHRITIS PAIN PO) [id number]  Take 1 tablet by mouth at bedtime as needed. [provider]  Active Self  blood glucose meter kit and supplies [id number]  Dispense based on patient and insurance preference. Use up to four times daily as directed. Dx:E11.9 Agapito Games, MD  Active   escitalopram (LEXAPRO) 5 MG tablet [id number]  Take 1 tablet (5 mg total) by mouth daily. Agapito Games, MD  Active   lisinopril (ZESTRIL) 2.5 MG tablet [id number]  Take 1 tablet by mouth once daily Agapito Games, MD  Active   melatonin 5 MG TABS [id number]  Take 5 mg by mouth at bedtime. [provider]  Active   metFORMIN (GLUCOPHAGE-XR) 500 MG 24 hr tablet [id number]  Take 1 tablet (500 mg total) by mouth daily with breakfast. Agapito Games, MD  Active   [facility] ULTRA test strip [id number]  USE AS DIRECTED UP  TO  FOUR  TIMES  DAILY Agapito Games, MD  Active   rosuvastatin (CRESTOR) 20 MG tablet [id number]  TAKE 1 TABLET BY MOUTH AT BEDTIME Agapito Games, MD  Active   Semaglutide,0.25 or 0.5MG /DOS, (OZEMPIC, 0.25 OR 0.5 MG/DOSE,) 2 MG/3ML SOPN  [id number]  Inject 0.25 mg into the skin once a week. [provider]  Active            Med Note [facility] Jan 22, 2023 10:47 AM) Obtained through patient assistance  traMADol (ULTRAM) 50 MG tablet [id number]  TAKE 1 TABLET BY MOUTH EVERY 8 HOURS AS NEEDED FOR  UP  TO  5  DAYS Breeback, Jade L, PA-C  Active               Assessment/Plan:   Diabetes: - Currently controlled - Recommend to increase  ozempic to 0.5mg  weekly starting Monday, 02/12/23, and may stop metformin at that time (patient desires to reduce pill burden). - Recommend to check glucose daily as is current routine - Meets financial criteria for ozempic patient assistance program through novonordisk. Patient is approved through 09/18/23 and program is on auto-refill.  - Will facilitate a dose change form through RX med assist team to obtain ozempic at 0.5mg  weekly dosing. Patient has received 2mg  weekly pen strength in error and needs proper pen strength. If patient runs out of medication during manufacturer dose change, recommend PCP office to provide sample box to avoid gaps in medication use.   Follow Up Plan: 2-4 weeks to assess ozempic dose increase/discontinue metformin  Lynnda Shields, PharmD, BCPS Clinical Pharmacist [facility]

## 2023-02-19 ENCOUNTER — Ambulatory Visit: Payer: Medicare HMO | Admitting: Family Medicine

## 2023-02-19 VITALS — BP 118/64 | HR 80 | Ht 61.0 in | Wt 147.0 lb

## 2023-02-19 DIAGNOSIS — Z794 Long term (current) use of insulin: Secondary | ICD-10-CM | POA: Diagnosis not present

## 2023-02-19 DIAGNOSIS — F4329 Adjustment disorder with other symptoms: Secondary | ICD-10-CM

## 2023-02-19 DIAGNOSIS — E785 Hyperlipidemia, unspecified: Secondary | ICD-10-CM | POA: Diagnosis not present

## 2023-02-19 DIAGNOSIS — E1169 Type 2 diabetes mellitus with other specified complication: Secondary | ICD-10-CM | POA: Diagnosis not present

## 2023-02-19 DIAGNOSIS — E119 Type 2 diabetes mellitus without complications: Secondary | ICD-10-CM

## 2023-02-19 LAB — POCT GLYCOSYLATED HEMOGLOBIN (HGB A1C): Hemoglobin A1C: 6.8 % — AB (ref 4.0–5.6)

## 2023-02-19 LAB — POCT UA - MICROALBUMIN
Creatinine, POC: 50 mg/dL
Microalbumin Ur, POC: 30 mg/L

## 2023-02-19 MED ORDER — ESCITALOPRAM OXALATE 10 MG PO TABS: 10.0000 mg | ORAL_TABLET | Freq: Every day | ORAL | 1 refills | Status: DC

## 2023-02-19 MED ORDER — ROSUVASTATIN CALCIUM 10 MG PO TABS: 10.0000 mg | ORAL_TABLET | Freq: Every day | ORAL | 1 refills | Status: DC

## 2023-02-19 NOTE — Assessment & Plan Note (Signed)
Please  try taking the rosuvastatin.  I did decrease her dose to 10 mg.

## 2023-02-19 NOTE — Patient Instructions (Signed)
After you have done a month of the 0.5 mg weekly injections the plan will be to go up to 1 mg. Please continue your metformin once a day for the next 3 weeks and then stop. Please  try taking the rosuvastatin.  I did decrease her dose to 10 mg. 

## 2023-02-19 NOTE — Assessment & Plan Note (Signed)
After you have done a month of the 0.5 mg weekly injections the plan will be to go up to 1 mg. Please continue your metformin once a day for the next 3 weeks and then stop. Please  try taking the rosuvastatin.  I did decrease her dose to 10 mg.

## 2023-02-19 NOTE — Progress Notes (Unsigned)
Established Patient Office Visit  Subjective   Patient ID: Hannah Rojas, female    DOB: [birth year]  Age: 77 y.o. MRN: [id number]  Chief Complaint  Patient presents with   Diabetes    Eye exam done:   Dr. Verita Schneiders optometrist  Ph:  [phone]     HPI Diabetes - no hypoglycemic events. No wounds or sores that are not healing well. No increased thirst or urination. Checking glucose at home. Taking medications as prescribed without any side effects.  She started the Ozempic to 0.5 mg a week ago she is due for her second injections today.  She stopped her metformin about a week ago.  Not currently exercising but says she plans to do some more walking this summer.  No recent chest pain or shortness of breath.  So wanted to know if we could bump up the citalopram to 10 mg.  Also let me know that she quit taking her Crestor completely.  When she quit taking it so she said she felt like she had more energy.  {History (Optional):23778}  ROS    Objective:     There were no vitals taken for this visit. {Vitals History (Optional):23777}  Physical Exam Vitals and nursing note reviewed.  Constitutional:      Appearance: She is well-developed.  HENT:     Head: Normocephalic and atraumatic.  Cardiovascular:     Rate and Rhythm: Normal rate and regular rhythm.     Heart sounds: Normal heart sounds.  Pulmonary:     Effort: Pulmonary effort is normal.     Breath sounds: Normal breath sounds.  Skin:    General: Skin is warm and dry.  Neurological:     Mental Status: She is alert and oriented to person, place, and time.  Psychiatric:        Behavior: Behavior normal.      Results for orders placed or performed in visit on 02/19/23  POCT glycosylated hemoglobin (Hb A1C)  Result Value Ref Range   Hemoglobin A1C 6.8 (A) 4.0 - 5.6 %   HbA1c POC (<> result, manual entry)     HbA1c, POC (prediabetic range)     HbA1c, POC (controlled diabetic range)    POCT UA -  Microalbumin  Result Value Ref Range   Microalbumin Ur, POC 30 mg/L   Creatinine, POC 50 mg/dL   Albumin/Creatinine Ratio, Urine, POC 30-300     {Labs (Optional):23779}  The 10-year ASCVD risk score (Arnett DK, et al., 2019) is: 35.6%    Assessment & Plan:   Problem List Items Addressed This Visit       Endocrine   Hyperlipidemia associated with type 2 diabetes mellitus (HCC)    Please  try taking the rosuvastatin.  I did decrease her dose to 10 mg.      Relevant Medications   rosuvastatin (CRESTOR) 10 MG tablet   Diabetes mellitus, type II (HCC) - Primary    After you have done a month of the 0.5 mg weekly injections the plan will be to go up to 1 mg. Please continue your metformin once a day for the next 3 weeks and then stop. Please  try taking the rosuvastatin.  I did decrease her dose to 10 mg.      Relevant Medications   rosuvastatin (CRESTOR) 10 MG tablet   Other Relevant Orders   POCT glycosylated hemoglobin (Hb A1C) (Completed)   POCT UA - Microalbumin (Completed)     Other  Stress and adjustment reaction   Relevant Medications   escitalopram (LEXAPRO) 10 MG tablet    After you have done a month of the 0.5 mg weekly injections the plan will be to go up to 1 mg.  Please continue your metformin once a day for the next 3 weeks and then stop.   Please  try taking the rosuvastatin.  I did decrease her dose to 10 mg.  Return in about 3 months (around 05/22/2023) for Diabetes follow-up.    Nani Gasser, MD

## 2023-02-20 NOTE — Assessment & Plan Note (Signed)
Will bump the lexapro to 10mg .  Call if any problems.

## 2023-02-20 NOTE — Progress Notes (Signed)
Continue low-dose ACE inhibitor 

## 2023-02-27 ENCOUNTER — Telehealth: Payer: Self-pay

## 2023-02-27 NOTE — Telephone Encounter (Signed)
Forwarding to Keesha & CMA as an FYI.  Novo Nordisk PAP shipment for Ozempic 0.25/0.5 MG dose (5 boxes) received this morning. Contacted the patient to come and pick up their order today. Placed in the fridge with patient identifier. Thanks in advance.   NDC: 0169-4181-13 LOT: [id number] EXP: 2024-07-18 

## 2023-03-01 ENCOUNTER — Other Ambulatory Visit: Payer: Self-pay | Admitting: Family Medicine

## 2023-03-02 NOTE — Telephone Encounter (Signed)
Advised patient that her Ozempic is here for her to pick up at her earliest convenience.

## 2023-03-05 NOTE — Progress Notes (Signed)
Pt was seen in office on 6/3:  Diabetes - no hypoglycemic events. No wounds or sores that are not healing well. No increased thirst or urination. Checking glucose at home. Taking medications as prescribed without any side effects.  She started the Ozempic to 0.5 mg a week ago she is due for her second injections today.  She stopped her metformin about a week ago.

## 2023-03-09 ENCOUNTER — Telehealth: Payer: Self-pay

## 2023-03-09 NOTE — Progress Notes (Signed)
   03/09/2023  Patient ID: Hannah Rojas, female   DOB: 18-Oct-1945, 77 y.o.   MRN: [id number]  Patient outreach to check on tolerance/efficacy of Ozempic.  Unable to contact but left HIPAA compliant voicemail with my direct phone number to call at her convenience.  Lenna Gilford, PharmD, DPLA

## 2023-03-14 ENCOUNTER — Telehealth: Payer: Self-pay

## 2023-03-14 NOTE — Progress Notes (Signed)
   03/14/2023  Patient ID: Hannah Rojas, female   DOB: Jun 02, 1946, 77 y.o.   MRN: [id number]  Patient outreach to check on tolerance/efficacy of Ozempic 0.5mg  weekly  -Taking 0.5mg  weekly of Ozempic and has been taking for about 1 month now -Patient states she is also taking metformin XR 500mg  2 every day; it appears Dr. Linford Arnold wanted her to continue for 3 weeks after her 6/3 appointment with her -Ms. Beagle states BG seems to be running higher since being back on metformin; and she had GI side effects from the medication, but this has subsided -Telephone visit scheduled for 7/9 to check on home BG and discuss increasing increasing Ozempic to 1mg  weekly (will need to complete order change form-patient can do two 0.5mg  injections in the meantime) and decreasing/stopping metformin  Lenna Gilford, PharmD, DPLA

## 2023-03-15 NOTE — Telephone Encounter (Signed)
Ok for letter

## 2023-03-15 NOTE — Telephone Encounter (Signed)
Patient is requesting a letter to state she is ok to move to a first floor apt because she is unable to use stairs due to recent falls

## 2023-03-19 ENCOUNTER — Encounter: Payer: Self-pay | Admitting: Family Medicine

## 2023-03-19 NOTE — Telephone Encounter (Signed)
Patient aware and has already picked up a copy of letter at front desk.

## 2023-03-28 ENCOUNTER — Other Ambulatory Visit: Payer: Medicare HMO

## 2023-03-28 NOTE — Progress Notes (Signed)
   03/28/2023  Patient ID: Hannah Rojas, female   DOB: 1946/05/16, 77 y.o.   MRN: [id number]  Patient outreach attempt for scheduled telephone visit unsuccessful.  Called and left HIPAA compliant voicemail x2.  I left my direct number for patient to call and reschedule; I will call her again next week if I do not hear back from her.    Hannah Rojas, PharmD, DPLA

## 2023-04-03 ENCOUNTER — Telehealth: Payer: Self-pay

## 2023-04-03 NOTE — Progress Notes (Signed)
   04/03/2023  Patient ID: Hannah Rojas, female   DOB: [birth year], 77 y.o.   MRN: [id number]  Outreach attempt to reschedule previously scheduled telephone follow-up visit to check on home BG and possibility of increasing Ozempic.  Unable to contact patient but left HIPAA compliant voicemail with my direct number.  Will try to call again next week if I do not hear back.  Lenna Gilford, PharmD, DPLA

## 2023-04-04 ENCOUNTER — Telehealth: Payer: Self-pay

## 2023-04-04 NOTE — Progress Notes (Signed)
   04/04/2023  Patient ID: Hannah Rojas, female   DOB: [birth year], 77 y.o.   MRN: [id number]  Patient outreach to return missed call from yesterday.  We have been missing each other's calls, but I am attempting to contact to discuss tolerance/efficacy of Ozempic.  Will try to call patient again next week.  Hannah Rojas, PharmD, DPLA

## 2023-04-16 ENCOUNTER — Telehealth: Payer: Self-pay

## 2023-04-16 NOTE — Progress Notes (Signed)
   04/16/2023  Patient ID: Hannah Rojas, female   DOB: [birth year], 77 y.o.   MRN: [id number]  S/O Telephone follow-up check on diabetes management  Diabetes Management Plan -Current medications:  Ozempic 0.5mg  weekly, metformin XR 1000mg  daily  -Patient endorses FBG 129 -Recent A1c in June was 6.8 -Continues to tolerate Ozempic 0.5mg  well; she still has the 2mg  received in error on hand at home   A/P  Diabetes Management Plan -Currently controlled on current regimen; continue pharmacotherapy as prescribed -Patient sees Dr. Linford Arnold again in September to f/u on A1c  Follow-up:  Thurston Hole to check in upon return and discuss possibly increasing Ozempic to 1mg  if needed at that time  Lenna Gilford, PharmD, DPLA

## 2023-05-28 ENCOUNTER — Encounter: Payer: Self-pay | Admitting: Family Medicine

## 2023-05-28 ENCOUNTER — Telehealth: Payer: Self-pay

## 2023-05-28 ENCOUNTER — Telehealth: Payer: Self-pay | Admitting: Family Medicine

## 2023-05-28 ENCOUNTER — Ambulatory Visit (INDEPENDENT_AMBULATORY_CARE_PROVIDER_SITE_OTHER): Payer: Medicare PPO | Admitting: Family Medicine

## 2023-05-28 VITALS — BP 107/58 | HR 79 | Ht 61.0 in | Wt 134.0 lb

## 2023-05-28 DIAGNOSIS — F5101 Primary insomnia: Secondary | ICD-10-CM

## 2023-05-28 DIAGNOSIS — E785 Hyperlipidemia, unspecified: Secondary | ICD-10-CM

## 2023-05-28 DIAGNOSIS — E1169 Type 2 diabetes mellitus with other specified complication: Secondary | ICD-10-CM | POA: Diagnosis not present

## 2023-05-28 DIAGNOSIS — Z23 Encounter for immunization: Secondary | ICD-10-CM

## 2023-05-28 DIAGNOSIS — Z7984 Long term (current) use of oral hypoglycemic drugs: Secondary | ICD-10-CM | POA: Diagnosis not present

## 2023-05-28 DIAGNOSIS — E119 Type 2 diabetes mellitus without complications: Secondary | ICD-10-CM

## 2023-05-28 DIAGNOSIS — N1831 Chronic kidney disease, stage 3a: Secondary | ICD-10-CM | POA: Diagnosis not present

## 2023-05-28 LAB — POCT GLYCOSYLATED HEMOGLOBIN (HGB A1C): Hemoglobin A1C: 6.3 % — AB (ref 4.0–5.6)

## 2023-05-28 MED ORDER — ZOLPIDEM TARTRATE 5 MG PO TABS
2.5000 mg | ORAL_TABLET | Freq: Every evening | ORAL | 1 refills | Status: DC | PRN
Start: 2023-05-28 — End: 2023-07-19

## 2023-05-28 MED ORDER — METFORMIN HCL ER 500 MG PO TB24: 500.0000 mg | ORAL_TABLET | Freq: Every day | ORAL | 0 refills | Status: DC

## 2023-05-28 NOTE — Progress Notes (Signed)
Established Patient Office Visit  Subjective   Patient ID: Hannah Rojas, female    DOB: February 26, 1946  Age: 77 y.o. MRN: [id number]  Chief Complaint  Patient presents with   Diabetes    HPI  Diabetes - no hypoglycemic events. No wounds or sores that are not healing well. No increased thirst or urination. Checking glucose at home. Taking medications as prescribed without any side effects.  Currently on 0.5 mg for the last several months.  The last 2 weeks though she has had a couple episodes of vomiting one of them was after she had some fried potatoes.  F/U CKD 3 -  no recent changes.   F/U mood - we increased the Lexapro to 10mg .  She reports that she feels like the bump in dose has been helpful.  Denies any depressive or anxiety symptoms but still struggling with falling asleep and also with having little energy.  Hyperlipidemia-we had decreased her Crestor because she had felt like it was causing her to feel more tired and when she stopped it for few weeks she did feel better but she did agree to go back on a lower dose and she does feel like that is been better in regards to how she is feeling.    ROS    Objective:     BP (!) 107/58   Pulse 79   Ht 5\' 1"  (1.549 m)   Wt 134 lb (60.8 kg)   SpO2 100%   BMI 25.32 kg/m     Physical Exam Vitals and nursing note reviewed.  Constitutional:      Appearance: Normal appearance.  HENT:     Head: Normocephalic and atraumatic.  Eyes:     Conjunctiva/sclera: Conjunctivae normal.  Cardiovascular:     Rate and Rhythm: Normal rate and regular rhythm.  Pulmonary:     Effort: Pulmonary effort is normal.     Breath sounds: Normal breath sounds.  Skin:    General: Skin is warm and dry.  Neurological:     Mental Status: She is alert.  Psychiatric:        Mood and Affect: Mood normal.     Results for orders placed or performed in visit on 05/28/23  POCT HgB A1C  Result Value Ref Range   Hemoglobin A1C 6.3 (A) 4.0 -  5.6 %   HbA1c POC (<> result, manual entry)     HbA1c, POC (prediabetic range)     HbA1c, POC (controlled diabetic range)         The 10-year ASCVD risk score (Arnett DK, et al., 2019) is: 30.3%    Assessment & Plan:   Problem List Items Addressed This Visit       Endocrine   Hyperlipidemia associated with type 2 diabetes mellitus (HCC)    Tolerating lower dose of statin a little better.      Relevant Medications   metFORMIN (GLUCOPHAGE-XR) 500 MG 24 hr tablet   Other Relevant Orders   Basic Metabolic Panel (BMET)   Diabetes mellitus, type II (HCC) - Primary    A1c looks great today at 6.3.  Will go ahead and decrease metformin down to 1 tab daily plan will be to increase Ozempic to 1 mg in November and then hopefully come off of the metformin completely.      Relevant Medications   metFORMIN (GLUCOPHAGE-XR) 500 MG 24 hr tablet   Other Relevant Orders   Basic Metabolic Panel (BMET)   POCT HgB A1C (Completed)  Genitourinary   KIDNEY DISEASE, CHRONIC, STAGE III    Due to recheck renal function.       Relevant Orders   Basic Metabolic Panel (BMET)     Other   Primary insomnia    Still really struggling with difficulty falling asleep it has been persistent for a while we did discuss maybe moving the Lexapro to the morning just to see if that was helpful at all.  She already tried some over-the-counter melatonin and went up to 5 mg I did not notice any improvement or relief.  Trial of trazodone caused nightmares.  Will do a low-dose of Ambien encouraged her to start with a half a tab.  Can use as needed.  We discussed using just short-term to see if we can reset her sleep cycle.      Relevant Medications   zolpidem (AMBIEN) 5 MG tablet   Other Visit Diagnoses     Encounter for immunization       Relevant Orders   Flu Vaccine Trivalent High Dose (Fluad) (Completed)       Had requested for her eye exam back in June but did not verse received the report so we  will try to contact them again today.  Return in about 3 months (around 08/27/2023) for dm/bp.    Nani Gasser, MD

## 2023-05-28 NOTE — Assessment & Plan Note (Addendum)
Tolerating lower dose of statin a little better.

## 2023-05-28 NOTE — Assessment & Plan Note (Addendum)
Still really struggling with difficulty falling asleep it has been persistent for a while we did discuss maybe moving the Lexapro to the morning just to see if that was helpful at all.  She already tried some over-the-counter melatonin and went up to 5 mg I did not notice any improvement or relief.  Trial of trazodone caused nightmares.  Will do a low-dose of Ambien encouraged her to start with a half a tab.  Can use as needed.  We discussed using just short-term to see if we can reset her sleep cycle.

## 2023-05-28 NOTE — Patient Instructions (Signed)
Okay to decrease metformin down to 1 tab daily. Work on getting the next shipment in November for 1 mg semaglutide.

## 2023-05-28 NOTE — Assessment & Plan Note (Signed)
A1c looks great today at 6.3.  Will go ahead and decrease metformin down to 1 tab daily plan will be to increase Ozempic to 1 mg in November and then hopefully come off of the metformin completely.

## 2023-05-28 NOTE — Progress Notes (Signed)
   05/28/2023  Patient ID: Hannah Rojas, female   DOB: [birth year], 77 y.o.   MRN: [id number]  Clinic routed request from Dr. Linford Arnold regarding increasing patient's Ozempic dose to 1mg  weekly.  She is approved for [employer] PAP, and currently receives the 0.5mg  dose through the program.  I have initiated the order change form and will fax to [facility] for completion.  Once approved, the 1mg  should be shipped to [facility] in 10-14 business days.  Attempted to contact patient to inform her this is being worked on and scheduling a follow-up visit  around November to see how 1mg  dose is tolerated.  I was not able to reach her, but a HIPAA compliant voicemail was left with my direct phone number.  Lenna Gilford, PharmD, DPLA

## 2023-05-28 NOTE — Assessment & Plan Note (Signed)
Due to recheck renal function. 

## 2023-05-28 NOTE — Telephone Encounter (Signed)
Hi Hannah Rojas, she is due for a refill on her PAP Ozempic I believe in early November she said she still had 2 boxes of her current prescription of 0.5 mg I would like to go ahead and bump her up to 1 mg at that time.  I did go ahead and decrease her metformin to 500 once a day instead of 1000 once a day today.  Plan will be to bump her up to 1 mg and then stop her metformin completely.

## 2023-05-29 LAB — BASIC METABOLIC PANEL
BUN/Creatinine Ratio: 16 (ref 12–28)
BUN: 15 mg/dL (ref 8–27)
CO2: 24 mmol/L (ref 20–29)
Calcium: 9.6 mg/dL (ref 8.7–10.3)
Chloride: 98 mmol/L (ref 96–106)
Creatinine, Ser: 0.95 mg/dL (ref 0.57–1.00)
Glucose: 103 mg/dL — ABNORMAL HIGH (ref 70–99)
Potassium: 4.5 mmol/L (ref 3.5–5.2)
Sodium: 141 mmol/L (ref 134–144)
eGFR: 62 mL/min/{1.73_m2} (ref >59)

## 2023-05-29 NOTE — Progress Notes (Signed)
Your lab work is within acceptable range and there are no concerning findings.   ?

## 2023-06-05 ENCOUNTER — Telehealth: Payer: Self-pay

## 2023-06-05 NOTE — Telephone Encounter (Signed)
Forwarding to [place] as an Financial planner.  [employer] Nordisk PAP shipment for Ozempic 0.25/0.5 mg dose / 5 boxes received this morning. Please contact the patient to come and pick up their order today. Placed in the PAP fridge with patient identifier. Thanks in advance.   NDC: 7846-9629-52 LOT: [id number] EXP: 2024-10-18

## 2023-06-05 NOTE — Telephone Encounter (Signed)
Patient asst form faxed and confirmation received and scanned into pt's chart.

## 2023-06-06 ENCOUNTER — Telehealth: Payer: Self-pay

## 2023-06-06 NOTE — Progress Notes (Signed)
06/06/2023  Patient ID: Hannah Rojas, female   DOB: Nov 14, 1945, 77 y.o.   MRN: [id number]  Patient outreach to discuss Ozempic dosing.  Dr. Linford Arnold recently submitted and order change form for 1mg  Ozempic from [employer], but a shipment was just received for 0.25/0.5mg  pens,  Instructed patient to go ahead and start using 2 injections of 0.5mg  on Monday when she is due for her next dose.  She can continue this dosing until 1mg  pens arrive from [employer] PAP.  Telephone follow-up visit scheduled to check on tolerance/efficacy 10/21.  Lenna Gilford, PharmD, DPLA

## 2023-06-06 NOTE — Telephone Encounter (Signed)
Pt notified of shipment.

## 2023-06-07 NOTE — Telephone Encounter (Signed)
Patient came by office and received her 5 boxes of Ozempic, thanks.

## 2023-06-15 ENCOUNTER — Telehealth: Payer: Self-pay

## 2023-06-15 NOTE — Telephone Encounter (Signed)
Patient assistance Oxempic 4mg  / 3ml  received 4 boxes ( 1 pen per box)   Attempted call to patient. Left a voice mail requesting a return call.

## 2023-06-19 NOTE — Progress Notes (Signed)
06/19/2023  Patient ID: Hannah Rojas, female   DOB: 05/04/1946, 77 y.o.   MRN: [id number]  Patient outreach to inform [name] the Ozempic 1mg  from [employer] PAP has arrived at [facility], and she can pick this up at her convenience.  We have a telephone follow-up scheduled for 10/21.  Lenna Gilford, PharmD, DPLA

## 2023-06-20 NOTE — Telephone Encounter (Signed)
Patient informed. 

## 2023-07-09 ENCOUNTER — Other Ambulatory Visit: Payer: Self-pay

## 2023-07-09 NOTE — Progress Notes (Signed)
07/09/2023  Patient ID: Hannah Rojas, female   DOB: 11-20-1945, 77 y.o.   MRN: [id number]  S/O Telephone visit to follow-up on management of T2DM  Diabetes Management Plan -Current medications:  metformin XR 500mg  daily, Ozempic 1mg  weekly -Patient was using two injections of Ozemipc 0.5mg  to equal 1mg , but 1mg  pens arrived from [employer] PAP.  She is tolerating well but states her first injection of the 1mg  pen did sting, which she had not experienced before  -FBG 107, 117 the past two days -Last A1c 6.3 -Patient does not endorse any s/sx of hypoglycemia  A/P  Diabetes Management Plan -Currently controlled -Patient sees Dr. Linford Arnold again in December and will be due for A1c -Recommend continuing current regimen, regular monitoring of BG, and regular follow-up with PCP  Follow-up:  12 weeks  Lenna Gilford, PharmD, DPLA

## 2023-07-17 ENCOUNTER — Other Ambulatory Visit: Payer: Self-pay | Admitting: Family Medicine

## 2023-07-17 DIAGNOSIS — F5101 Primary insomnia: Secondary | ICD-10-CM

## 2023-07-19 ENCOUNTER — Telehealth: Payer: Self-pay | Admitting: Family Medicine

## 2023-07-19 NOTE — Telephone Encounter (Signed)
Spoke w/pt and she stated that she has been taking a whole tab nightly for the past 1.5 months because the half tab wasn't effective. She picked up the refill on 10/10 but did not call to inform Dr. Linford Arnold that she needed to take a whole tab to help her to sleep.   I told her that I would fwd this to Dr. Linford Arnold to see whether or not she would send over a prescription that would reflect her taking a whole tablet nightly.

## 2023-07-19 NOTE — Telephone Encounter (Signed)
Please find out what [facility] she went to for eye and call for report.  Thank you!

## 2023-07-25 NOTE — Telephone Encounter (Signed)
Did wel get this report?

## 2023-08-21 ENCOUNTER — Other Ambulatory Visit (HOSPITAL_COMMUNITY): Payer: Self-pay

## 2023-08-27 ENCOUNTER — Ambulatory Visit (INDEPENDENT_AMBULATORY_CARE_PROVIDER_SITE_OTHER): Payer: Medicare PPO | Admitting: Family Medicine

## 2023-08-27 ENCOUNTER — Encounter: Payer: Self-pay | Admitting: Family Medicine

## 2023-08-27 ENCOUNTER — Ambulatory Visit: Payer: Medicare Other | Admitting: Family Medicine

## 2023-08-27 VITALS — BP 104/59 | HR 81 | Ht 61.0 in | Wt 131.0 lb

## 2023-08-27 DIAGNOSIS — Z7984 Long term (current) use of oral hypoglycemic drugs: Secondary | ICD-10-CM | POA: Diagnosis not present

## 2023-08-27 DIAGNOSIS — F4329 Adjustment disorder with other symptoms: Secondary | ICD-10-CM | POA: Diagnosis not present

## 2023-08-27 DIAGNOSIS — N1831 Chronic kidney disease, stage 3a: Secondary | ICD-10-CM

## 2023-08-27 DIAGNOSIS — E119 Type 2 diabetes mellitus without complications: Secondary | ICD-10-CM | POA: Diagnosis not present

## 2023-08-27 LAB — POCT GLYCOSYLATED HEMOGLOBIN (HGB A1C): Hemoglobin A1C: 6.4 % — AB (ref 4.0–5.6)

## 2023-08-27 NOTE — Progress Notes (Signed)
Attempted to call Dera three times for annual medicare wellness visit. No answer. Visit  not completed.

## 2023-08-27 NOTE — Progress Notes (Signed)
Established Patient Office Visit  Subjective   Patient ID: Hannah Rojas, female    DOB: 08-05-1946  Age: 77 y.o. MRN: [id number]  Chief Complaint  Patient presents with   Diabetes    HPI Diabetes - no hypoglycemic events. No wounds or sores that are not healing well. No increased thirst or urination. Checking glucose at home. Taking medications as prescribed without any side effects.  Follow-up stress and adjustment reaction-currently on Lexapro 10 mg.  Doing well with current regimen no concerns today.  Follow up CKD 3-on 2.5 mg of lisinopril daily no recent changes.  She is really cut down on her Ambien.  She says she is just using it once in a while if she really needs it. She is not using melatonin.      ROS    Objective:     BP (!) 104/59   Pulse 81   Ht 5\' 1"  (1.549 m)   Wt 131 lb (59.4 kg)   SpO2 100%   BMI 24.75 kg/m    Physical Exam Vitals and nursing note reviewed.  Constitutional:      Appearance: Normal appearance.  HENT:     Head: Normocephalic and atraumatic.  Eyes:     Conjunctiva/sclera: Conjunctivae normal.  Cardiovascular:     Rate and Rhythm: Normal rate and regular rhythm.  Pulmonary:     Effort: Pulmonary effort is normal.     Breath sounds: Normal breath sounds.  Skin:    General: Skin is warm and dry.  Neurological:     Mental Status: She is alert.  Psychiatric:        Mood and Affect: Mood normal.      Results for orders placed or performed in visit on 08/27/23  POCT HgB A1C  Result Value Ref Range   Hemoglobin A1C 6.4 (A) 4.0 - 5.6 %   HbA1c POC (<> result, manual entry)     HbA1c, POC (prediabetic range)     HbA1c, POC (controlled diabetic range)        The 10-year ASCVD risk score (Arnett DK, et al., 2019) is: 28.9%    Assessment & Plan:   Problem List Items Addressed This Visit       Endocrine   Diabetes mellitus, type II (HCC) - Primary    Did discuss going up on the semaglutide to 2 mg.  She  actually says she has about a 90-day supply at home that she had filled previously.  When she runs out of the 1 mg later this month encouraged her to go to the 2 mg if she tolerates that well then we will continue with that.  Once she starts the 2 mg then we will discontinue the metformin.  She also stopped her statin and I encouraged her to restart the medication.        Relevant Orders   POCT HgB A1C (Completed)     Genitourinary   KIDNEY DISEASE, CHRONIC, STAGE III    Continue lisinopril         Other   Stress and adjustment reaction    She had initially said she was taking her Lexapro but later said she wasn't sure so I marked it on her list so she could check it iat home.  She is doing well PHQ-9 score of 0 and GAD-7 score of 0.         Return in about 15 weeks (around 12/10/2023) for Diabetes follow-up.    Nani Gasser,  MD

## 2023-08-27 NOTE — Patient Instructions (Signed)
You run out of the 1 mg dose of your current medicines at home go ahead and start the 2 mg that you already have and stop your metformin.

## 2023-08-27 NOTE — Assessment & Plan Note (Signed)
Continue lisinopril

## 2023-08-27 NOTE — Assessment & Plan Note (Addendum)
Did discuss going up on the semaglutide to 2 mg.  She actually says she has about a 90-day supply at home that she had filled previously.  When she runs out of the 1 mg later this month encouraged her to go to the 2 mg if she tolerates that well then we will continue with that.  Once she starts the 2 mg then we will discontinue the metformin.  She also stopped her statin and I encouraged her to restart the medication.

## 2023-08-27 NOTE — Assessment & Plan Note (Signed)
She had initially said she was taking her Lexapro but later said she wasn't sure so I marked it on her list so she could check it iat home.  She is doing well PHQ-9 score of 0 and GAD-7 score of 0.

## 2023-08-30 NOTE — Progress Notes (Signed)
   08/30/2023  Patient ID: Hannah Rojas, female   DOB: [birth year], 77 y.o.   MRN: [id number]  Patient is enrolled in [employer] PAP for Ozempic 1mg  weekly, and this enrollment ends 09/17/2024.  Patient recently saw Dr. Linford Arnold and was told to start 2mg  weekly once she has used 1mg  pens at home.  Patient currently has some 2mg   pens at home from before that she can use, but I am also submitting her 2025 re-enrollment application for the 2mg  dose.  This has been sent to [employer] PAP online.  Lenna Gilford, PharmD, DPLA

## 2023-09-01 ENCOUNTER — Other Ambulatory Visit: Payer: Self-pay | Admitting: Family Medicine

## 2023-09-05 NOTE — Telephone Encounter (Signed)
Copied from CRM [id number]. Topic: Clinical - Prescription Issue >> Sep 04, 2023  4:43 PM Fuller Mandril wrote: Reason for CRM: Pt called stated pharmacy has been trying to get provider approval for medication lisinopril (ZESTRIL) 2.5 MG tablet. Requested 12/14 has not yet received provider approval. Pt states she does have a few left. Should be sent to [facility] - same as requested by pharmacy. Pt also wanted to let provider know that she has stopped taking metFORMIN (GLUCOPHAGE-XR) 500 MG 24 hr tablet. Reach out to pt with any questions or concerns. Thank You

## 2023-09-05 NOTE — Telephone Encounter (Signed)
Is looks like was filled today.

## 2023-09-10 ENCOUNTER — Telehealth: Payer: Self-pay

## 2023-09-10 ENCOUNTER — Other Ambulatory Visit: Payer: Self-pay | Admitting: Family Medicine

## 2023-09-10 DIAGNOSIS — E119 Type 2 diabetes mellitus without complications: Secondary | ICD-10-CM

## 2023-09-10 MED ORDER — ONETOUCH ULTRA VI STRP: ORAL_STRIP | 1 refills | Status: AC

## 2023-09-10 NOTE — Telephone Encounter (Signed)
Copied from CRM [id number]. Topic: Clinical - Medication Refill >> Sep 10, 2023  9:45 AM Jorje Guild R wrote: Most Recent Primary Care Visit:  Provider: Nani Gasser D  Department: [facility] CARE [facility]  Visit Type: OFFICE VISIT  Date: 08/27/2023  Medication:  ONETOUCH ULTRA test strip Has the patient contacted their pharmacy? No (Agent: If no, request that the patient contact the pharmacy for the refill. If patient does not wish to contact the pharmacy document the reason why and proceed with request.) (Agent: If yes, when and what did the pharmacy advise?)  Is this the correct pharmacy for this prescription? Yes If no, delete pharmacy and type the correct one.  This is the patient's preferred pharmacy:  [facility] [address] [place] - [address] [place] [address] Phone: [phone] Fax: [phone]   Has the prescription been filled recently? No  Is the patient out of the medication? Yes  Has the patient been seen for an appointment in the last year OR does the patient have an upcoming appointment? Yes  Can we respond through MyChart? Yes  Agent: Please be advised that Rx refills may take up to 3 business days. We ask that you follow-up with your pharmacy.

## 2023-09-10 NOTE — Telephone Encounter (Signed)
Refill sent to pharmacy today per protocol

## 2023-09-15 ENCOUNTER — Other Ambulatory Visit: Payer: Self-pay | Admitting: Family Medicine

## 2023-09-15 DIAGNOSIS — F4329 Adjustment disorder with other symptoms: Secondary | ICD-10-CM

## 2023-09-21 ENCOUNTER — Other Ambulatory Visit: Payer: Self-pay | Admitting: *Deleted

## 2023-10-08 ENCOUNTER — Other Ambulatory Visit: Payer: No Typology Code available for payment source

## 2023-10-08 NOTE — Progress Notes (Signed)
   10/08/2023  Patient ID: Joanette Gula, female   DOB: 11/01/1945, 78 y.o.   MRN: [id number]  Subjective/Objective Telephone visit to follow-up on management of diabetes  Diabetes management -Current medications: Ozempic 2 mg weekly -A1c on 12 9 was 6.4% -Patient recent recently increased to Ozempic 2 mg weekly and stopped metformin therapy -Endorses tolerating Ozempic 2 mg well with no adverse side effects -Patient does states she is recovering from recent a stomach bug.  She is no longer experiencing any nausea, vomiting or diarrhea and states that as of Saturday, she has been able to resume somewhat normal oral intake -Patient is due for her Ozempic dose today and plans to take this later in the day -Endorses checking fasting blood glucose regularly and states this was 116 this morning -She does not endorse any signs or symptoms of hypoglycemia -Patient receives Ozempic through [employer] patient assistance program, and 2025 reenrollment application for 2 mg was recently submitted online.  Patient states she has not heard anything from the program in regard to the application.  I will contact [employer] later this week to verify she has been approved and that medication will be arriving to [facility] later this month.  Hyperlipidemia -Patient was seen by Dr. Carlynn Herald in December she informed her provider that she had not been taking the rosuvastatin 10 mg daily at bedtime.  Dr. Linford Arnold encouraged her to resume this therapy. -Patient states today that she has not resumed rosuvastatin 10 mg, because she cannot remember to take it at bedtime since all other medications are taken earlier in the day.  Educated patient that medication, unlike other statins, does not need to be taken at bedtime based on efficacy of rosuvastatin.  Assessment/Plan  Diabetes management -Currently controlled -Continue current regimen, regular monitoring/recording of home blood glucose, and regular follow-up with PCP and  Pharm.D  Hyperlipidemia -Patient plans to resume rosuvastatin 10 mg daily taking this in the morning with her other medications -Cholesterol was previously well-controlled on this regimen; I recommend follow-up lipid panel at patient's next visit with PCP in March  Follow-up: 3 months  Lenna Gilford, PharmD, DPLA

## 2023-10-09 ENCOUNTER — Telehealth: Payer: Self-pay | Admitting: Family Medicine

## 2023-10-09 NOTE — Telephone Encounter (Signed)
Copied from CRM [id number]. Topic: General - Other >> Oct 09, 2023 10:23 AM Antony Haste wrote: Reason for CRM: Helmut Muster from [employer] Patient Assistance Program is wanting a callback from Sabino Niemann or anyone who handles paperwork on behalf of this patient. They have faxed over documentation on 01/06 and 01/15.  Callback #: [phone]

## 2023-10-09 NOTE — Progress Notes (Signed)
   10/09/2023  Patient ID: Hannah Rojas, female   DOB: [birth year], 78 y.o.   MRN: [id number]  Contacted [employer] patient assistance program, and patient has been approved to receive Ozempic 2 mg through 09/17/2024.  This medication should arrive at Dr. Shelah Lewandowsky office in 10-14 business days.  Lenna Gilford, PharmD, DPLA

## 2023-12-06 ENCOUNTER — Telehealth: Payer: Self-pay

## 2023-12-06 NOTE — Telephone Encounter (Signed)
 Forwarding to [employer] as an Financial planner.  [employer] Nord[employer] PAP shipment for Ozempic 2 mg dose / 4 boxes received this morning. Please contact the patient to come and pick up their order today. Placed in the PAP fridge with patient identifier. Thanks in advance.   NDC: 1610-9604-54 LOT: [id number] EXP: 2026-05-18

## 2023-12-06 NOTE — Telephone Encounter (Signed)
 OPEN IN ERROR

## 2023-12-10 ENCOUNTER — Ambulatory Visit (INDEPENDENT_AMBULATORY_CARE_PROVIDER_SITE_OTHER): Payer: Medicare (Managed Care) | Admitting: Family Medicine

## 2023-12-10 ENCOUNTER — Encounter: Payer: Self-pay | Admitting: Family Medicine

## 2023-12-10 VITALS — BP 117/57 | HR 73 | Ht 61.0 in | Wt 130.0 lb

## 2023-12-10 DIAGNOSIS — E119 Type 2 diabetes mellitus without complications: Secondary | ICD-10-CM

## 2023-12-10 DIAGNOSIS — E785 Hyperlipidemia, unspecified: Secondary | ICD-10-CM

## 2023-12-10 DIAGNOSIS — N1831 Chronic kidney disease, stage 3a: Secondary | ICD-10-CM | POA: Diagnosis not present

## 2023-12-10 DIAGNOSIS — F4329 Adjustment disorder with other symptoms: Secondary | ICD-10-CM | POA: Diagnosis not present

## 2023-12-10 DIAGNOSIS — E1169 Type 2 diabetes mellitus with other specified complication: Secondary | ICD-10-CM | POA: Diagnosis not present

## 2023-12-10 DIAGNOSIS — Z7985 Long-term (current) use of injectable non-insulin antidiabetic drugs: Secondary | ICD-10-CM

## 2023-12-10 LAB — POCT GLYCOSYLATED HEMOGLOBIN (HGB A1C): Hemoglobin A1C: 6.3 % — AB (ref 4.0–5.6)

## 2023-12-10 MED ORDER — LISINOPRIL 2.5 MG PO TABS: 2.5000 mg | ORAL_TABLET | Freq: Every day | ORAL | 3 refills | Status: AC

## 2023-12-10 MED ORDER — ROSUVASTATIN CALCIUM 10 MG PO TABS: 10.0000 mg | ORAL_TABLET | Freq: Every day | ORAL | 3 refills | Status: AC

## 2023-12-10 NOTE — Assessment & Plan Note (Signed)
 Currently on Lexapro 10 mg.  PHQ-9 score of 0 and GAD-7 score of 0.  She feels like she is happy with her current regimen.

## 2023-12-10 NOTE — Telephone Encounter (Signed)
 Pt given samples at [facility] today.

## 2023-12-10 NOTE — Assessment & Plan Note (Signed)
 Due for updated lipid panel today currently on Crestor 10 mg tolerating well.

## 2023-12-10 NOTE — Assessment & Plan Note (Addendum)
 A1c of 6.3 today which looks fantastic.  Continue Ozempic 2 mg.  Reviewed some dietary changes still encouraged her to really work on trying to increase her vegetable intake and getting an adequate protein and reducing her carb intake think she still struggling a little bit with that.

## 2023-12-10 NOTE — Assessment & Plan Note (Addendum)
 Continue to follow renal function every 6 months.  She is on low-dose lisinopril for renal protection.

## 2023-12-10 NOTE — Progress Notes (Signed)
 Established Patient Office Visit  Subjective  Patient ID: Hannah Rojas, female    DOB: 09/15/1946  Age: 78 y.o. MRN: [id number]  Chief Complaint  Patient presents with   Diabetes    HPI  Diabetes - no hypoglycemic events. No wounds or sores that are not healing well. No increased thirst or urination. Checking glucose at home. Taking medications as prescribed without any side effects.  Hyperlipidemia - tolerating stating well with no myalgias or significant side effects.  Lab Results  Component Value Date   CHOL 145 11/20/2022   HDL 89 11/20/2022   LDLCALC 38 11/20/2022   LDLDIRECT 107 (H) 05/27/2014   TRIG 97 11/20/2022   CHOLHDL 1.6 11/20/2022        ROS    Objective:     BP (!) 117/57   Pulse 73   Ht 5\' 1"  (1.549 m)   Wt 130 lb (59 kg)   SpO2 100%   BMI 24.56 kg/m    Physical Exam Vitals and nursing note reviewed.  Constitutional:      Appearance: Normal appearance.  HENT:     Head: Normocephalic and atraumatic.  Eyes:     Conjunctiva/sclera: Conjunctivae normal.  Cardiovascular:     Rate and Rhythm: Normal rate and regular rhythm.  Pulmonary:     Effort: Pulmonary effort is normal.     Breath sounds: Normal breath sounds.  Skin:    General: Skin is warm and dry.  Neurological:     Mental Status: She is alert.  Psychiatric:        Mood and Affect: Mood normal.      Results for orders placed or performed in visit on 12/10/23  POCT HgB A1C  Result Value Ref Range   Hemoglobin A1C 6.3 (A) 4.0 - 5.6 %   HbA1c POC (<> result, manual entry)     HbA1c, POC (prediabetic range)     HbA1c, POC (controlled diabetic range)        The 10-year ASCVD risk score (Arnett DK, et al., 2019) is: 39.4%    Assessment & Plan:   Problem List Items Addressed This Visit       Endocrine   Hyperlipidemia associated with type 2 diabetes mellitus (HCC)   Due for updated lipid panel today currently on Crestor 10 mg tolerating well.      Relevant  Medications   lisinopril (ZESTRIL) 2.5 MG tablet   rosuvastatin (CRESTOR) 10 MG tablet   Other Relevant Orders   POCT HgB A1C (Completed)   Lipid Panel With LDL/HDL Ratio   CMP14+EGFR   Diabetes mellitus, type II (HCC) - Primary   A1c of 6.3 today which looks fantastic.  Continue Ozempic 2 mg.  Reviewed some dietary changes still encouraged her to really work on trying to increase her vegetable intake and getting an adequate protein and reducing her carb intake think she still struggling a little bit with that.      Relevant Medications   lisinopril (ZESTRIL) 2.5 MG tablet   rosuvastatin (CRESTOR) 10 MG tablet   Other Relevant Orders   POCT HgB A1C (Completed)   Lipid Panel With LDL/HDL Ratio   CMP14+EGFR     Genitourinary   KIDNEY DISEASE, CHRONIC, STAGE III   Continue to follow renal function every 6 months.  She is on low-dose lisinopril for renal protection.      Relevant Medications   lisinopril (ZESTRIL) 2.5 MG tablet   Other Relevant Orders   POCT HgB  A1C (Completed)   Lipid Panel With LDL/HDL Ratio   CMP14+EGFR     Other   Stress and adjustment reaction   Currently on Lexapro 10 mg.  PHQ-9 score of 0 and GAD-7 score of 0.  She feels like she is happy with her current regimen.       Return in about 4 months (around 04/10/2024) for Diabetes follow-up, Hypertension.    Nani Gasser, MD

## 2023-12-11 ENCOUNTER — Encounter: Payer: Self-pay | Admitting: Family Medicine

## 2023-12-11 LAB — CMP14+EGFR
ALT: 11 IU/L (ref 0–32)
AST: 16 IU/L (ref 0–40)
Albumin: 4 g/dL (ref 3.8–4.8)
Alkaline Phosphatase: 73 IU/L (ref 44–121)
BUN/Creatinine Ratio: 19 (ref 12–28)
BUN: 15 mg/dL (ref 8–27)
Bilirubin Total: 0.6 mg/dL (ref 0.0–1.2)
CO2: 26 mmol/L (ref 20–29)
Calcium: 9.4 mg/dL (ref 8.7–10.3)
Chloride: 103 mmol/L (ref 96–106)
Creatinine, Ser: 0.79 mg/dL (ref 0.57–1.00)
Globulin, Total: 2.4 g/dL (ref 1.5–4.5)
Glucose: 99 mg/dL (ref 70–99)
Potassium: 4.3 mmol/L (ref 3.5–5.2)
Sodium: 141 mmol/L (ref 134–144)
Total Protein: 6.4 g/dL (ref 6.0–8.5)
eGFR: 77 mL/min/{1.73_m2} (ref >59)

## 2023-12-11 LAB — LIPID PANEL WITH LDL/HDL RATIO
Cholesterol, Total: 150 mg/dL (ref 100–199)
HDL: 61 mg/dL (ref >39)
LDL Chol Calc (NIH): 73 mg/dL (ref 0–99)
LDL/HDL Ratio: 1.2 ratio (ref 0.0–3.2)
Triglycerides: 86 mg/dL (ref 0–149)
VLDL Cholesterol Cal: 16 mg/dL (ref 5–40)

## 2023-12-11 NOTE — Progress Notes (Signed)
 Jenni, LDL cholesterol and total cholesterol look good.  Metabolic panel is normal.  Lets get you scheduled for your Medicare wellness exam with our Medicare wellness nurse Marylene Land.  Just give [place] a call back and we will get you scheduled on her calendar for a phone call.  It looks like we did not get ears completed in December.

## 2023-12-27 ENCOUNTER — Other Ambulatory Visit: Payer: Self-pay | Admitting: Family Medicine

## 2023-12-27 DIAGNOSIS — F4329 Adjustment disorder with other symptoms: Secondary | ICD-10-CM

## 2024-01-07 ENCOUNTER — Other Ambulatory Visit: Payer: Self-pay

## 2024-01-07 NOTE — Progress Notes (Signed)
   01/07/2024  Patient ID: Bluford Burkitt, female   DOB: [birth year], 78 y.o.   MRN: [id number]  Subjective/Objective Telephone visit to follow-up on management of diabetes   Diabetes management -Current medications: Ozempic  2 mg weekly -A1c on 3/24 was 6.3% -Endorses tolerating Ozempic  2 mg well with no adverse side effects -Endorses checking fasting blood glucose regularly, ranging 96-122 -She does not endorse any signs or symptoms of hypoglycemia   Hyperlipidemia -Current medications:  recently resumed rosuvastatin  10mg  daily (had been forgetting dose due to it being only bedtime medication) -Lipid panel 3/24:  TC 150, TG 86, HDL 61, LDL 73   Assessment/Plan   Diabetes management -Currently controlled -Continue current regimen, regular monitoring/recording of home blood glucose, and regular follow-up with PCP and Pharm.D   Hyperlipidemia -Moderately controlled with LDL only slightly above 70 -Continue current regimen at this time   Follow-up: 3 months   Linn Rich, PharmD, DPLA

## 2024-02-26 ENCOUNTER — Ambulatory Visit

## 2024-03-26 ENCOUNTER — Telehealth: Payer: Self-pay

## 2024-03-26 NOTE — Telephone Encounter (Signed)
 Spoke with patient.  Informed that we had received the Ozempic  2mg  x 4 pens from patient assistance . She states she will pick this up this afternoon.

## 2024-03-27 ENCOUNTER — Ambulatory Visit: Payer: Medicare (Managed Care)

## 2024-03-27 VITALS — Ht 60.0 in | Wt 130.0 lb

## 2024-03-27 DIAGNOSIS — Z0000 Encounter for general adult medical examination without abnormal findings: Secondary | ICD-10-CM

## 2024-03-27 NOTE — Patient Instructions (Signed)
  Ms. Kemple , Thank you for taking time to come for your Medicare Wellness Visit. I appreciate your ongoing commitment to your health goals. Please review the following plan we discussed and let me know if I can assist you in the future.   These are the goals we discussed:  Goals       Medication Management      Patient Goals/Self-Care Activities Over the next 14 days, patient will:  take medications as prescribed and collaborate with provider on medication access solutions  Follow Up Plan: Telephone follow up appointment with care management team member scheduled for:   2-3 weeks      Patient Stated (pt-stated)      12/27/2020 AWV Goal: Improved Nutrition/Diet  Patient will verbalize understanding that diet plays an important role in overall health and that a poor diet is a risk factor for many chronic medical conditions.  Over the next year, patient will improve self management of their diet by incorporating better food choices. Patient will utilize available community resources to help with food acquisition if needed (ex: food pantries, [address], etc) Patient will work with nutrition specialist if a referral was made       Patient Stated (pt-stated)      08/21/2022 AWV Goal: Diabetes Management  Patient will maintain an A1C level below 7.0 Patient will not develop any diabetic foot complications Patient will not experience any hypoglycemic episodes over the next 3 months Patient will notify our office of any CBG readings outside of the provider recommended range by calling [phone] Patient will adhere to provider recommendations for diabetes management  Patient Self Management Activities take all medications as prescribed and report any negative side effects monitor and record blood sugar readings as directed adhere to a low carbohydrate diet that incorporates lean proteins, vegetables, whole grains, low glycemic fruits check feet daily noting any sores, cracks, injuries,  or callous formations see PCP or podiatrist if she notices any changes in her legs, feet, or toenails Patient will visit PCP and have an A1C level checked every 3 to 6 months as directed  have a yearly eye exam to monitor for vascular changes associated with diabetes and will request that the report be sent to her pcp.  consult with her PCP regarding any changes in her health or new or worsening symptoms       Patient Stated      Patient states she would like her A1c below 6 %.        This is a list of the screening recommended for you and due dates:  Health Maintenance  Topic Date Due   Zoster (Shingles) Vaccine (1 of 2) 09/04/1965   COVID-19 Vaccine (5 - 2024-25 season) 05/20/2023   Eye exam for diabetics  01/01/2024   Yearly kidney health urinalysis for diabetes  02/19/2024   DEXA scan (bone density measurement)  03/23/2024   Flu Shot  04/18/2024   Hemoglobin A1C  06/11/2024   Complete foot exam   08/26/2024   Yearly kidney function blood test for diabetes  12/09/2024   Medicare Annual Wellness Visit  03/27/2025   DTaP/Tdap/Td vaccine (3 - Td or Tdap) 01/26/2027   Pneumococcal Vaccine for age over 80  Completed   Hepatitis C Screening  Completed   Hepatitis B Vaccine  Aged Out   HPV Vaccine  Aged Out   Meningitis B Vaccine  Aged Out   Colon Cancer Screening  Discontinued

## 2024-03-27 NOTE — Progress Notes (Signed)
 Subjective:   Hannah Rojas is a 78 y.o. female who presents for Medicare Annual (Subsequent) preventive examination.  Visit Complete: Virtual I connected with  Hannah Rojas on 03/27/24 by a audio enabled telemedicine application and verified that I am speaking with the correct person using two identifiers.  Patient Location: Home  Provider Location: Office/Clinic  I discussed the limitations of evaluation and management by telemedicine. The patient expressed understanding and agreed to proceed.  Vital Signs: Because this visit was a virtual/telehealth visit, some criteria may be missing or patient reported. Any vitals not documented were not able to be obtained and vitals that have been documented are patient reported.  Patient Medicare AWV questionnaire was completed by the patient on n/a; I have confirmed that all information answered by patient is correct and no changes since this date.  Cardiac Risk Factors include: advanced age (>49men, >57 women);diabetes mellitus;dyslipidemia;hypertension     Objective:    Today's Vitals   03/27/24 1547  Weight: 130 lb (59 kg)  Height: 5' (1.524 m)   Body mass index is 25.39 kg/m.     03/27/2024    3:57 PM 08/21/2022    1:38 PM 12/27/2020   10:13 AM 01/28/2015    3:21 PM  Advanced Directives  Does Patient Have a Medical Advance Directive? No No No No   Would patient like information on creating a medical advance directive? No - Patient declined No - Patient declined No - Patient declined Yes - Educational materials given      Data saved with a previous flowsheet row definition    Current Medications (verified) Outpatient Encounter Medications as of 03/27/2024  Medication Sig   Acetaminophen (TYLENOL 8 HOUR ARTHRITIS PAIN PO) Take 1 tablet by mouth at bedtime as needed.   blood glucose meter kit and supplies Dispense based on patient and insurance preference. Use up to four times daily as directed. Dx:E11.9    escitalopram  (LEXAPRO ) 10 MG tablet Take 1 tablet by mouth once daily   glucose blood (ONETOUCH ULTRA) test strip Use as instructed   lisinopril  (ZESTRIL ) 2.5 MG tablet Take 1 tablet (2.5 mg total) by mouth daily.   rosuvastatin  (CRESTOR ) 10 MG tablet Take 1 tablet (10 mg total) by mouth at bedtime.   Semaglutide , 2 MG/DOSE, (OZEMPIC , 2 MG/DOSE,) 8 MG/3ML SOPN Inject 2 mg into the skin once a week.   No facility-administered encounter medications on file as of 03/27/2024.    Allergies (verified) Ibuprofen, Iodinated contrast media, Sulfa antibiotics, Atorvastatin , Naproxen sodium, Pravastatin , Simvastatin , Sulfonamide derivatives, Trazodone  and nefazodone, and Xigduo  xr [dapagliflozin pro-metformin  er]   History: Past Medical History:  Diagnosis Date   Cataracts, bilateral    bilateral   Diabetes (HCC)    H. pylori infection    history   Pancreatic cyst    hx mucinous cystoadenoma   Past Surgical History:  Procedure Laterality Date   CHOLECYSTECTOMY     ESOPHAGOGASTRODUODENOSCOPY     PANCREATIC CYST EXCISION     spleenectomy     History reviewed. No pertinent family history. Social History   Socioeconomic History   Marital status: Widowed    Spouse name: Not on file   Number of children: 4   Years of education: 8th grade   Highest education level: 8th grade  Occupational History   Occupation: Oncologist: [id number]    Comment: Full time  Tobacco Use   Smoking status: Never   Smokeless tobacco: Never  Substance  and Sexual Activity   Alcohol use: No   Drug use: Never   Sexual activity: Not on file  Other Topics Concern   Not on file  Social History Narrative   Lives alone. She takes care of grandchildren through out the week. She likes to go for walks in her free time.   Social Drivers of Corporate investment banker Strain: Low Risk  (03/27/2024)   Overall Financial Resource Strain (CARDIA)    Difficulty of Paying Living Expenses: Not hard at all   Food Insecurity: No Food Insecurity (03/27/2024)   Hunger Vital Sign    Worried About Running Out of Food in the Last Year: Never true    Ran Out of Food in the Last Year: Never true  Transportation Needs: No Transportation Needs (03/27/2024)   PRAPARE - Administrator, Civil Service (Medical): No    Lack of Transportation (Non-Medical): No  Physical Activity: Insufficiently Active (03/27/2024)   Exercise Vital Sign    Days of Exercise per Week: 3 days    Minutes of Exercise per Session: 30 min  Stress: No Stress Concern Present (03/27/2024)   [employer] of Occupational Health - Occupational Stress Questionnaire    Feeling of Stress: Only a little  Social Connections: Moderately Integrated (03/27/2024)   Social Connection and Isolation Panel    Frequency of Communication with Friends and Family: More than three times a week    Frequency of Social Gatherings with Friends and Family: More than three times a week    Attends Religious Services: More than 4 times per year    Active Member of [employer] or Organizations: Yes    Attends Banker Meetings: More than 4 times per year    Marital Status: Widowed    Tobacco Counseling Counseling given: Not Answered   Clinical Intake:  Pre-visit preparation completed: Yes  Pain : No/denies pain     BMI - recorded: 25.39 Nutritional Status: BMI 25 -29 Overweight Nutritional Risks: None Diabetes: Yes CBG done?: Yes (140) CBG resulted in Enter/ Edit results?: No Did pt. bring in CBG monitor from home?: No  How often do you need to have someone help you when you read instructions, pamphlets, or other written materials from your doctor or pharmacy?: 1 - Never What is the last grade level you completed in school?: 8  Interpreter Needed?: No      Activities of Daily Living    03/27/2024    3:50 PM  In your present state of health, do you have any difficulty performing the following activities:  Hearing? 0   Vision? 0  Difficulty concentrating or making decisions? 0  Walking or climbing stairs? 1  Dressing or bathing? 0  Doing errands, shopping? 0  Preparing Food and eating ? N  Using the Toilet? N  In the past six months, have you accidently leaked urine? N  Do you have problems with loss of bowel control? N  Managing your Medications? N  Managing your Finances? N  Housekeeping or managing your Housekeeping? N    Patient Care Team: Alvan Dorothyann BIRCH, MD as PCP - General Deanna Channing LABOR, [facility] (Pharmacist)  Indicate any recent Medical Services you may have received from other than [facility] providers in the past year (date may be approximate).     Assessment:   This is a routine wellness examination for [place].  Hearing/Vision screen No results found.   Goals Addressed  This Visit's Progress    Patient Stated       Patient states she would like her A1c below 6 %.       Depression Screen    03/27/2024    3:55 PM 08/27/2023    8:26 AM 05/28/2023    8:22 AM 02/19/2023    9:25 AM 11/20/2022    8:47 AM 11/20/2022    8:20 AM 08/21/2022    1:39 PM  PHQ 2/9 Scores  PHQ - 2 Score 0 0 0 0 0 0 0  PHQ- 9 Score  0 7 2 2   0    Fall Risk    03/27/2024    3:57 PM 05/28/2023    7:57 AM 11/20/2022    8:20 AM 08/21/2022    1:39 PM 08/14/2022    9:01 AM  Fall Risk   Falls in the past year? 0 0 0 1 0  Number falls in past yr: 0 0 0 1 0  Injury with Fall? 0 0 0 1 0  Risk for fall due to : No Fall Risks No Fall Risks No Fall Risks History of fall(s) No Fall Risks  Follow up Falls evaluation completed Falls evaluation completed Falls evaluation completed Falls evaluation completed;Education provided;Falls prevention discussed  Falls evaluation completed      Data saved with a previous flowsheet row definition    MEDICARE RISK AT HOME: Medicare Risk at Home Any stairs in or around the home?: No Home free of loose throw rugs in walkways, pet beds, electrical cords, etc?:  Yes Adequate lighting in your home to reduce risk of falls?: Yes Life alert?: No Use of a cane, walker or w/c?: No Grab bars in the bathroom?: Yes Shower chair or bench in shower?: No Elevated toilet seat or a handicapped toilet?: No  TIMED UP AND GO:  Was the test performed?  No    Cognitive Function:        03/27/2024    3:58 PM 08/21/2022    1:46 PM 12/27/2020   10:26 AM  6CIT Screen  What Year? 0 points 0 points 0 points  What month? 0 points 0 points 0 points  What time? 0 points 0 points 0 points  Count back from 20 0 points 0 points 2 points  Months in reverse 4 points 4 points 4 points  Repeat phrase 0 points 2 points 2 points  Total Score 4 points 6 points 8 points    Immunizations Immunization History  Administered Date(s) Administered   Fluad Quad(high Dose 65+) 06/23/2019, 08/01/2021, 08/14/2022   Fluad Trivalent(High Dose 65+) 05/28/2023   H1N1 07/17/2008   Influenza Split 06/10/2012   Influenza Whole 08/27/2007, 06/09/2008, 06/18/2009, 07/13/2009, 06/20/2010   Influenza, High Dose Seasonal PF 06/12/2017, 06/19/2018, 07/02/2020   Influenza,inj,Quad PF,6+ Mos 05/12/2013, 05/21/2014, 06/03/2015, 06/15/2016   Moderna Sars-Covid-2 Vaccination 12/26/2019, 01/17/2020, 08/16/2020, 03/15/2021   PNEUMOCOCCAL CONJUGATE-20 08/14/2022   Pneumococcal Conjugate-13 12/02/2014   Pneumococcal Polysaccharide-23 08/27/2007, 11/20/2011   Td 06/19/2005   Tdap 01/25/2017   Zoster, Live 07/20/2010    TDAP status: Up to date  Flu Vaccine status: Up to date  Pneumococcal vaccine status: Up to date  Covid-19 vaccine status: Completed vaccines  Qualifies for Shingles Vaccine? Yes   Zostavax completed Yes   Shingrix Completed?: No.    Education has been provided regarding the importance of this vaccine. Patient has been advised to call insurance company to determine out of pocket expense if they have not yet received this  vaccine. Advised may also receive vaccine at local  pharmacy or Health Dept. Verbalized acceptance and understanding.  Screening Tests Health Maintenance  Topic Date Due   Zoster Vaccines- Shingrix (1 of 2) 09[birth year]04/1965   COVID-19 Vaccine (5 - 2024-25 season) 05/20/2023   OPHTHALMOLOGY EXAM  01/01/2024   Diabetic kidney evaluation - Urine ACR  02/19/2024   DEXA SCAN  03/23/2024   INFLUENZA VACCINE  04/18/2024   HEMOGLOBIN A1C  06/11/2024   FOOT EXAM  08/26/2024   Diabetic kidney evaluation - eGFR measurement  12/09/2024   Medicare Annual Wellness (AWV)  03/27/2025   DTaP/Tdap/Td (3 - Td or Tdap) 01/26/2027   Pneumococcal Vaccine: 50+ Years  Completed   Hepatitis C Screening  Completed   Hepatitis B Vaccines  Aged Out   HPV VACCINES  Aged Out   Meningococcal B Vaccine  Aged Out   Colonoscopy  Discontinued    Health Maintenance  Health Maintenance Due  Topic Date Due   Zoster Vaccines- Shingrix (1 of 2) 09/04/1965   COVID-19 Vaccine (5 - 2024-25 season) 05/20/2023   OPHTHALMOLOGY EXAM  01/01/2024   Diabetic kidney evaluation - Urine ACR  02/19/2024   DEXA SCAN  03/23/2024    Colorectal cancer screening: No longer required.   Mammogram status: No longer required due to age.  Bone Density status: Ordered 03/27/2024. Pt provided with contact info and advised to call to schedule appt.  Lung Cancer Screening: (Low Dose CT Chest recommended if Age 106-80 years, 20 pack-year currently smoking OR have quit w/in 15years.) does not qualify.   Lung Cancer Screening Referral: n/a  Additional Screening:  Hepatitis C Screening: does qualify; Completed 05/15/2013  Vision Screening: Recommended annual ophthalmology exams for early detection of glaucoma and other disorders of the eye. Is the patient up to date with their annual eye exam?  Yes  Who is the provider or what is the name of the office in which the patient attends annual eye exams? [facility] If pt is not established with a provider, would they like to be referred to a provider to  establish care? N/a.   Dental Screening: Recommended annual dental exams for proper oral hygiene  Diabetic Foot Exam: Diabetic Foot Exam: Completed 08/27/2023  Community Resource Referral / Chronic Care Management: CRR required this visit?  No   CCM required this visit?  No     Plan:     I have personally reviewed and noted the following in the patient's chart:   Medical and social history Use of alcohol, tobacco or illicit drugs  Current medications and supplements including opioid prescriptions. Patient is not currently taking opioid prescriptions. Functional ability and status Nutritional status Physical activity Advanced directives List of other physicians Hospitalizations, surgeries, and ER visits in previous 12 months. None Vitals Screenings to include cognitive, depression, and falls Referrals and appointments  In addition, I have reviewed and discussed with patient certain preventive protocols, quality metrics, and best practice recommendations. A written personalized care plan for preventive services as well as general preventive health recommendations were provided to patient.     Bonny Jon Mayor, CMA   03/27/2024   After Visit Summary: (Mail) Due to this being a telephonic visit, the after visit summary with patients personalized plan was offered to patient via mail   Nurse Notes:   Hannah Rojas is a 78 y.o. female patient of [name], Dorothyann BIRCH, MD who had a Medicare Annual Wellness Visit today via telephone. Hannah Rojas is Working full time  and lives alone. She has 4 children. She reports that she is socially active and does interact with friends/family regularly. She is moderately physically active and enjoys playing with her granddaughter and work.

## 2024-04-05 ENCOUNTER — Other Ambulatory Visit: Payer: Self-pay | Admitting: Family Medicine

## 2024-04-05 DIAGNOSIS — F4329 Adjustment disorder with other symptoms: Secondary | ICD-10-CM

## 2024-04-07 ENCOUNTER — Other Ambulatory Visit: Payer: Medicare (Managed Care)

## 2024-04-07 NOTE — Progress Notes (Signed)
   04/07/2024  Patient ID: Hannah Rojas, female   DOB: [birth year], 78 y.o.   MRN: [id number]  Subjective/Objective Telephone visit to follow-up on management of diabetes   Diabetes management -Current medications: Ozempic  2 mg weekly -A1c on 3/24 was 6.3% -Endorses tolerating Ozempic  2 mg well with no adverse side effects; patient receives this medication through [facility] and just picked refills up from [facility] earlier today -Endorses checking fasting blood glucose regularly, values usually range 95-125; but patient states these have been somewhat elevated since illness from food poisoning approximately two weeks ago.  She does endorse values are beginning to come back down to her usual values -She does not endorse any signs or symptoms of hypoglycemia   Assessment/Plan   Diabetes management -Currently controlled -Continue current regimen, regular monitoring/recording of home blood glucose, and regular follow-up with PCP and Pharm.D -Patient sees PCP again 7/28 and will be due for A1c  Follow-up: 3 months   Channing DELENA Mealing, PharmD, DPLA

## 2024-04-14 ENCOUNTER — Ambulatory Visit (INDEPENDENT_AMBULATORY_CARE_PROVIDER_SITE_OTHER): Payer: Medicare (Managed Care) | Admitting: Family Medicine

## 2024-04-14 VITALS — BP 113/54 | HR 79 | Ht 60.0 in | Wt 120.2 lb

## 2024-04-14 DIAGNOSIS — F4329 Adjustment disorder with other symptoms: Secondary | ICD-10-CM | POA: Diagnosis not present

## 2024-04-14 DIAGNOSIS — M8580 Other specified disorders of bone density and structure, unspecified site: Secondary | ICD-10-CM | POA: Diagnosis not present

## 2024-04-14 DIAGNOSIS — S8012XA Contusion of left lower leg, initial encounter: Secondary | ICD-10-CM

## 2024-04-14 DIAGNOSIS — E1169 Type 2 diabetes mellitus with other specified complication: Secondary | ICD-10-CM

## 2024-04-14 DIAGNOSIS — E785 Hyperlipidemia, unspecified: Secondary | ICD-10-CM

## 2024-04-14 DIAGNOSIS — E119 Type 2 diabetes mellitus without complications: Secondary | ICD-10-CM | POA: Diagnosis not present

## 2024-04-14 DIAGNOSIS — N1831 Chronic kidney disease, stage 3a: Secondary | ICD-10-CM

## 2024-04-14 LAB — POCT UA - MICROALBUMIN
Creatinine, POC: 50 mg/dL
Microalbumin Ur, POC: 30 mg/L

## 2024-04-14 LAB — POCT GLYCOSYLATED HEMOGLOBIN (HGB A1C): Hemoglobin A1C: 6.2 % — AB (ref 4.0–5.6)

## 2024-04-14 MED ORDER — ESCITALOPRAM OXALATE 10 MG PO TABS: 10.0000 mg | ORAL_TABLET | Freq: Every day | ORAL | 3 refills | Status: AC

## 2024-04-14 NOTE — Assessment & Plan Note (Signed)
 Urine microalbumin performed today.  Continue to monitor renal function every 6 months.

## 2024-04-14 NOTE — Assessment & Plan Note (Signed)
 Okay overall.  Continue Lexapro  10 mg.

## 2024-04-14 NOTE — Assessment & Plan Note (Signed)
 Will get updated bone density test ordered.

## 2024-04-14 NOTE — Assessment & Plan Note (Signed)
 C looks fantastic today at 6.2 continue current regimen.  She has had some intermittent vomiting.  She does not feel like it is the semaglutide , she thinks she just ate some bad food.  Continue daily ACE inhibitor and Crestor .

## 2024-04-14 NOTE — Progress Notes (Signed)
 Established Patient Office Visit  Subjective  Patient ID: Hannah Rojas, female    DOB: [birth year]  Age: 78 y.o. MRN: [id number]  Chief Complaint  Patient presents with   Diabetes    4 month DM f/u visit   Hypertension    4 mth follow up     HPI Diabetes - no hypoglycemic events. No wounds or sores that are not healing well. No increased thirst or urination. Checking glucose at home. Taking medications as prescribed without any side effects.  F/u CKD 3 -no recent changes or concerns today.  She did 1 let me know in the last month she is actually vomited a couple times the first time was not long after she did not some barbecue at work.  The second time was after she had eaten some peaches mixed with cottage cheese.  She has not really had any abdominal pain or otherwise significant nausea.  No recent heartburn.  No abdominal pain no change in bowel movements.  She denies any constipation symptoms.  She also has  bruise on her left legm dog heard a fire cracker and the lease wrapped around her leg about 2 weeks agoa.      ROS    Objective:     BP (!) 113/54   Pulse 79   Ht 5' (1.524 m)   Wt 120 lb 3 oz (54.5 kg)   SpO2 100%   BMI 23.47 kg/m    Physical Exam Vitals and nursing note reviewed.  Constitutional:      Appearance: Normal appearance.  HENT:     Head: Normocephalic and atraumatic.  Eyes:     Conjunctiva/sclera: Conjunctivae normal.  Cardiovascular:     Rate and Rhythm: Normal rate and regular rhythm.  Pulmonary:     Effort: Pulmonary effort is normal.     Breath sounds: Normal breath sounds.  Skin:    General: Skin is warm and dry.  Neurological:     Mental Status: She is alert.  Psychiatric:        Mood and Affect: Mood normal.      Results for orders placed or performed in visit on 04/14/24  POCT HgB A1C  Result Value Ref Range   Hemoglobin A1C 6.2 (A) 4.0 - 5.6 %   HbA1c POC (<> result, manual entry)     HbA1c, POC (prediabetic  range)     HbA1c, POC (controlled diabetic range)    POCT UA - Microalbumin  Result Value Ref Range   Microalbumin Ur, POC 30 mg/L   Creatinine, POC 50 mg/dL   Albumin/Creatinine Ratio, Urine, POC 30-300       The 10-year ASCVD risk score (Arnett DK, et al., 2019) is: 36.2%    Assessment & Plan:   Problem List Items Addressed This Visit       Endocrine   Hyperlipidemia associated with type 2 diabetes mellitus (HCC)   Diabetes mellitus, type II (HCC) - Primary   C looks fantastic today at 6.2 continue current regimen.  She has had some intermittent vomiting.  She does not feel like it is the semaglutide , she thinks she just ate some bad food.  Continue daily ACE inhibitor and Crestor .      Relevant Orders   POCT HgB A1C (Completed)   POCT UA - Microalbumin (Completed)     Musculoskeletal and Integument   Osteopenia   Will get updated bone density test ordered.      Relevant Orders   DG  Bone Density     Genitourinary   KIDNEY DISEASE, CHRONIC, STAGE III   Urine microalbumin performed today.  Continue to monitor renal function every 6 months.      Relevant Orders   POCT HgB A1C (Completed)     Other   Stress and adjustment reaction   Okay overall.  Continue Lexapro  10 mg.      Relevant Medications   escitalopram  (LEXAPRO ) 10 MG tablet   Other Visit Diagnoses       Contusion of left lower leg, initial encounter          Superficial bruising on leg leg.  Recommend massage gently. Healing well.  Let us  know if any concerns as it continues to heal.  In regards to the vomiting it is not quite clear if this was just specific food triggers certainly the first time with barbecue it could have been a little greasy and greasy foods are usually not pretty well-tolerated with the GLP-1's but the second time it was peaches and cottage cheese.  If it continues then we may need to take a look at the medication she not currently having any symptoms.  Return in about 19  weeks (around 08/25/2024) for Diabetes follow-up.    Dorothyann Byars, MD

## 2024-05-05 DIAGNOSIS — H524 Presbyopia: Secondary | ICD-10-CM | POA: Diagnosis not present

## 2024-05-20 ENCOUNTER — Encounter: Payer: Self-pay | Admitting: Sports Medicine

## 2024-05-23 DIAGNOSIS — H524 Presbyopia: Secondary | ICD-10-CM | POA: Diagnosis not present

## 2024-06-20 DIAGNOSIS — B349 Viral infection, unspecified: Secondary | ICD-10-CM | POA: Diagnosis not present

## 2024-06-20 DIAGNOSIS — Z7984 Long term (current) use of oral hypoglycemic drugs: Secondary | ICD-10-CM | POA: Diagnosis not present

## 2024-06-20 DIAGNOSIS — R1010 Upper abdominal pain, unspecified: Secondary | ICD-10-CM | POA: Diagnosis not present

## 2024-06-20 DIAGNOSIS — Z7985 Long-term (current) use of injectable non-insulin antidiabetic drugs: Secondary | ICD-10-CM | POA: Diagnosis not present

## 2024-06-20 DIAGNOSIS — Z79899 Other long term (current) drug therapy: Secondary | ICD-10-CM | POA: Diagnosis not present

## 2024-06-20 DIAGNOSIS — R1012 Left upper quadrant pain: Secondary | ICD-10-CM | POA: Diagnosis not present

## 2024-06-20 DIAGNOSIS — E119 Type 2 diabetes mellitus without complications: Secondary | ICD-10-CM | POA: Diagnosis not present

## 2024-06-20 DIAGNOSIS — E278 Other specified disorders of adrenal gland: Secondary | ICD-10-CM | POA: Diagnosis not present

## 2024-06-20 DIAGNOSIS — Z9049 Acquired absence of other specified parts of digestive tract: Secondary | ICD-10-CM | POA: Diagnosis not present

## 2024-06-20 DIAGNOSIS — R112 Nausea with vomiting, unspecified: Secondary | ICD-10-CM | POA: Diagnosis not present

## 2024-06-20 DIAGNOSIS — Z9081 Acquired absence of spleen: Secondary | ICD-10-CM | POA: Diagnosis not present

## 2024-06-20 DIAGNOSIS — Z90411 Acquired partial absence of pancreas: Secondary | ICD-10-CM | POA: Diagnosis not present

## 2024-06-20 DIAGNOSIS — K5730 Diverticulosis of large intestine without perforation or abscess without bleeding: Secondary | ICD-10-CM | POA: Diagnosis not present

## 2024-06-20 DIAGNOSIS — I10 Essential (primary) hypertension: Secondary | ICD-10-CM | POA: Diagnosis not present

## 2024-06-20 DIAGNOSIS — R109 Unspecified abdominal pain: Secondary | ICD-10-CM | POA: Diagnosis not present

## 2024-06-23 ENCOUNTER — Ambulatory Visit (INDEPENDENT_AMBULATORY_CARE_PROVIDER_SITE_OTHER): Payer: Medicare (Managed Care) | Admitting: Urgent Care

## 2024-06-23 ENCOUNTER — Encounter: Payer: Self-pay | Admitting: Urgent Care

## 2024-06-23 VITALS — BP 117/76 | HR 84 | Ht 60.0 in | Wt 117.0 lb

## 2024-06-23 DIAGNOSIS — K219 Gastro-esophageal reflux disease without esophagitis: Secondary | ICD-10-CM

## 2024-06-23 DIAGNOSIS — K449 Diaphragmatic hernia without obstruction or gangrene: Secondary | ICD-10-CM | POA: Diagnosis not present

## 2024-06-23 DIAGNOSIS — R8290 Unspecified abnormal findings in urine: Secondary | ICD-10-CM | POA: Diagnosis not present

## 2024-06-23 LAB — POCT URINALYSIS DIP (CLINITEK)
Glucose, UA: NEGATIVE mg/dL
Nitrite, UA: POSITIVE — AB
POC PROTEIN,UA: >=300 — AB
Spec Grav, UA: 1.025 (ref 1.010–1.025)
Urobilinogen, UA: 1 U/dL
pH, UA: 5.5 (ref 5.0–8.0)

## 2024-06-23 MED ORDER — SUCRALFATE 1 G PO TABS: 1.0000 g | ORAL_TABLET | Freq: Four times a day (QID) | ORAL | 0 refills | Status: DC

## 2024-06-23 MED ORDER — PANTOPRAZOLE SODIUM 40 MG PO TBEC: 40.0000 mg | DELAYED_RELEASE_TABLET | Freq: Every day | ORAL | 3 refills | Status: DC

## 2024-06-23 NOTE — Patient Instructions (Signed)
 Please start taking pantoprazole once daily - take 30 min before food in the morning. Please also take sucralfate four times daily - 1 hour before or two hours after eating.  Please return for a further evaluation and workup if treatment today is ineffective.

## 2024-06-23 NOTE — Progress Notes (Signed)
 Established Patient Office Visit  Subjective:  Patient ID: Hannah Rojas, female    DOB: [birth year]  Age: 78 y.o. MRN: [id number]  Chief Complaint  Patient presents with   Nausea    And vomit x 4 days; ER F/U    HPI  Discussed the use of AI scribe software for clinical note transcription with the patient, who gave verbal consent to proceed.  History of Present Illness   Hannah Rojas is a 78 year old female who presents with persistent nausea and vomiting. She is accompanied by her daughter-in-law.  She has been experiencing persistent nausea and vomiting that began on Friday or Saturday prior to her emergency room visit on October 3rd. The nausea comes in waves, accompanied by a bitter taste in her mouth and a sensation of heartburn, but no abdominal pain. She describes a sensation of stomach acid rising, but no actual vomiting of food particles. Despite the nausea, she denies any bloating or distension.  Her bowel movements were abnormal between her ER visit and yesterday, but she had a normal bowel movement yesterday. She has not had a bowel movement today. She has not vomited since taking Zofran , although she still experiences occasional nausea.  She has been avoiding eating due to fear of exacerbating her symptoms, but has been consuming light foods like applesauce and drinking Sprite. She denies any urinary symptoms despite an abnormal urine sample indicating E. coli infection from her ER visit.  Her current medications include Ozempic  at a 2 mg dose for blood sugar management, which she has been taking consistently without missing doses. She denies any previous stomach issues related to Ozempic . She is also on medications for cholesterol, blood pressure, and Lexapro .       Patient Active Problem List   Diagnosis Date Noted   Osteopenia 04/14/2024   Primary insomnia 05/28/2023   Left elbow pain 01/08/2023   Injury of left shoulder 01/08/2023   Fall 01/08/2023    Stress and adjustment reaction 10/31/2021   Seasonal allergies 03/29/2020   History of iron deficiency 03/29/2020   Hyperlipidemia associated with type 2 diabetes mellitus (HCC) 03/13/2016   BMI 24.0-24.9, adult 11/19/2013   History of splenectomy 01/06/2013   Oral lesion - Fibroma/Reactive Changes to tounge 08/07/2012   Pancreatic cyst 07/12/2011   Diabetes mellitus, type II (HCC) 05/17/2007   KIDNEY DISEASE, CHRONIC, STAGE III 05/17/2007   Abnormal levels of other serum enzymes 05/16/2007   CARRIER, 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 pylori infection    history   Pancreatic cyst    hx mucinous cystoadenoma   Past Surgical History:  Procedure Laterality Date   CHOLECYSTECTOMY     ESOPHAGOGASTRODUODENOSCOPY     PANCREATIC CYST EXCISION     spleenectomy     Social History   Tobacco Use   Smoking status: Never   Smokeless tobacco: Never  Substance Use Topics   Alcohol use: No   Drug use: Never      ROS: as noted in HPI  Objective:     BP 117/76   Pulse 84   Ht 5' (1.524 m)   Wt 117 lb (53.1 kg)   SpO2 96%   BMI 22.85 kg/m  BP Readings from Last 3 Encounters:  06/23/24 117/76  04/14/24 (!) 113/54  12/10/23 (!) 117/57   Wt Readings  from Last 3 Encounters:  06/23/24 117 lb (53.1 kg)  04/14/24 120 lb 3 oz (54.5 kg)  03/27/24 130 lb (59 kg)      Physical Exam Vitals and nursing note reviewed. Exam conducted with a chaperone present.  Constitutional:      General: She is not in acute distress.    Appearance: Normal appearance. She is normal weight. She is not ill-appearing, toxic-appearing or diaphoretic.  HENT:     Right Ear: External ear normal.     Left Ear: External ear normal.     Mouth/Throat:     Mouth: Mucous membranes are moist.     Pharynx: No oropharyngeal exudate or posterior oropharyngeal erythema.  Eyes:     General: No  scleral icterus.       Right eye: No discharge.        Left eye: No discharge.  Cardiovascular:     Rate and Rhythm: Normal rate.  Pulmonary:     Effort: Pulmonary effort is normal. No respiratory distress.     Breath sounds: Normal breath sounds. No stridor. No wheezing or rhonchi.  Abdominal:     General: Abdomen is flat. Bowel sounds are normal. There is no distension.     Palpations: Abdomen is soft. There is no mass.     Tenderness: There is no abdominal tenderness. There is no right CVA tenderness, left CVA tenderness, guarding or rebound.  Musculoskeletal:     Cervical back: Normal range of motion and neck supple. No rigidity or tenderness.  Lymphadenopathy:     Cervical: No cervical adenopathy.  Skin:    General: Skin is warm and dry.     Coloration: Skin is not jaundiced.     Findings: No bruising, erythema or rash.  Neurological:     General: No focal deficit present.     Mental Status: She is alert and oriented to person, place, and time.     Motor: No weakness.  Psychiatric:        Behavior: Behavior normal.      Results for orders placed or performed in visit on 06/23/24  POCT URINALYSIS DIP (CLINITEK)  Result Value Ref Range   Color, UA yellow yellow   Clarity, UA clear clear   Glucose, UA negative negative mg/dL   Bilirubin, UA small (A) negative   Ketones, POC UA moderate (40) (A) negative mg/dL   Spec Grav, UA 8.974 8.989 - 1.025   Blood, UA moderate (A) negative   pH, UA 5.5 5.0 - 8.0   POC PROTEIN,UA >=300 (A) negative, trace   Urobilinogen, UA 1.0 0.2 or 1.0 E.U./dL   Nitrite, UA Positive (A) Negative   Leukocytes, UA Small (1+) (A) Negative    Last CBC Lab Results  Component Value Date   WBC 9.5 08/14/2022   HGB 13.9 08/14/2022   HCT 40.7 08/14/2022   MCV 91.9 08/14/2022   MCH 31.4 08/14/2022   RDW 11.9 08/14/2022   PLT 274 08/14/2022   Last metabolic panel Lab Results  Component Value Date   GLUCOSE 99 12/10/2023   NA 141 12/10/2023    K 4.3 12/10/2023   CL 103 12/10/2023   CO2 26 12/10/2023   BUN 15 12/10/2023   CREATININE 0.79 12/10/2023   EGFR 77 12/10/2023   CALCIUM  9.4 12/10/2023   PROT 6.4 12/10/2023   ALBUMIN 4.0 12/10/2023   LABGLOB 2.4 12/10/2023   BILITOT 0.6 12/10/2023   ALKPHOS 73 12/10/2023   AST 16 12/10/2023  ALT 11 12/10/2023   Last lipids Lab Results  Component Value Date   CHOL 150 12/10/2023   HDL 61 12/10/2023   LDLCALC 73 12/10/2023   LDLDIRECT 107 (H) 05/27/2014   TRIG 86 12/10/2023   CHOLHDL 1.6 11/20/2022   Last hemoglobin A1c Lab Results  Component Value Date   HGBA1C 6.2 (A) 04/14/2024   Last thyroid  functions Lab Results  Component Value Date   TSH 2.18 03/29/2020   Last vitamin D No results found for: 25OHVITD2, 25OHVITD3, VD25OH Last vitamin B12 and Folate Lab Results  Component Value Date   VITAMINB12 556 03/29/2020      The 10-year ASCVD risk score (Arnett DK, et al., 2019) is: 38.3%  Assessment & Plan:  Hiatal hernia with GERD -     Pantoprazole Sodium; Take 1 tablet (40 mg total) by mouth daily.  Dispense: 30 tablet; Refill: 3 -     Sucralfate; Take 1 tablet (1 g total) by mouth 4 (four) times daily for 8 days.  Dispense: 32 tablet; Refill: 0  Abnormal urine -     Urine Culture -     POCT URINALYSIS DIP (CLINITEK)  Assessment and Plan    Hiatal hernia with associated nausea and vomiting Hiatal hernia confirmed on CT scan, likely causing nausea, vomiting, heartburn, and foul taste. Zofran  effective for vomiting. Hiatal hernia suspected as primary cause. - Prescribe sucralfate for short-term symptom relief. - Prescribe morning acid-reducing medication. - Advise return for evaluation if symptoms persist before December. - Recommend abdominal massage and bicycle exercise.  Type 2 diabetes mellitus Type 2 diabetes managed with Ozempic  2 mg. No missed doses or gastrointestinal issues previously reported. Should nausea persist despite  recommendations, consider DC medication.  Asymptomatic bacteriuria Urine culture showed E. coli infection, but no urinary symptoms reported. Asymptomatic bacteriuria suspected. - Recheck urine sample / culture         No follow-ups on file.   Benton LITTIE Gave, PA

## 2024-06-25 ENCOUNTER — Ambulatory Visit: Payer: Self-pay | Admitting: Urgent Care

## 2024-06-25 ENCOUNTER — Other Ambulatory Visit: Payer: Medicare (Managed Care)

## 2024-06-25 DIAGNOSIS — R8290 Unspecified abnormal findings in urine: Secondary | ICD-10-CM

## 2024-06-25 LAB — URINE CULTURE

## 2024-06-25 MED ORDER — CIPROFLOXACIN HCL 250 MG PO TABS: 250.0000 mg | ORAL_TABLET | Freq: Two times a day (BID) | ORAL | 0 refills | Status: AC

## 2024-06-26 ENCOUNTER — Telehealth: Payer: Self-pay

## 2024-06-26 MED ORDER — NITROFURANTOIN MONOHYD MACRO 100 MG PO CAPS: 100.0000 mg | ORAL_CAPSULE | Freq: Two times a day (BID) | ORAL | 0 refills | Status: DC

## 2024-06-26 NOTE — Telephone Encounter (Signed)
 Called patient and left a detailed voice mail message on listed home #

## 2024-06-26 NOTE — Telephone Encounter (Signed)
 Copied from CRM [id number]. Topic: Clinical - Medication Question >> Jun 26, 2024  3:28 PM Lauren C wrote: Reason for CRM: Daughter in law Kaitlin calling to see if pt is supposed to be taking the new cipro medication with her sucralfate. She says she sees something saying to not take them together. Please reach out to them at [phone]. Ayako is with her and gave verbal approval on this call to speak with Kaitlin on her behalf.

## 2024-06-26 NOTE — Telephone Encounter (Signed)
 I have sent them two messages on Mychart, maybe they dont read them. She is NOT to take the cupro as her urine cx was resistant to it. Instead she is to take the macrobid.

## 2024-06-30 NOTE — Telephone Encounter (Signed)
 Spoke with patient.informed that should not be taking Cipro but should be taking Macrobid. Reviewed the medication list with patient.  She states she feels that she now is understanding how to take her medication correctly.  She will contact us  if has any questions.

## 2024-07-08 ENCOUNTER — Other Ambulatory Visit: Payer: Medicare (Managed Care)

## 2024-07-08 DIAGNOSIS — E119 Type 2 diabetes mellitus without complications: Secondary | ICD-10-CM

## 2024-07-08 DIAGNOSIS — E1169 Type 2 diabetes mellitus with other specified complication: Secondary | ICD-10-CM

## 2024-07-08 NOTE — Progress Notes (Unsigned)
   07/08/2024  Patient ID: Hannah Rojas, female   DOB: 10-Oct-1945, 78 y.o.   MRN: [id number]  Subjective/Objective Telephone visit to follow-up on management of diabetes   Diabetes management -Current medications: Ozempic  2 mg weekly -A1c on 3/24 was 6.3% -Endorses tolerating Ozempic  2 mg well with no adverse side effects; patient receives this medication through [facility] and just picked refills up from [facility] earlier today -Endorses checking fasting blood glucose regularly, values usually range 95-125; but patient states these have been somewhat elevated since illness from food poisoning approximately two weeks ago.  She does endorse values are beginning to come back down to her usual values -She does not endorse any signs or symptoms of hypoglycemia   Assessment/Plan   Diabetes management -Currently controlled -Continue current regimen, regular monitoring/recording of home blood glucose, and regular follow-up with PCP and Pharm.D -Patient sees PCP again 7/28 and will be due for A1c  Follow-up: 3 months   Hannah Rojas, PharmD, DPLA

## 2024-07-09 ENCOUNTER — Telehealth: Payer: Self-pay

## 2024-07-09 ENCOUNTER — Telehealth: Payer: Self-pay | Admitting: Family Medicine

## 2024-07-09 NOTE — Telephone Encounter (Signed)
 Completed refill form for Ozempic  and faxed to provider for review and signature.

## 2024-07-09 NOTE — Telephone Encounter (Signed)
 Please call pt and see if wants to schedule for her flu shot. I know she ws in for acute visit but wsn't sure if she wanted to get her flu shot

## 2024-07-10 NOTE — Telephone Encounter (Signed)
 Patient is scheduled for her flu vaccine 07/15/24

## 2024-07-14 ENCOUNTER — Ambulatory Visit (INDEPENDENT_AMBULATORY_CARE_PROVIDER_SITE_OTHER): Payer: Medicare (Managed Care)

## 2024-07-14 DIAGNOSIS — Z23 Encounter for immunization: Secondary | ICD-10-CM

## 2024-07-24 ENCOUNTER — Telehealth: Payer: Self-pay

## 2024-07-24 NOTE — Telephone Encounter (Signed)
 Received  Ozempic  2mg   ( 8mg /72ml)  3 boxes ( one pen per box ) form patient assistance.  Patient informed and will pick this up at her convenience.

## 2024-08-25 ENCOUNTER — Ambulatory Visit: Payer: Medicare (Managed Care) | Admitting: Family Medicine

## 2024-08-25 ENCOUNTER — Encounter: Payer: Self-pay | Admitting: Family Medicine

## 2024-08-25 VITALS — BP 112/58 | HR 74 | Ht 61.0 in | Wt 120.0 lb

## 2024-08-25 DIAGNOSIS — E1169 Type 2 diabetes mellitus with other specified complication: Secondary | ICD-10-CM

## 2024-08-25 DIAGNOSIS — K449 Diaphragmatic hernia without obstruction or gangrene: Secondary | ICD-10-CM | POA: Diagnosis not present

## 2024-08-25 DIAGNOSIS — E119 Type 2 diabetes mellitus without complications: Secondary | ICD-10-CM

## 2024-08-25 DIAGNOSIS — E1122 Type 2 diabetes mellitus with diabetic chronic kidney disease: Secondary | ICD-10-CM | POA: Diagnosis not present

## 2024-08-25 DIAGNOSIS — E785 Hyperlipidemia, unspecified: Secondary | ICD-10-CM | POA: Diagnosis not present

## 2024-08-25 DIAGNOSIS — K219 Gastro-esophageal reflux disease without esophagitis: Secondary | ICD-10-CM | POA: Diagnosis not present

## 2024-08-25 DIAGNOSIS — Z7985 Long-term (current) use of injectable non-insulin antidiabetic drugs: Secondary | ICD-10-CM | POA: Diagnosis not present

## 2024-08-25 DIAGNOSIS — B182 Chronic viral hepatitis C: Secondary | ICD-10-CM | POA: Diagnosis not present

## 2024-08-25 DIAGNOSIS — N1831 Chronic kidney disease, stage 3a: Secondary | ICD-10-CM

## 2024-08-25 LAB — POCT GLYCOSYLATED HEMOGLOBIN (HGB A1C): Hemoglobin A1C: 6.2 % — AB (ref 4.0–5.6)

## 2024-08-25 NOTE — Progress Notes (Signed)
 Established Patient Office Visit  Patient ID: Hannah Rojas, female    DOB: [birth year]  Age: 78 y.o. MRN: [id number] PCP: Alvan Dorothyann BIRCH, MD  Chief Complaint  Patient presents with   Diabetes    Subjective:     HPI  Discussed the use of AI scribe software for clinical note transcription with the patient, who gave verbal consent to proceed.  History of Present Illness Hannah Rojas is a 78 year old female who presents for a follow-up visit regarding her current medications and overall health management.  DM/Glycemic control - A1c is 6.2%. - Diabetes managed with injections. - No constipation related to diabetes medication.  Hyperlipidemia management - Taking cholesterol medication without concerns.  Mood stability - Mood is stable. - Lexapro  is effective for mood management.  Gastroesophageal reflux symptoms - Prescribed 40 mg reflux medication during prior hospitalization. - Not taking reflux medication regularly. - Manages symptoms with dietary adjustments. - Carries medication but has not needed to use it recently.  Ophthalmologic evaluation - Recent eye exam performed at [facility]. - Attempting to obtain report for records.  Weight changes - Recent weight loss. - Monitoring weight. - Coworkers have commented on weight loss.     ROS    Objective:     BP (!) 112/58   Pulse 74   Ht 5' 1 (1.549 m)   Wt 120 lb 0.6 oz (54.4 kg)   SpO2 100%   BMI 22.68 kg/m     Physical Exam Constitutional:      Appearance: Normal appearance.  HENT:     Head: Normocephalic and atraumatic.     Right Ear: Tympanic membrane, ear canal and external ear normal.     Left Ear: Tympanic membrane, ear canal and external ear normal.     Nose: Nose normal.     Mouth/Throat:     Pharynx: Oropharynx is clear.  Eyes:     Extraocular Movements: Extraocular movements intact.     Conjunctiva/sclera: Conjunctivae normal.     Pupils: Pupils are equal, round,  and reactive to light.  Neck:     Thyroid : No thyromegaly.  Cardiovascular:     Rate and Rhythm: Normal rate and regular rhythm.  Pulmonary:     Effort: Pulmonary effort is normal.     Breath sounds: Normal breath sounds.  Abdominal:     General: Bowel sounds are normal.     Palpations: Abdomen is soft.     Tenderness: There is no abdominal tenderness.  Musculoskeletal:        General: No swelling.     Cervical back: Neck supple.  Skin:    General: Skin is warm and dry.  Neurological:     Mental Status: She is oriented to person, place, and time.  Psychiatric:        Mood and Affect: Mood normal.        Behavior: Behavior normal.      Results for orders placed or performed in visit on 08/25/24  POCT HgB A1C  Result Value Ref Range   Hemoglobin A1C 6.2 (A) 4.0 - 5.6 %   HbA1c POC (<> result, manual entry)     HbA1c, POC (prediabetic range)     HbA1c, POC (controlled diabetic range)         The 10-year ASCVD risk score (Arnett DK, et al., 2019) is: 35.7%    Assessment & Plan:   Problem List Items Addressed This Visit  Endocrine   Hyperlipidemia associated with type 2 diabetes mellitus (HCC)   Diabetes mellitus, type II (HCC) - Primary   Relevant Orders   POCT HgB A1C (Completed)     Genitourinary   KIDNEY DISEASE, CHRONIC, STAGE III   Other Visit Diagnoses       Hiatal hernia with GERD           Assessment and Plan Assessment & Plan Type 2 diabetes mellitus Well-controlled with an A1c of 6.2%.  Mixed hyperlipidemia Managed with a cholesterol medication, which she tolerates well.  Diaphragmatic hernia with gastroesophageal reflux disease Discussed risks of long-term high-dose reflux medication, including increased fracture risk. Advised tapering off to avoid rebound reflux. She manages well with dietary modifications. - Removed reflux medication from medication list. - Use remaining medication as needed for indigestion.  Stress and  adjustment reaction Mood stable.  General Health Maintenance Eye exam report to be obtained and added to the system. - Obtain and add eye exam report to the system.    Return in about 4 months (around 12/24/2024) for Diabetes follow-up.    Dorothyann Byars, MD [facility] at [facility]

## 2024-08-27 DIAGNOSIS — K219 Gastro-esophageal reflux disease without esophagitis: Secondary | ICD-10-CM | POA: Insufficient documentation

## 2024-08-27 NOTE — Assessment & Plan Note (Signed)
° °  Continue to monior Q 6 months and avoid NSAIDS

## 2024-08-27 NOTE — Assessment & Plan Note (Signed)
 No active disease

## 2024-09-03 ENCOUNTER — Other Ambulatory Visit: Payer: Medicare (Managed Care)

## 2024-09-03 DIAGNOSIS — Z780 Asymptomatic menopausal state: Secondary | ICD-10-CM | POA: Diagnosis not present

## 2024-09-03 DIAGNOSIS — M8580 Other specified disorders of bone density and structure, unspecified site: Secondary | ICD-10-CM

## 2024-09-03 DIAGNOSIS — M8589 Other specified disorders of bone density and structure, multiple sites: Secondary | ICD-10-CM

## 2024-09-09 ENCOUNTER — Ambulatory Visit: Payer: Self-pay | Admitting: Family Medicine

## 2024-09-09 NOTE — Progress Notes (Signed)
 Hi Hannah Rojas, your bone density shows a T-score of -2.1 in the right hip.  There has been a slight decline by about -6.7%.  T-score is still consistent with osteopenia.   The current recommendation for osteopenia (mildly thin bones) treatment includes:   #1 calcium -total of 1200 mg of calcium  daily.  If you eat a very calcium  rich diet you may be able to obtain that without a supplement.  If not, then I recommend calcium  500 mg twice a day.  There are several products over-the-counter such as Caltrate D and Viactiv chews which are great options that contain calcium  and vitamin D. #2 vitamin D-recommend 800 international units daily. #3 exercise-recommend 30 minutes of weightbearing exercise 3 days a week.  Resistance training ,such as doing bands and light weights, can be particularly helpful.

## 2024-12-22 ENCOUNTER — Ambulatory Visit: Payer: Medicare (Managed Care) | Admitting: Family Medicine

## 2025-04-02 ENCOUNTER — Ambulatory Visit: Payer: Medicare (Managed Care)
